# Patient Record
Sex: Male | Born: 1960
Health system: Southern US, Community
[De-identification: ages and names within clinical notes are randomized; demographics above are authoritative.]

## PROBLEM LIST (undated history)

## (undated) DIAGNOSIS — Z21 Asymptomatic human immunodeficiency virus [HIV] infection status: Secondary | ICD-10-CM

## (undated) DIAGNOSIS — I2510 Atherosclerotic heart disease of native coronary artery without angina pectoris: Secondary | ICD-10-CM

## (undated) DIAGNOSIS — N200 Calculus of kidney: Secondary | ICD-10-CM

## (undated) DIAGNOSIS — E785 Hyperlipidemia, unspecified: Secondary | ICD-10-CM

## (undated) DIAGNOSIS — IMO0002 Reserved for concepts with insufficient information to code with codable children: Secondary | ICD-10-CM

## (undated) DIAGNOSIS — G510 Bell's palsy: Secondary | ICD-10-CM

## (undated) DIAGNOSIS — N529 Male erectile dysfunction, unspecified: Secondary | ICD-10-CM

## (undated) DIAGNOSIS — I219 Acute myocardial infarction, unspecified: Secondary | ICD-10-CM

## (undated) DIAGNOSIS — C4491 Basal cell carcinoma of skin, unspecified: Secondary | ICD-10-CM

## (undated) HISTORY — DX: Bell's palsy: G51.0

## (undated) HISTORY — DX: Atherosclerotic heart disease of native coronary artery without angina pectoris: I25.10

## (undated) HISTORY — DX: Reserved for concepts with insufficient information to code with codable children: IMO0002

## (undated) HISTORY — PX: CARDIAC CATHETERIZATION: SHX172

## (undated) HISTORY — PX: OTHER SURGICAL HISTORY: SHX169

## (undated) HISTORY — DX: Basal cell carcinoma of skin, unspecified: C44.91

## (undated) HISTORY — DX: Asymptomatic human immunodeficiency virus (hiv) infection status: Z21

## (undated) HISTORY — DX: Hyperlipidemia, unspecified: E78.5

## (undated) HISTORY — DX: Calculus of kidney: N20.0

## (undated) HISTORY — DX: Male erectile dysfunction, unspecified: N52.9

---

## 1983-04-08 HISTORY — PX: AMPUTATION: SHX166

## 2001-01-05 DIAGNOSIS — IMO0002 Reserved for concepts with insufficient information to code with codable children: Secondary | ICD-10-CM

## 2001-01-05 HISTORY — DX: Reserved for concepts with insufficient information to code with codable children: IMO0002

## 2001-06-05 HISTORY — PX: CHOLECYSTECTOMY: SHX55

## 2002-04-07 DIAGNOSIS — I219 Acute myocardial infarction, unspecified: Secondary | ICD-10-CM

## 2002-04-07 DIAGNOSIS — G510 Bell's palsy: Secondary | ICD-10-CM

## 2002-04-07 HISTORY — DX: Bell's palsy: G51.0

## 2002-04-07 HISTORY — DX: Acute myocardial infarction, unspecified: I21.9

## 2003-04-17 ENCOUNTER — Other Ambulatory Visit: Payer: Self-pay

## 2003-04-18 ENCOUNTER — Other Ambulatory Visit: Payer: Self-pay

## 2006-06-12 ENCOUNTER — Ambulatory Visit: Payer: Self-pay | Admitting: Internal Medicine

## 2007-09-10 ENCOUNTER — Ambulatory Visit: Payer: Self-pay | Admitting: Internal Medicine

## 2007-09-10 ENCOUNTER — Other Ambulatory Visit: Payer: Self-pay

## 2007-12-23 ENCOUNTER — Ambulatory Visit: Payer: Self-pay | Admitting: Specialist

## 2008-04-21 ENCOUNTER — Ambulatory Visit: Payer: Self-pay | Admitting: Internal Medicine

## 2008-04-21 ENCOUNTER — Encounter: Payer: Self-pay | Admitting: Cardiovascular Disease

## 2008-07-17 ENCOUNTER — Ambulatory Visit: Payer: Self-pay | Admitting: Internal Medicine

## 2008-07-18 ENCOUNTER — Ambulatory Visit: Payer: Self-pay | Admitting: Internal Medicine

## 2008-07-18 ENCOUNTER — Encounter: Payer: Self-pay | Admitting: Cardiovascular Disease

## 2008-10-03 ENCOUNTER — Emergency Department: Payer: Self-pay | Admitting: Emergency Medicine

## 2009-05-12 ENCOUNTER — Ambulatory Visit: Payer: Self-pay | Admitting: Internal Medicine

## 2009-05-13 ENCOUNTER — Ambulatory Visit: Payer: Self-pay | Admitting: Internal Medicine

## 2009-09-21 ENCOUNTER — Ambulatory Visit: Payer: Self-pay | Admitting: Internal Medicine

## 2009-11-23 ENCOUNTER — Ambulatory Visit: Payer: Self-pay | Admitting: Cardiovascular Disease

## 2009-11-23 DIAGNOSIS — I2510 Atherosclerotic heart disease of native coronary artery without angina pectoris: Secondary | ICD-10-CM | POA: Insufficient documentation

## 2009-11-23 DIAGNOSIS — R079 Chest pain, unspecified: Secondary | ICD-10-CM | POA: Insufficient documentation

## 2009-11-23 DIAGNOSIS — E785 Hyperlipidemia, unspecified: Secondary | ICD-10-CM | POA: Insufficient documentation

## 2009-11-25 DIAGNOSIS — B20 Human immunodeficiency virus [HIV] disease: Secondary | ICD-10-CM | POA: Insufficient documentation

## 2010-01-20 ENCOUNTER — Ambulatory Visit: Payer: Self-pay | Admitting: Internal Medicine

## 2010-02-25 ENCOUNTER — Encounter: Payer: Self-pay | Admitting: Cardiovascular Disease

## 2010-02-26 ENCOUNTER — Encounter: Payer: Self-pay | Admitting: Cardiovascular Disease

## 2010-03-15 ENCOUNTER — Ambulatory Visit: Payer: Self-pay | Admitting: Cardiovascular Disease

## 2010-03-15 ENCOUNTER — Encounter: Payer: Self-pay | Admitting: Cardiovascular Disease

## 2010-04-25 ENCOUNTER — Encounter: Payer: Self-pay | Admitting: Cardiovascular Disease

## 2010-05-07 NOTE — Letter (Signed)
Summary: PHI  PHI   Imported By: Harlon Flor 11/26/2009 15:18:44  _____________________________________________________________________  External Attachment:    Type:   Image     Comment:   External Document

## 2010-05-07 NOTE — Cardiovascular Report (Signed)
[facility]   Imported By: Harlon Flor 11/26/2009 15:17:11  _____________________________________________________________________  External Attachment:    Type:   Image     Comment:   External Document

## 2010-05-07 NOTE — Miscellaneous (Signed)
Summary: Cardiac enzymes  Clinical Lists Changes  Orders: Added new Test order of T-CK Total [phone]) - Signed Added new Test order of T-CK MB Fract ([id number]) - Signed Added new Test order of T-Troponin I ([id number]) - Signed     Dr. Mariah Milling saw pt today in hospital, cardiac enzymes were normal.  Pt is now pale, diaphoretic, c/o chest pain. CK CK-MB, Troponin ordered and faxed to pt at [facility] and tosend results to Dr. Mariah Milling. Pt is an employee at [employer].  Appended Document: Cardiac enzymes Second set of cardiac enzymes negative. Dr. Mariah Milling spoke to pt. Dr. Mariah Milling instructed pt to increase fluids, rest, take Tylenol as needed for HA, if symptoms worsen to go to ED.

## 2010-05-07 NOTE — Miscellaneous (Signed)
Summary: Orders Update  Clinical Lists Changes  Orders: Added new Test order of T-Testosterone, Free and Total [phone]) - Signed

## 2010-05-07 NOTE — Assessment & Plan Note (Signed)
Summary: NEW PT   Visit Type:  Initial Consult Primary Provider:  Micheal E. Blocker, MD  CC:  c/o while riding his bike had some mid-sternum chest pain.Marland Kitchen  History of Present Illness: Daniel Black is a very pleasant 50 year old gentleman with coronary artery disease, MI in 2004 with PCI to the LAD and mid RCA at that time, PCI in 2010 495% mid RCA lesion, hyperlipidemia, HIV who is on antivirals with a strong family history of coronary artery disease who presents to establish care.  He states that currently he is doing well. He is an avid biker and exercises daily. He did have an episode of chest pain 2 weeks ago going up a hill and had to get off his bike and pushed the bike. He has been biking since then with no significant chest pain. He does not travel with nitroglycerin when he bikes. He episode of chest pain was not associated with diaphoresis or lightheadedness.  EKG shows normal sinus rhythm with rate 69 beats per minute, no significant ST or T wave changes.  Cardiac catheterization report from April 2010 details patent LAD stent, 30% distal LAD, 25% proximal left circumflex, 20% mid circumflex, 95% mid RCA, 40% distal RCA, 30% right PDA. Xience 3.5 x 28 mm DES stent placed to the mid RCA.  Stress test from January 2010 showed no ischemia.  Current Medications (verified): 1)  Crestor 40 Mg Tabs (Rosuvastatin Calcium) .... Take One Tablet By Mouth Daily. 2)  Atripla 600-200-300 Mg Tabs (Efavirenz-Emtricitab-Tenofovir) .Marland Kitchen.. 1 Tablet Daily 3)  Plavix 75 Mg Tabs (Clopidogrel Bisulfate) .... Take One Tablet By Mouth Daily 4)  Aspirin Ec 325 Mg Tbec (Aspirin) .... Take One Tablet By Mouth Daily 5)  Fish Oil 1000 Mg Caps (Omega-3 Fatty Acids) .Marland Kitchen.. 1 Tablet, Three Times A Day 6)  Flax Seed Oil 1000 Mg Caps (Flaxseed (Linseed)) .Marland Kitchen.. 1 Tablet Three Times A Day  Allergies (verified): 1)  ! Lipitor (Atorvastatin Calcium)  Past History:  Past Surgical History: Last updated:  11/19/2009 Amputation; left fingers reattachment 1985  Family History: Last updated: 11/23/2009 Family History of Coronary Artery Disease:  Father CABG x 3  Social History: Last updated: 11/23/2009 Single  Domestic Partner  Tobacco Use - Former.  Regular Exercise - yes--riding, walk & run. Alcohol Use - yes--occas. 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  Regular Exercise - yes--riding, walk & run. Alcohol Use - yes--occas. 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         remote chest pain 2 weeks ago, none since  Vital Signs:  Patient profile:   50 year old male Height:      70 inches Weight:      189 pounds BMI:     27.22 Pulse rate:   69 / minute BP sitting:   136 / 86  (right arm) Cuff size:   regular  Vitals Entered By: Jasmine December  Johnna Acosta, CMA (November 23, 2009 2:58 PM)  Physical Exam  General:  Well developed, well nourished, in no acute distress. Head:  normocephalic and atraumatic Neck:  Neck supple, no JVD. No masses, thyromegaly or abnormal cervical nodes. Lungs:  Clear bilaterally to auscultation and percussion. Heart:  Non-displaced PMI, chest non-tender; regular rate and rhythm, S1, S2 without murmurs,  rubs or gallops. Carotid upstroke normal, no bruit. Normal abdominal aortic size, no bruits. . Pedals normal pulses. No edema, no varicosities. Abdomen:   abdomen soft and non-tender without masses Msk:  Back normal, normal gait. Muscle strength and tone normal. Pulses:  pulses normal in all 4 extremities Extremities:  No clubbing or cyanosis. Neurologic:  Alert and oriented x 3. Skin:  Intact without lesions or rashes. Psych:  Normal affect.   Impression & Recommendations:  Problem # 1:  CAD (ICD-414.00) recent episode of chest pain with exertion 2 weeks ago though none since then. We will give him nitroglycerin to travel with. We have also suggested that if he continues to have chest pain, that he contact us for further evaluation. If he has symptoms concerning for coronary artery disease, we would likely suggest to perform a cardiac catheterization as his stress test did not pick up his severe lesion in his RCA several months prior to intervention.  His updated medication list for this problem includes:    Plavix 75 Mg Tabs (Clopidogrel bisulfate) .Marland Kitchen... Take one tablet by mouth daily    Aspirin Ec 325 Mg Tbec (Aspirin) .Marland Kitchen... Take one tablet by mouth daily  Orders: EKG w/ Interpretation (93000)  Problem # 2:  HYPERLIPIDEMIA-MIXED (ICD-272.4) We'll continue aggressive lipid management. Currently he is at goal.  His updated medication list for this problem includes:    Crestor 40 Mg Tabs (Rosuvastatin calcium) .Marland Kitchen... Take one tablet by mouth daily.  Problem # 3:  HIV INFECTION (ICD-042) HIV management I. Dr. Leavy Cella. Notes indicate CD4 count 950 and viral load 230 in February of this year. No significant opportunistic infections.

## 2010-05-07 NOTE — Letter (Signed)
Summary: Stent Implantation Card  Stent Implantation Card   Imported By: Harlon Flor 11/26/2009 09:47:56  _____________________________________________________________________  External Attachment:    Type:   Image     Comment:   External Document

## 2010-05-09 NOTE — Miscellaneous (Signed)
Summary: Rx Refill  Clinical Lists Changes  Medications: Changed medication from EFFIENT 10 MG TABS (PRASUGREL HCL) one tablet once daily to PLAVIX 75 MG TABS (CLOPIDOGREL BISULFATE) Take one tablet by mouth daily - Signed Rx of CRESTOR 40 MG TABS (ROSUVASTATIN CALCIUM) Take one tablet by mouth daily.;  #90 x 4;  Signed;  Entered by: Lanny Hurst RN;  Authorized by: Dossie Arbour MD;  Method used: Electronically to [facility] [address], [address], [place], [place]  [address], Ph: [phone], Fax: [phone] Rx of PLAVIX 75 MG TABS (CLOPIDOGREL BISULFATE) Take one tablet by mouth daily;  #90 x 3;  Signed;  Entered by: Lanny Hurst RN;  Authorized by: Dossie Arbour MD;  Method used: Electronically to [facility] [address] [place], [place], [place]  [address], Ph: [phone], Fax: [phone]    Prescriptions: PLAVIX 75 MG TABS (CLOPIDOGREL BISULFATE) Take one tablet by mouth daily  #90 x 3   Entered by:   Lanny Hurst RN   Authorized by:   Dossie Arbour MD   Signed by:   Lanny Hurst RN on 04/25/2010   Method used:   Electronically to        [facility]* (retail)       [address]       [place], [place]  [address]       Ph: [phone]       Fax: [phone]   RxID:   [id number] CRESTOR 40 MG TABS (ROSUVASTATIN CALCIUM) Take one tablet by mouth daily.  #90 x 4   Entered by:   Lanny Hurst RN   Authorized by:   Dossie Arbour MD   Signed by:   Lanny Hurst RN on 04/25/2010   Method used:   Electronically to        [facility]* (retail)       [address]       [place], [place]  [address]       Ph: [phone]       Fax: [phone]   RxID:   [id number]

## 2010-10-18 ENCOUNTER — Encounter: Payer: Self-pay | Admitting: Cardiovascular Disease

## 2010-11-05 ENCOUNTER — Encounter: Payer: Self-pay | Admitting: Cardiovascular Disease

## 2010-11-12 ENCOUNTER — Ambulatory Visit (INDEPENDENT_AMBULATORY_CARE_PROVIDER_SITE_OTHER): Payer: PRIVATE HEALTH INSURANCE | Admitting: Cardiovascular Disease

## 2010-11-12 ENCOUNTER — Encounter: Payer: Self-pay | Admitting: Cardiovascular Disease

## 2010-11-12 DIAGNOSIS — R079 Chest pain, unspecified: Secondary | ICD-10-CM

## 2010-11-12 DIAGNOSIS — I2510 Atherosclerotic heart disease of native coronary artery without angina pectoris: Secondary | ICD-10-CM

## 2010-11-12 DIAGNOSIS — E785 Hyperlipidemia, unspecified: Secondary | ICD-10-CM

## 2010-11-12 NOTE — Assessment & Plan Note (Signed)
Currently with no symptoms of angina. No further workup at this time. Continue current medication regimen. 

## 2010-11-12 NOTE — Assessment & Plan Note (Signed)
Cholesterol is at goal on the current lipid regimen. No changes to the medications were made.  

## 2010-11-12 NOTE — Progress Notes (Signed)
Patient ID: Daniel Black, male    DOB: 1960-06-12, 50 y.o.   MRN: [id number]  HPI Comments: Daniel Black is a very pleasant 50 year old gentleman with Remote smoking history, coronary artery disease, MI in 2004 with PCI to the LAD and mid RCA at that time, PCI in 2010 of the mid RCA lesion, hyperlipidemia, HIV who is on antivirals with a strong family history of coronary artery disease who presents for routine follow up.   Daniel Black states that currently Daniel Black is doing well. Daniel Black is an avid biker and exercises daily. Daniel Black does have occasional episodes of dizziness if Daniel Black stands up too quickly. In the hospital today as Daniel Black was working, Daniel Black reported a systolic pressure in the 80s. Blood pressure is also low in the office. Daniel Black denies any significant dizziness. Daniel Black does have some fatigue. Daniel Black is not taking any blood pressure medications that could drop his blood pressure.   EKG shows normal sinus rhythm with rate 72 beats per minute, no significant ST or T wave changes.   Cardiac catheterization report from April 2010 details patent LAD stent, 30% distal LAD, 25% proximal left circumflex, 20% mid circumflex, 95% mid RCA, 40% distal RCA, 30% right PDA. Xience 3.5 x 28 mm DES stent placed to the mid RCA.      Outpatient Encounter Prescriptions as of 11/12/2010  Medication Sig Dispense Refill  . aspirin 325 MG EC tablet Take 325 mg by mouth daily.        . clopidogrel (PLAVIX) 75 MG tablet Take 75 mg by mouth daily.        Marland Kitchen efavirenz-emtrictabine-tenofovir (ATRIPLA) 600-200-300 MG per tablet Take 1 tablet by mouth daily.        . Flaxseed, Linseed, (FLAX SEED OIL) 1000 MG CAPS Take 1 capsule by mouth 3 (three) times daily.        . Omega-3 Fatty Acids (FISH OIL) 1000 MG CAPS Take 1 capsule by mouth 3 (three) times daily.        . rosuvastatin (CRESTOR) 40 MG tablet Take 40 mg by mouth daily.          Review of Systems  Constitutional: Negative.   HENT: Negative.   Eyes: Negative.   Respiratory: Negative.     Cardiovascular: Negative.   Gastrointestinal: Negative.   Musculoskeletal: Negative.   Skin: Negative.   Neurological: Negative.   Hematological: Negative.   Psychiatric/Behavioral: Negative.   All other systems reviewed and are negative.    BP 100/70  Pulse 72  Ht 5\' 11"  (1.803 m)  Wt 183 lb (83.008 kg)  BMI 25.52 kg/m2  Physical Exam  Nursing note and vitals reviewed. Constitutional: Daniel Black is oriented to person, place, and time. Daniel Black appears well-developed and well-nourished.  HENT:  Head: Normocephalic.  Nose: Nose normal.  Mouth/Throat: Oropharynx is clear and moist.  Eyes: Conjunctivae are normal. Pupils are equal, round, and reactive to light.  Neck: Normal range of motion. Neck supple. No JVD present.  Cardiovascular: Normal rate, regular rhythm, S1 normal, S2 normal, normal heart sounds and intact distal pulses.  Exam reveals no gallop and no friction rub.   No murmur heard. Pulmonary/Chest: Effort normal and breath sounds normal. No respiratory distress. Daniel Black has no wheezes. Daniel Black has no rales. Daniel Black exhibits no tenderness.  Abdominal: Soft. Bowel sounds are normal. Daniel Black exhibits no distension. There is no tenderness.  Musculoskeletal: Normal range of motion. Daniel Black exhibits no edema and no tenderness.  Lymphadenopathy:    Daniel Black has no  cervical adenopathy.  Neurological: Daniel Black is alert and oriented to person, place, and time. Coordination normal.  Skin: Skin is warm and dry. No rash noted. No erythema.  Psychiatric: Daniel Black has a normal mood and affect. His behavior is normal. Judgment and thought content normal.           Assessment and Plan

## 2010-11-12 NOTE — Patient Instructions (Signed)
You are doing well. No medication changes were made. Please call us if you have new issues that need to be addressed before your next appt.  We will call you for a follow up Appt. In 12 months  

## 2010-11-12 NOTE — Assessment & Plan Note (Signed)
No further episodes of chest pain. No further workup at this time 

## 2011-06-17 ENCOUNTER — Other Ambulatory Visit: Payer: Self-pay

## 2011-06-17 MED ORDER — ROSUVASTATIN CALCIUM 40 MG PO TABS: 40.0000 mg | ORAL_TABLET | Freq: Every day | ORAL | Status: DC

## 2011-06-17 NOTE — Telephone Encounter (Signed)
Refill sent for crestor.  

## 2011-10-06 HISTORY — PX: WRIST SURGERY: SHX841

## 2011-10-14 ENCOUNTER — Telehealth: Payer: Self-pay

## 2011-10-14 ENCOUNTER — Ambulatory Visit: Payer: Self-pay | Admitting: Specialist

## 2011-10-14 LAB — HEMOGLOBIN: HGB: 14.7 g/dL (ref 13.0–18.0)

## 2011-10-14 LAB — BASIC METABOLIC PANEL
Anion Gap: 6 — ABNORMAL LOW (ref 7–16)
BUN: 19 mg/dL — ABNORMAL HIGH (ref 7–18)
Chloride: 105 mmol/L (ref 98–107)
Co2: 29 mmol/L (ref 21–32)
Creatinine: 1.12 mg/dL (ref 0.60–1.30)
EGFR (African American): >60
Osmolality: 281 (ref 275–301)
Potassium: 3.9 mmol/L (ref 3.5–5.1)
Sodium: 140 mmol/L (ref 136–145)

## 2011-10-14 NOTE — Telephone Encounter (Signed)
Needs a cardiac clearance for surgery tomorrow. Patient has fractured left distal radius. Please advise what to do with medications wtih a letter sent regarding if cleared for surgery. Please fax to [phone].

## 2011-10-15 ENCOUNTER — Ambulatory Visit: Payer: Self-pay | Admitting: Specialist

## 2011-10-15 NOTE — Telephone Encounter (Signed)
Notified patient if having any chest pain, shortness of breath or any other discomforts. The patient states has been doing well going on hikes, riding his bicycle and being very active. He denies any chest pain/discomfort, some shortness of breath only when climbing up a steep hill. Per Dr. Mariah Milling okay to proceed with surgery. A letter sent to the surgeon for clearance.

## 2011-10-27 ENCOUNTER — Other Ambulatory Visit: Payer: Self-pay | Admitting: *Deleted

## 2011-10-27 MED ORDER — CLOPIDOGREL BISULFATE 75 MG PO TABS: 75.0000 mg | ORAL_TABLET | Freq: Every day | ORAL | Status: DC

## 2011-10-27 NOTE — Telephone Encounter (Signed)
Refilled Clopidogrel. 

## 2011-11-14 ENCOUNTER — Ambulatory Visit (INDEPENDENT_AMBULATORY_CARE_PROVIDER_SITE_OTHER): Payer: No Typology Code available for payment source | Admitting: Cardiovascular Disease

## 2011-11-14 ENCOUNTER — Encounter: Payer: Self-pay | Admitting: Cardiovascular Disease

## 2011-11-14 VITALS — BP 110/84 | HR 76 | Ht 70.0 in | Wt 187.8 lb

## 2011-11-14 DIAGNOSIS — E785 Hyperlipidemia, unspecified: Secondary | ICD-10-CM

## 2011-11-14 DIAGNOSIS — I2510 Atherosclerotic heart disease of native coronary artery without angina pectoris: Secondary | ICD-10-CM

## 2011-11-14 MED ORDER — CLOPIDOGREL BISULFATE 75 MG PO TABS: 75.0000 mg | ORAL_TABLET | Freq: Every day | ORAL | Status: DC

## 2011-11-14 MED ORDER — ROSUVASTATIN CALCIUM 40 MG PO TABS: 40.0000 mg | ORAL_TABLET | Freq: Every day | ORAL | Status: DC

## 2011-11-14 NOTE — Assessment & Plan Note (Signed)
Currently with no symptoms of angina. No further workup at this time. Continue current medication regimen. 

## 2011-11-14 NOTE — Assessment & Plan Note (Signed)
Cholesterol is at goal on the current lipid regimen. No changes to the medications were made.  

## 2011-11-14 NOTE — Progress Notes (Signed)
Patient ID: Daniel Black, male    DOB: Jul 08, 1960, 51 y.o.   MRN: [id number]  HPI Comments: Daniel Black is a very pleasant 51 year old gentleman with remote smoking history, coronary artery disease, MI in 2004 with PCI to the LAD and mid RCA at that time, PCI in 2010 of the mid RCA lesion, hyperlipidemia, HIV who is on antivirals with a strong family history of coronary artery disease who presents for routine follow up.   He states that currently he is doing well. He recently broke his left wrist and had surgery last month. He has been out of work. For exercise, he has been walking. No symptoms of chest pain or shortness of breath. He does have occasional episodes of dizziness if he stands up too quickly.   EKG shows normal sinus rhythm with rate 76 beats per minute, no significant ST or T wave changes.   Cardiac catheterization report from April 2010 details patent LAD stent, 30% distal LAD, 25% proximal left circumflex, 20% mid circumflex, 95% mid RCA, 40% distal RCA, 30% right PDA. Xience 3.5 x 28 mm DES stent placed to the mid RCA.      Outpatient Encounter Prescriptions as of 11/14/2011  Medication Sig Dispense Refill  . aspirin 325 MG EC tablet Take 325 mg by mouth daily.        . clopidogrel (PLAVIX) 75 MG tablet Take 1 tablet (75 mg total) by mouth daily.  90 tablet  3  . efavirenz-emtrictabine-tenofovir (ATRIPLA) 600-200-300 MG per tablet Take 1 tablet by mouth daily.        . Flaxseed, Linseed, (FLAX SEED OIL) 1000 MG CAPS Take 1 capsule by mouth 3 (three) times daily.        . rosuvastatin (CRESTOR) 40 MG tablet Take 1 tablet (40 mg total) by mouth daily.  90 tablet  3  . DISCONTD: Omega-3 Fatty Acids (FISH OIL) 1000 MG CAPS Take 1 capsule by mouth 3 (three) times daily.          Review of Systems  Constitutional: Negative.   HENT: Negative.   Eyes: Negative.   Respiratory: Negative.   Cardiovascular: Negative.   Gastrointestinal: Negative.   Musculoskeletal: Positive  for joint swelling and arthralgias.  Skin: Negative.   Neurological: Negative.   Hematological: Negative.   Psychiatric/Behavioral: Negative.   All other systems reviewed and are negative.    BP 110/84  Pulse 76  Ht 5\' 10"  (1.778 m)  Wt 187 lb 12 oz (85.163 kg)  BMI 26.94 kg/m2  Physical Exam  Nursing note and vitals reviewed. Constitutional: He is oriented to person, place, and time. He appears well-developed and well-nourished.  HENT:  Head: Normocephalic.  Nose: Nose normal.  Mouth/Throat: Oropharynx is clear and moist.  Eyes: Conjunctivae are normal. Pupils are equal, round, and reactive to light.  Neck: Normal range of motion. Neck supple. No JVD present.  Cardiovascular: Normal rate, regular rhythm, S1 normal, S2 normal, normal heart sounds and intact distal pulses.  Exam reveals no gallop and no friction rub.   No murmur heard. Pulmonary/Chest: Effort normal and breath sounds normal. No respiratory distress. He has no wheezes. He has no rales. He exhibits no tenderness.  Abdominal: Soft. Bowel sounds are normal. He exhibits no distension. There is no tenderness.  Musculoskeletal: Normal range of motion. He exhibits no edema and no tenderness.       Brace on his left arm  Lymphadenopathy:    He has no cervical adenopathy.  Neurological: He is alert and oriented to person, place, and time. Coordination normal.  Skin: Skin is warm and dry. No rash noted. No erythema.  Psychiatric: He has a normal mood and affect. His behavior is normal. Judgment and thought content normal.           Assessment and Plan

## 2011-11-14 NOTE — Patient Instructions (Addendum)
You are doing well. No medication changes were made.  Please call us if you have new issues that need to be addressed before your next appt.  Your physician wants you to follow-up in: 12 months.  You will receive a reminder letter in the mail two months in advance. If you don't receive a letter, please call our office to schedule the follow-up appointment. 

## 2012-09-27 ENCOUNTER — Ambulatory Visit (INDEPENDENT_AMBULATORY_CARE_PROVIDER_SITE_OTHER): Payer: 59 | Admitting: Cardiovascular Disease

## 2012-09-27 ENCOUNTER — Encounter: Payer: Self-pay | Admitting: Cardiovascular Disease

## 2012-09-27 VITALS — BP 100/68 | HR 74 | Ht 71.0 in | Wt 179.5 lb

## 2012-09-27 DIAGNOSIS — I2510 Atherosclerotic heart disease of native coronary artery without angina pectoris: Secondary | ICD-10-CM

## 2012-09-27 DIAGNOSIS — E785 Hyperlipidemia, unspecified: Secondary | ICD-10-CM

## 2012-09-27 MED ORDER — ATORVASTATIN CALCIUM 40 MG PO TABS: 40.0000 mg | ORAL_TABLET | Freq: Every day | ORAL | Status: DC

## 2012-09-27 NOTE — Progress Notes (Signed)
   Patient ID: Daniel Black, male    DOB: 1960-10-05, 52 y.o.   MRN: [id number]  HPI Comments: Daniel Black is a very pleasant 52 year old gentleman with remote smoking history, coronary artery disease, MI in 2004 with PCI to the LAD and mid RCA at that time, PCI in 2010 of the mid RCA lesion, hyperlipidemia, HIV who is on antivirals with a strong family history of coronary artery disease who presents for routine follow up.   Daniel Black states that currently Daniel Black is doing well. Daniel Black stopped his Crestor when this became unaffordable through the hospital system.   For exercise, Daniel Black has been walking. Daniel Black has lost over 10 pounds with new diet and exercise.   No symptoms of chest pain or shortness of breath. Daniel Black does have occasional episodes of dizziness if Daniel Black stands up too quickly.   EKG shows normal sinus rhythm with rate 76 beats per minute, no significant ST or T wave changes.  Weight has dropped 193 to 172 with working out and new diet Lab work from May 2014 shows total cholesterol 186, LDL 123, HDL 37. This was no cholesterol pill   Cardiac catheterization report from April 2010 details patent LAD stent, 30% distal LAD, 25% proximal left circumflex, 20% mid circumflex, 95% mid RCA, 40% distal RCA, 30% right PDA. Xience 3.5 x 28 mm DES stent placed to the mid RCA.      Outpatient Encounter Prescriptions as of 09/27/2012  Medication Sig Dispense Refill  . aspirin 325 MG EC tablet Take 325 mg by mouth daily.        Marland Kitchen efavirenz-emtrictabine-tenofovir (ATRIPLA) 600-200-300 MG per tablet Take 1 tablet by mouth daily.          Review of Systems  Constitutional: Negative.   HENT: Negative.   Eyes: Negative.   Respiratory: Negative.   Cardiovascular: Negative.   Gastrointestinal: Negative.   Musculoskeletal: Positive for joint swelling and arthralgias.  Skin: Negative.   Neurological: Negative.   Psychiatric/Behavioral: Negative.   All other systems reviewed and are negative.   BP 100/68  Pulse 74   Ht 5\' 11"  (1.803 m)  Wt 179 lb 8 oz (81.421 kg)  BMI 25.05 kg/m2  Physical Exam  Nursing note and vitals reviewed. Constitutional: Daniel Black is oriented to person, place, and time. Daniel Black appears well-developed and well-nourished.  HENT:  Head: Normocephalic.  Nose: Nose normal.  Mouth/Throat: Oropharynx is clear and moist.  Eyes: Conjunctivae are normal. Pupils are equal, round, and reactive to light.  Neck: Normal range of motion. Neck supple. No JVD present.  Cardiovascular: Normal rate, regular rhythm, S1 normal, S2 normal, normal heart sounds and intact distal pulses.  Exam reveals no gallop and no friction rub.   No murmur heard. Pulmonary/Chest: Effort normal and breath sounds normal. No respiratory distress. Daniel Black has no wheezes. Daniel Black has no rales. Daniel Black exhibits no tenderness.  Abdominal: Soft. Bowel sounds are normal. Daniel Black exhibits no distension. There is no tenderness.  Musculoskeletal: Normal range of motion. Daniel Black exhibits no edema and no tenderness.  Brace on his left arm  Lymphadenopathy:    Daniel Black has no cervical adenopathy.  Neurological: Daniel Black is alert and oriented to person, place, and time. Coordination normal.  Skin: Skin is warm and dry. No rash noted. No erythema.  Psychiatric: Daniel Black has a normal mood and affect. His behavior is normal. Judgment and thought content normal.      Assessment and Plan

## 2012-09-27 NOTE — Assessment & Plan Note (Signed)
Currently with no symptoms of angina. No further workup at this time. Continue current medication regimen. 

## 2012-09-27 NOTE — Assessment & Plan Note (Signed)
Cholesterol is high. We will start atorvastatin 20 mg daily for several weeks titrating up to 40 mg daily with cholesterol check in several months time.

## 2012-09-27 NOTE — Patient Instructions (Addendum)
You are doing well. Please start lipitor 20 mg daily for a few weeks  If no muscle aches, increase the dose to 40 mg daily  Please call us if you have new issues that need to be addressed before your next appt.  Your physician wants you to follow-up in: 12 months.  You will receive a reminder letter in the mail two months in advance. If you don't receive a letter, please call our office to schedule the follow-up appointment.

## 2012-10-13 ENCOUNTER — Encounter: Payer: Self-pay | Admitting: *Deleted

## 2013-02-10 ENCOUNTER — Other Ambulatory Visit: Payer: Self-pay

## 2013-03-10 ENCOUNTER — Ambulatory Visit: Payer: Self-pay | Admitting: Family Medicine

## 2013-03-10 LAB — CLOSTRIDIUM DIFFICILE(ARMC)

## 2013-03-12 LAB — STOOL CULTURE

## 2013-04-15 ENCOUNTER — Encounter: Payer: Self-pay | Admitting: Cardiovascular Disease

## 2013-04-15 ENCOUNTER — Ambulatory Visit (INDEPENDENT_AMBULATORY_CARE_PROVIDER_SITE_OTHER): Payer: 59 | Admitting: Cardiovascular Disease

## 2013-04-15 VITALS — BP 90/60 | HR 60 | Ht 71.0 in | Wt 156.0 lb

## 2013-04-15 DIAGNOSIS — E785 Hyperlipidemia, unspecified: Secondary | ICD-10-CM

## 2013-04-15 DIAGNOSIS — I2510 Atherosclerotic heart disease of native coronary artery without angina pectoris: Secondary | ICD-10-CM

## 2013-04-15 DIAGNOSIS — B20 Human immunodeficiency virus [HIV] disease: Secondary | ICD-10-CM

## 2013-04-15 NOTE — Assessment & Plan Note (Signed)
Currently on antivirals   

## 2013-04-15 NOTE — Patient Instructions (Signed)
You are doing well. No medication changes were made.   Please check labs next week, fasting  Please call us if you have new issues that need to be addressed before your next appt.  Your physician wants you to follow-up in: 6 months.  You will receive a reminder letter in the mail two months in advance. If you don't receive a letter, please call our office to schedule the follow-up appointment.

## 2013-04-15 NOTE — Progress Notes (Signed)
Patient ID: Daniel Black, male    DOB: [birth year], 53 y.o.   MRN: [id number]  HPI Comments: Daniel Black is a very pleasant 53 year old gentleman with remote smoking history, coronary artery disease, MI in 2004 with PCI to the LAD and mid RCA at that time, PCI in 2010 of the mid RCA lesion, hyperlipidemia, HIV who is on antivirals with a strong family history of coronary artery disease who presents for routine follow up.   He states that currently he is doing well. We'll he has lost significant weight over the past year. Previous He weight was in the 190 range, now 150.  is exercising at the gym 5 days per week at least with significant amounts of walking on other days. Reports his goal weight is 150 pounds which she has achieved. He is tolerating generic Lipitor 40 mg daily  No symptoms of chest pain or shortness of breath. He does have occasional episodes of dizziness if he stands up too quickly.   EKG shows normal sinus rhythm with rate 60 beats per minute, no significant ST or T wave changes.  Lab work from May 2014 shows total cholesterol 186, LDL 123, HDL 37. This was no cholesterol pill   Cardiac catheterization report from April 2010 details patent LAD stent, 30% distal LAD, 25% proximal left circumflex, 20% mid circumflex, 95% mid RCA, 40% distal RCA, 30% right PDA. Xience 3.5 x 28 mm DES stent placed to the mid RCA.      Outpatient Encounter Prescriptions as of 04/15/2013  Medication Sig  . aspirin 325 MG EC tablet Take 325 mg by mouth daily.    Marland Kitchen atorvastatin (LIPITOR) 40 MG tablet Take 1 tablet (40 mg total) by mouth daily.  Marland Kitchen efavirenz-emtrictabine-tenofovir (ATRIPLA) 600-200-300 MG per tablet Take 1 tablet by mouth daily.      Review of Systems  Constitutional: Negative.   HENT: Negative.   Eyes: Negative.   Respiratory: Negative.   Cardiovascular: Negative.   Gastrointestinal: Negative.   Endocrine: Negative.   Musculoskeletal: Positive for arthralgias and joint  swelling.  Skin: Negative.   Allergic/Immunologic: Negative.   Neurological: Negative.   Hematological: Negative.   Psychiatric/Behavioral: Negative.   All other systems reviewed and are negative.   BP 90/60  Pulse 60  Ht 5\' 11"  (1.803 m)  Wt 156 lb (70.761 kg)  BMI 21.77 kg/m2  Physical Exam  Nursing note and vitals reviewed. Constitutional: He is oriented to person, place, and time. He appears well-developed and well-nourished.  HENT:  Head: Normocephalic.  Nose: Nose normal.  Mouth/Throat: Oropharynx is clear and moist.  Eyes: Conjunctivae are normal. Pupils are equal, round, and reactive to light.  Neck: Normal range of motion. Neck supple. No JVD present.  Cardiovascular: Normal rate, regular rhythm, S1 normal, S2 normal, normal heart sounds and intact distal pulses.  Exam reveals no gallop and no friction rub.   No murmur heard. Pulmonary/Chest: Effort normal and breath sounds normal. No respiratory distress. He has no wheezes. He has no rales. He exhibits no tenderness.  Abdominal: Soft. Bowel sounds are normal. He exhibits no distension. There is no tenderness.  Musculoskeletal: Normal range of motion. He exhibits no edema and no tenderness.  Brace on his left arm  Lymphadenopathy:    He has no cervical adenopathy.  Neurological: He is alert and oriented to person, place, and time. Coordination normal.  Skin: Skin is warm and dry. No rash noted. No erythema.  Psychiatric: He has a normal  mood and affect. His behavior is normal. Judgment and thought content normal.      Assessment and Plan

## 2013-04-15 NOTE — Assessment & Plan Note (Signed)
Order placed for lipid panel and LFTs. Goal LDL less than 70

## 2013-04-15 NOTE — Assessment & Plan Note (Signed)
Currently with no symptoms of angina. No further workup at this time. Continue current medication regimen. 

## 2013-04-18 ENCOUNTER — Other Ambulatory Visit: Payer: Self-pay | Admitting: Cardiovascular Disease

## 2013-04-18 LAB — LIPID PANEL
Cholesterol: 144 mg/dL (ref 0–200)
HDL: 62 mg/dL — AB (ref 40–60)
Ldl Cholesterol, Calc: 70 mg/dL (ref 0–100)
Triglycerides: 61 mg/dL (ref 0–200)
VLDL Cholesterol, Calc: 12 mg/dL (ref 5–40)

## 2013-04-18 LAB — HEPATIC FUNCTION PANEL A (ARMC)
ALBUMIN: 3.9 g/dL (ref 3.4–5.0)
ALK PHOS: 87 U/L
BILIRUBIN TOTAL: 0.3 mg/dL (ref 0.2–1.0)
Bilirubin, Direct: 0.1 mg/dL (ref 0.00–0.20)
SGOT(AST): 36 U/L (ref 15–37)
SGPT (ALT): 35 U/L (ref 12–78)
TOTAL PROTEIN: 6.9 g/dL (ref 6.4–8.2)

## 2013-04-19 ENCOUNTER — Telehealth: Payer: Self-pay

## 2013-04-19 NOTE — Telephone Encounter (Signed)
Spoke w/ pt.  Reviewed preliminary lab results w/ him. Advised pt that I will call if Dr. Rockey Situ has further instructions for him.

## 2013-04-19 NOTE — Telephone Encounter (Signed)
Pt would like lab results.  

## 2014-04-24 ENCOUNTER — Encounter: Payer: Self-pay | Admitting: Cardiovascular Disease

## 2014-04-24 ENCOUNTER — Ambulatory Visit (INDEPENDENT_AMBULATORY_CARE_PROVIDER_SITE_OTHER): Payer: 59 | Admitting: Cardiovascular Disease

## 2014-04-24 VITALS — BP 80/60 | HR 54 | Ht 71.0 in | Wt 157.0 lb

## 2014-04-24 DIAGNOSIS — R0789 Other chest pain: Secondary | ICD-10-CM

## 2014-04-24 DIAGNOSIS — E785 Hyperlipidemia, unspecified: Secondary | ICD-10-CM

## 2014-04-24 DIAGNOSIS — I2510 Atherosclerotic heart disease of native coronary artery without angina pectoris: Secondary | ICD-10-CM

## 2014-04-24 DIAGNOSIS — B20 Human immunodeficiency virus [HIV] disease: Secondary | ICD-10-CM

## 2014-04-24 NOTE — Patient Instructions (Signed)
You are doing well. No medication changes were made.  We will check your labs, fasting, in the hospital  Please call us if you have new issues that need to be addressed before your next appt.  Your physician wants you to follow-up in: 12 months.  You will receive a reminder letter in the mail two months in advance. If you don't receive a letter, please call our office to schedule the follow-up appointment.

## 2014-04-24 NOTE — Assessment & Plan Note (Signed)
No symptoms concerning for angina

## 2014-04-24 NOTE — Assessment & Plan Note (Signed)
Currently with no symptoms of angina. No further workup at this time. Continue current medication regimen. 

## 2014-04-24 NOTE — Assessment & Plan Note (Signed)
We have ordered repeat lipids. He has stopped the Lipitor on his own. Goal LDL less than 70

## 2014-04-24 NOTE — Progress Notes (Signed)
Patient ID: Daniel Black, male    DOB: [birth year], 54 y.o.   MRN: [id number]  HPI Comments: Daniel Black is a very pleasant 54 year old gentleman with remote smoking history, coronary artery disease, MI in 2004 with PCI to the LAD and mid RCA at that time, PCI in 2010 of the mid RCA lesion, hyperlipidemia, HIV who is on antivirals with a strong family history of coronary artery disease who presents for routine follow up of his coronary artery disease.   He states that currently he is doing well.   Weight is stable at 157 pounds. He continues to work out 5 days at the gym  He stopped the Lipitor on his own  He denies any significant chest pain or shortness of breath.  Blood pressure running low, denies having any symptoms   EKG and today's visit shows normal sinus rhythm with no significant ST or T-wave changes    Cardiac catheterization report from April 2010 details patent LAD stent, 30% distal LAD, 25% proximal left circumflex, 20% mid circumflex, 95% mid RCA, 40% distal RCA, 30% right PDA. Xience 3.5 x 28 mm DES stent placed to the mid RCA.     No Known Allergies  Outpatient Encounter Prescriptions as of 04/24/2014  Medication Sig  . aspirin 325 MG EC tablet Take 325 mg by mouth daily.    Marland Kitchen efavirenz-emtrictabine-tenofovir (ATRIPLA) 600-200-300 MG per tablet Take 1 tablet by mouth daily.    . [DISCONTINUED] atorvastatin (LIPITOR) 40 MG tablet Take 1 tablet (40 mg total) by mouth daily. (Patient not taking: Reported on 04/24/2014)    Past Medical History  Diagnosis Date  . HLD (hyperlipidemia)     mixed  . Asymptomatic HIV infection   . Bell's palsy 2004  . DDD (degenerative disc disease) 10/02    has lumbar fusion  . Nephrolithiasis   . CAD (coronary artery disease)     MI - stent palcement; stress myoview 4/10, repeat stent 4/10   . Skin cancer, basal cell     Past Surgical History  Procedure Laterality Date  . Cholecystectomy  3/03  . Amputation  1985    L  fingers reattachement   . Wrist surgery  10/2011    Social History  reports that he has quit smoking. He does not have any smokeless tobacco history on file. He reports that he drinks alcohol. He reports that he does not use illicit drugs.  Family History family history includes Coronary artery disease in an other family member; Heart disease in his father.   Review of Systems  Constitutional: Negative.   Respiratory: Negative.   Cardiovascular: Negative.   Gastrointestinal: Negative.   Musculoskeletal: Positive for joint swelling and arthralgias.  Skin: Negative.   Neurological: Negative.   Hematological: Negative.   Psychiatric/Behavioral: Negative.   All other systems reviewed and are negative.  BP 80/60 mmHg  Pulse 54  Ht 5\' 11"  (1.803 m)  Wt 157 lb (71.215 kg)  BMI 21.91 kg/m2  Physical Exam  Constitutional: He is oriented to person, place, and time. He appears well-developed and well-nourished.  HENT:  Head: Normocephalic.  Nose: Nose normal.  Mouth/Throat: Oropharynx is clear and moist.  Eyes: Conjunctivae are normal. Pupils are equal, round, and reactive to light.  Neck: Normal range of motion. Neck supple. No JVD present.  Cardiovascular: Normal rate, regular rhythm, S1 normal, S2 normal, normal heart sounds and intact distal pulses.  Exam reveals no gallop and no friction rub.   No murmur  heard. Pulmonary/Chest: Effort normal and breath sounds normal. No respiratory distress. He has no wheezes. He has no rales. He exhibits no tenderness.  Abdominal: Soft. Bowel sounds are normal. He exhibits no distension. There is no tenderness.  Musculoskeletal: Normal range of motion. He exhibits no edema or tenderness.  Brace on his left arm  Lymphadenopathy:    He has no cervical adenopathy.  Neurological: He is alert and oriented to person, place, and time. Coordination normal.  Skin: Skin is warm and dry. No rash noted. No erythema.  Psychiatric: He has a normal mood and  affect. His behavior is normal. Judgment and thought content normal.      Assessment and Plan   Nursing note and vitals reviewed.

## 2014-04-24 NOTE — Assessment & Plan Note (Signed)
Currently on antivirals

## 2014-04-25 ENCOUNTER — Ambulatory Visit: Payer: Self-pay | Admitting: Cardiovascular Disease

## 2014-04-25 ENCOUNTER — Encounter: Payer: Self-pay | Admitting: Cardiovascular Disease

## 2014-04-25 LAB — LIPID PANEL
CHOLESTEROL: 167 mg/dL (ref 0–200)
HDL: 53 mg/dL (ref 40–60)
Ldl Cholesterol, Calc: 93 mg/dL (ref 0–100)
TRIGLYCERIDES: 106 mg/dL (ref 0–200)
VLDL Cholesterol, Calc: 21 mg/dL (ref 5–40)

## 2014-04-25 LAB — HEPATIC FUNCTION PANEL A (ARMC)
Albumin: 3.8 g/dL (ref 3.4–5.0)
Alkaline Phosphatase: 96 U/L
Bilirubin, Direct: <0.1 mg/dL (ref 0.0–0.2)
Bilirubin,Total: 0.2 mg/dL (ref 0.2–1.0)
SGOT(AST): 34 U/L (ref 15–37)
SGPT (ALT): 28 U/L
Total Protein: 6.9 g/dL (ref 6.4–8.2)

## 2014-05-02 ENCOUNTER — Other Ambulatory Visit: Payer: Self-pay

## 2014-05-02 MED ORDER — ATORVASTATIN CALCIUM 20 MG PO TABS: 20.0000 mg | ORAL_TABLET | Freq: Every day | ORAL | Status: DC

## 2014-07-21 ENCOUNTER — Other Ambulatory Visit
Admit: 2014-07-21 | Disposition: A | Discharge status: Home / Self Care | Payer: Self-pay | Attending: Infectious Diseases | Admitting: Infectious Diseases

## 2014-07-21 LAB — COMPREHENSIVE METABOLIC PANEL
ALBUMIN: 4.1 g/dL
ALK PHOS: 90 U/L
ALT: 19 U/L
AST: 24 U/L
Anion Gap: 6 — ABNORMAL LOW (ref 7–16)
BUN: 29 mg/dL — ABNORMAL HIGH
Bilirubin,Total: 0.2 mg/dL — ABNORMAL LOW
CHLORIDE: 105 mmol/L
Calcium, Total: 8.8 mg/dL — ABNORMAL LOW
Co2: 28 mmol/L
Creatinine: 1.02 mg/dL
EGFR (African American): >60
EGFR (Non-African Amer.): >60
Glucose: 134 mg/dL — ABNORMAL HIGH
Potassium: 3.9 mmol/L
SODIUM: 139 mmol/L
Total Protein: 6.8 g/dL

## 2014-07-21 LAB — CBC WITH DIFFERENTIAL/PLATELET
Basophil #: 0 10*3/uL (ref 0.0–0.1)
Basophil %: 0.6 %
EOS ABS: 0.1 10*3/uL (ref 0.0–0.7)
EOS PCT: 2 %
HCT: 40.8 % (ref 40.0–52.0)
HGB: 13.5 g/dL (ref 13.0–18.0)
Lymphocyte #: 1.4 10*3/uL (ref 1.0–3.6)
Lymphocyte %: 28.7 %
MCH: 32.7 pg (ref 26.0–34.0)
MCHC: 33.1 g/dL (ref 32.0–36.0)
MCV: 99 fL (ref 80–100)
Monocyte #: 0.3 x10 3/mm (ref 0.2–1.0)
Monocyte %: 5.8 %
NEUTROS PCT: 62.9 %
Neutrophil #: 3 10*3/uL (ref 1.4–6.5)
PLATELETS: 194 10*3/uL (ref 150–440)
RBC: 4.14 10*6/uL — ABNORMAL LOW (ref 4.40–5.90)
RDW: 12.8 % (ref 11.5–14.5)
WBC: 4.8 10*3/uL (ref 3.8–10.6)

## 2014-07-30 NOTE — Op Note (Signed)
PATIENT NAME:  Daniel Black, Daniel Black MR#:  [id number] DATE OF BIRTH:  [birth year]  DATE OF PROCEDURE:  10/15/2011  DATE OF PROCEDURE: 10/15/2011.   PREOPERATIVE DIAGNOSES:  1. Comminuted intra-articular distal left radius fracture.  2. Carpal tunnel syndrome.   POSTOPERATIVE DIAGNOSES:  1. Comminuted intra-articular distal left radius fracture.  2. Carpal tunnel syndrome.   PROCEDURES:  1. Open reduction internal fixation comminuted intra-articular distal left radius fracture.  2. Left carpal tunnel release.   SURGEON: Christophe Louis, MD   ANESTHESIA: General.   COMPLICATIONS: None.   TOURNIQUET TIME: 78 minutes.   DESCRIPTION OF PROCEDURE: One gram of Ancef was given intravenously prior to the procedure. General anesthesia is induced. The left upper extremity is thoroughly prepped with alcohol and ChloraPrep and draped in standard sterile fashion. The extremity is wrapped out with the Esmarch bandage and the pneumatic tourniquet elevated to 250 mmHg. Under loupe magnification, standard volar carpal tunnel incision is made and the dissection carried down to the transverse retinacular ligament. This is incised in the midportion using the knife. The distal release is performed with the small scissors. The proximal release is performed with the small scissors and the carpal tunnel scissors. The wound is thoroughly irrigated multiple times. The skin edges are infiltrated with 0.5% plain Marcaine. The skin is closed with 4-0 nylon. A longitudinal incision is made over the distal volar forearm along the line of the flexor carpi radialis tendon. The dissection is carried down to the tendon and the sheath incised. The flexor carpi radialis tendon is then reflected ulnarly along with the median nerve, and the median nerve was protected throughout the case, and the ulnar vessels and the radial vessels are reflected radially. Dissection is carried down to the pronator muscle which is incised under direct  vision, and a periosteal elevator is used to reveal the distal radius and the fracture. The fracture is  evacuated of all clot. The fingers were then placed in the finger traps with 10 pounds longitudinal traction. The fracture is manually reduced and checked in the AP and lateral views using the FluoroScan and seen to be essentially anatomic. [employer] plate is then applied and fixed first with the cortical screws proximally and the distal cortical screws distally. Final position of the plate in the internal fixation and fracture reduction is checked in the AP and lateral views and is seen to be essentially anatomic. The wound is thoroughly irrigated multiple times. Subcutaneous tissue is closed with 3-0 Vicryl. The skin is closed with the skin stapler. A soft bulky dressing is applied with a volar splint. The tourniquet is released with the arm elevated, and the patient is returned to the recovery room in satisfactory condition having tolerated the procedure quite well.  ____________________________ Lucas Mallow, MD ces:cbb D: 10/15/2011 15:02:26 ET T: 10/15/2011 15:59:31 ET JOB#: [id number]  cc: Lucas Mallow, MD, <Dictator> Lucas Mallow MD ELECTRONICALLY SIGNED 10/16/2011 7:31

## 2015-04-20 ENCOUNTER — Other Ambulatory Visit
Admission: RE | Admit: 2015-04-20 | Discharge: 2015-04-20 | Disposition: A | Discharge status: Home / Self Care | Payer: 59 | Source: Ambulatory Visit | Attending: Infectious Diseases | Admitting: Infectious Diseases

## 2015-04-20 DIAGNOSIS — Z21 Asymptomatic human immunodeficiency virus [HIV] infection status: Secondary | ICD-10-CM | POA: Diagnosis not present

## 2015-04-20 DIAGNOSIS — I2510 Atherosclerotic heart disease of native coronary artery without angina pectoris: Secondary | ICD-10-CM | POA: Diagnosis not present

## 2015-04-20 DIAGNOSIS — E782 Mixed hyperlipidemia: Secondary | ICD-10-CM | POA: Diagnosis not present

## 2015-04-20 LAB — LIPID PANEL
CHOL/HDL RATIO: 2.5 ratio
CHOLESTEROL: 160 mg/dL (ref 0–200)
HDL: 63 mg/dL (ref >40)
LDL CALC: 82 mg/dL (ref 0–99)
TRIGLYCERIDES: 73 mg/dL (ref <150)
VLDL: 15 mg/dL (ref 0–40)

## 2015-04-20 LAB — COMPREHENSIVE METABOLIC PANEL
ALT: 21 U/L (ref 17–63)
AST: 25 U/L (ref 15–41)
Albumin: 4.3 g/dL (ref 3.5–5.0)
Alkaline Phosphatase: 96 U/L (ref 38–126)
Anion gap: 2 — ABNORMAL LOW (ref 5–15)
BUN: 26 mg/dL — AB (ref 6–20)
CHLORIDE: 106 mmol/L (ref 101–111)
CO2: 30 mmol/L (ref 22–32)
CREATININE: 1.16 mg/dL (ref 0.61–1.24)
Calcium: 9.1 mg/dL (ref 8.9–10.3)
GFR calc Af Amer: >60 mL/min (ref >60)
Glucose, Bld: 96 mg/dL (ref 65–99)
POTASSIUM: 4.6 mmol/L (ref 3.5–5.1)
Sodium: 138 mmol/L (ref 135–145)
Total Bilirubin: 0.6 mg/dL (ref 0.3–1.2)
Total Protein: 7.2 g/dL (ref 6.5–8.1)

## 2015-04-21 LAB — RPR: RPR: NONREACTIVE

## 2015-04-23 LAB — T-HELPER CELLS CD4/CD8 %
% CD 4 Pos. Lymph.: 46 % (ref 30.8–58.5)
Absolute CD 4 Helper: 552 /uL (ref 359–1519)
BASOS ABS: 0 10*3/uL (ref 0.0–0.2)
Basos: 0 %
CD3+CD4+ Cells/CD3+CD8+ Cells Bld: 1.46 (ref 0.92–3.72)
CD3+CD8+ CELLS # BLD: 379 /uL (ref 109–897)
CD3+CD8+ CELLS NFR BLD: 31.6 % (ref 12.0–35.5)
EOS (ABSOLUTE): 0.1 10*3/uL (ref 0.0–0.4)
EOS: 2 %
HEMATOCRIT: 43.8 % (ref 37.5–51.0)
Hemoglobin: 15.1 g/dL (ref 12.6–17.7)
IMMATURE GRANULOCYTES: 0 %
Immature Grans (Abs): 0 10*3/uL (ref 0.0–0.1)
LYMPHS: 28 %
Lymphocytes Absolute: 1.2 10*3/uL (ref 0.7–3.1)
MCH: 32.6 pg (ref 26.6–33.0)
MCHC: 34.5 g/dL (ref 31.5–35.7)
MCV: 95 fL (ref 79–97)
MONOCYTES: 6 %
Monocytes Absolute: 0.3 10*3/uL (ref 0.1–0.9)
NEUTROS PCT: 64 %
Neutrophils Absolute: 2.8 10*3/uL (ref 1.4–7.0)
PLATELETS: 247 10*3/uL (ref 150–379)
RBC: 4.63 x10E6/uL (ref 4.14–5.80)
RDW: 13.5 % (ref 12.3–15.4)
WBC: 4.4 10*3/uL (ref 3.4–10.8)

## 2015-04-23 LAB — MISC LABCORP TEST (SEND OUT): LABCORP TEST CODE: 550420

## 2015-04-26 ENCOUNTER — Ambulatory Visit (INDEPENDENT_AMBULATORY_CARE_PROVIDER_SITE_OTHER): Payer: 59 | Admitting: Cardiovascular Disease

## 2015-04-26 ENCOUNTER — Encounter: Payer: Self-pay | Admitting: Cardiovascular Disease

## 2015-04-26 VITALS — BP 108/78 | HR 69 | Ht 71.0 in | Wt 168.5 lb

## 2015-04-26 DIAGNOSIS — B20 Human immunodeficiency virus [HIV] disease: Secondary | ICD-10-CM | POA: Diagnosis not present

## 2015-04-26 DIAGNOSIS — I2510 Atherosclerotic heart disease of native coronary artery without angina pectoris: Secondary | ICD-10-CM | POA: Diagnosis not present

## 2015-04-26 DIAGNOSIS — E785 Hyperlipidemia, unspecified: Secondary | ICD-10-CM | POA: Diagnosis not present

## 2015-04-26 MED ORDER — EZETIMIBE 10 MG PO TABS: 10.0000 mg | ORAL_TABLET | Freq: Every day | ORAL | Status: DC

## 2015-04-26 NOTE — Assessment & Plan Note (Signed)
Currently with no symptoms of angina. No further workup at this time. Continue current medication regimen. 

## 2015-04-26 NOTE — Progress Notes (Signed)
Patient ID: Daniel Black, male    DOB: 1961/01/08, 55 y.o.   MRN: [id number]  HPI Comments: Daniel Black is a very pleasant 55 year old gentleman with remote smoking history, coronary artery disease, MI in 2004 with PCI to the LAD and mid RCA at that time, PCI in 2010 of the mid RCA lesion, hyperlipidemia, HIV who is on antivirals with a strong family history of coronary artery disease who presents for routine follow up of his coronary artery disease.  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  In follow-up today, weight has trended higher, blood pressure improved Reports that he is having difficulty losing weight, perhaps eating more  review of lab work with him shows total cholesterol 160, LDL 80. He is tolerating Lipitor 20 mg daily  EKG on today's visit shows normal sinus rhythm with rate 69 bpm, no significant ST or T-wave changes  Other past medical history  Cardiac catheterization report from April 2010 details patent LAD stent, 30% distal LAD, 25% proximal left circumflex, 20% mid circumflex, 95% mid RCA, 40% distal RCA, 30% right PDA. Xience 3.5 x 28 mm DES stent placed to the mid RCA.     No Known Allergies  Outpatient Encounter Prescriptions as of 55/19/2017  Medication Sig  . aspirin 325 MG EC tablet Take 325 mg by mouth daily.    Marland Kitchen atorvastatin (LIPITOR) 20 MG tablet Take 1 tablet (20 mg total) by mouth daily.  Marland Kitchen efavirenz-emtrictabine-tenofovir (ATRIPLA) 600-200-300 MG per tablet Take 1 tablet by mouth daily.    Marland Kitchen ezetimibe (ZETIA) 10 MG tablet Take 1 tablet (10 mg total) by mouth daily.   No facility-administered encounter medications on file as of 04/26/2015.    Past Medical History  Diagnosis Date  . HLD (hyperlipidemia)     mixed  . Asymptomatic HIV infection  ([place])   . Bell's palsy 2004  . DDD (degenerative disc disease) 10/02    has lumbar fusion  . Nephrolithiasis   . CAD (coronary artery disease)     MI - stent palcement; stress myoview 4/10, repeat stent 4/10   . Skin cancer, basal cell     Past Surgical History  Procedure Laterality Date  . Cholecystectomy  3/03  . Amputation  1985    L fingers reattachement   . Wrist surgery  10/2011    Social History  reports that he has quit smoking. He does not have any smokeless tobacco history on file. He reports that he does not drink alcohol or use illicit drugs.  Family History family history includes Heart disease in his father.   Review of Systems  Constitutional: Negative.   Respiratory: Negative.   Cardiovascular: Negative.   Gastrointestinal: Negative.   Musculoskeletal: Negative.   Skin: Negative.   Neurological: Negative.   Hematological: Negative.   Psychiatric/Behavioral: Negative.   All other systems reviewed and are negative.  BP 108/78 mmHg  Pulse 69  Ht 5\' 11"  (1.803 m)  Wt 168 lb 8 oz (76.431 kg)  BMI 23.51 kg/m2  Physical Exam  Constitutional: He is oriented to person, place, and time. He appears well-developed and well-nourished.  HENT:  Head: Normocephalic.  Nose: Nose normal.  Mouth/Throat: Oropharynx is clear and moist.  Eyes: Conjunctivae are normal. Pupils are equal, round, and  reactive to light.  Neck: Normal range of motion. Neck supple. No JVD present.  Cardiovascular: Normal rate, regular rhythm, S1 normal, S2 normal, normal heart sounds and intact distal pulses.  Exam reveals no gallop and no friction rub.   No murmur heard. Pulmonary/Chest: Effort normal and breath sounds normal. No respiratory distress. He has no wheezes. He has no rales. He exhibits no tenderness.  Abdominal: Soft. Bowel sounds are normal. He exhibits no distension. There is no tenderness.  Musculoskeletal: Normal range of motion. He exhibits no edema or tenderness.  Brace  on his left arm  Lymphadenopathy:    He has no cervical adenopathy.  Neurological: He is alert and oriented to person, place, and time. Coordination normal.  Skin: Skin is warm and dry. No rash noted. No erythema.  Psychiatric: He has a normal mood and affect. His behavior is normal. Judgment and thought content normal.      Assessment and Plan   Nursing note and vitals reviewed.

## 2015-04-26 NOTE — Assessment & Plan Note (Signed)
Recommended that he stay on his Lipitor, in addition would add Zetia 10 mg daily for goal LDL less than 70

## 2015-04-26 NOTE — Assessment & Plan Note (Signed)
Stable, managed by Dr. Ola Spurr

## 2015-04-26 NOTE — Patient Instructions (Addendum)
You are doing well.  Please start zetia one a day with lipitor 20 mg daily for cholesterol  Please call us if you have new issues that need to be addressed before your next appt.  Your physician wants you to follow-up in: 12 months.  You will receive a reminder letter in the mail two months in advance. If you don't receive a letter, please call our office to schedule the follow-up appointment.

## 2015-04-27 ENCOUNTER — Other Ambulatory Visit: Payer: Self-pay | Admitting: Cardiovascular Disease

## 2015-06-04 ENCOUNTER — Encounter: Payer: Self-pay | Admitting: Dietician

## 2015-06-04 ENCOUNTER — Encounter: Payer: 59 | Attending: Infectious Diseases | Admitting: Dietician

## 2015-06-04 VITALS — Ht 71.0 in | Wt 163.8 lb

## 2015-06-04 DIAGNOSIS — E441 Mild protein-calorie malnutrition: Secondary | ICD-10-CM

## 2015-06-04 DIAGNOSIS — Z713 Dietary counseling and surveillance: Secondary | ICD-10-CM | POA: Insufficient documentation

## 2015-06-04 NOTE — Progress Notes (Signed)
Notes from [employer] [facility] [employer] employee "self referral" nutrition session: Start time: 1410   End time: 52  Met with employee to discuss his/her nutritional concerns and diet history.  Diet history: 6-8am eggs or protein shake, [place] sausage, heart bar (plant sterols) 9:30 protein snack yogurt or bar 11am lunch sandwich, soup, variety 2/27 chow mein 3pm fruit Supper: frozen meal michelina with fruit, veg, or salad, or out 9pm protein bar  Exercise: 60-90 minutes on treadmill daily, with backpack, sometimes additional hiking at home.   The employee's questions/concerns were also addressed.  We discussed the following topics:  Healthy Eating  Exercise   I also provided the following handouts as reinforcement of the educational session:  Planning a Balanced Meal with 2300kcal meal plan for weight maintenance  DASH diet and ways to implement  Additional Comments:  Patient is tracking food intake using app from [employer]. He reports taking in an average of 190g protein daily.   Advised 145g protein daily based on 25% of calories.   Discussed healthy carb choices to boost carbohydrate intake.   Goals Agreed Upon: 1. Patient will include average of 60g carbohydrate with meals.  2. Patient will control protein intake.   3. Encouraged patient to call as needed with any questions or concerns.

## 2015-07-27 ENCOUNTER — Encounter: Payer: Self-pay | Admitting: Emergency Medicine

## 2015-07-27 ENCOUNTER — Emergency Department
Admission: EM | Admit: 2015-07-27 | Discharge: 2015-07-27 | Disposition: A | Discharge status: Home / Self Care | Payer: PRIVATE HEALTH INSURANCE | Attending: Emergency Medicine | Admitting: Emergency Medicine

## 2015-07-27 DIAGNOSIS — Z79899 Other long term (current) drug therapy: Secondary | ICD-10-CM | POA: Diagnosis not present

## 2015-07-27 DIAGNOSIS — Y9389 Activity, other specified: Secondary | ICD-10-CM | POA: Insufficient documentation

## 2015-07-27 DIAGNOSIS — I2510 Atherosclerotic heart disease of native coronary artery without angina pectoris: Secondary | ICD-10-CM | POA: Diagnosis not present

## 2015-07-27 DIAGNOSIS — E785 Hyperlipidemia, unspecified: Secondary | ICD-10-CM | POA: Insufficient documentation

## 2015-07-27 DIAGNOSIS — X500XXA Overexertion from strenuous movement or load, initial encounter: Secondary | ICD-10-CM | POA: Insufficient documentation

## 2015-07-27 DIAGNOSIS — Z87891 Personal history of nicotine dependence: Secondary | ICD-10-CM | POA: Insufficient documentation

## 2015-07-27 DIAGNOSIS — Z85828 Personal history of other malignant neoplasm of skin: Secondary | ICD-10-CM | POA: Insufficient documentation

## 2015-07-27 DIAGNOSIS — M79661 Pain in right lower leg: Secondary | ICD-10-CM | POA: Diagnosis present

## 2015-07-27 DIAGNOSIS — Y990 Civilian activity done for income or pay: Secondary | ICD-10-CM | POA: Diagnosis not present

## 2015-07-27 DIAGNOSIS — S86811A Strain of other muscle(s) and tendon(s) at lower leg level, right leg, initial encounter: Secondary | ICD-10-CM | POA: Diagnosis not present

## 2015-07-27 DIAGNOSIS — Z7982 Long term (current) use of aspirin: Secondary | ICD-10-CM | POA: Insufficient documentation

## 2015-07-27 DIAGNOSIS — S86911A Strain of unspecified muscle(s) and tendon(s) at lower leg level, right leg, initial encounter: Secondary | ICD-10-CM | POA: Diagnosis not present

## 2015-07-27 DIAGNOSIS — Y929 Unspecified place or not applicable: Secondary | ICD-10-CM | POA: Diagnosis not present

## 2015-07-27 NOTE — ED Provider Notes (Signed)
[facility] Emergency Department Provider Note  ____________________________________________  Time seen: On arrival  I have reviewed the triage vital signs and the nursing notes.   HISTORY  Chief Complaint Leg Pain    HPI KELEN RYALS is a 55 y.o. male who presents with complaints of posterior right calf pain. 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 This occurred at approximately 10 PM on April 20. He has been able to ambulate since then but was told he needed to come get checked out. He reports soreness in his calf and worse with flexion at the ankle    Past Medical History  Diagnosis Date  . HLD (hyperlipidemia)     mixed  . Asymptomatic HIV infection ([place])   . Bell's palsy 2004  . DDD (degenerative disc disease) 10/02    has lumbar fusion  . Nephrolithiasis   . CAD (coronary artery disease)     MI - stent palcement; stress myoview 4/10, repeat stent 4/10   . Skin cancer, basal cell     Patient Active Problem List   Diagnosis Date Noted  . Human immunodeficiency virus (HIV) disease ([place]) 11/25/2009  . Hyperlipidemia 11/23/2009  . Coronary atherosclerosis 11/23/2009  . Chest pain 11/23/2009    Past Surgical History  Procedure Laterality Date  . Cholecystectomy  3/03  . Amputation  1985    L fingers reattachement   . Wrist surgery  10/2011    Current Outpatient Rx  Name  Route  Sig  Dispense  Refill  . aspirin 325 MG EC tablet   Oral   Take 325 mg by mouth daily.           Marland Kitchen atorvastatin (LIPITOR) 20 MG tablet      TAKE 1 TABLET (20 MG TOTAL) BY MOUTH DAILY.   90 tablet   3   . efavirenz-emtrictabine-tenofovir (ATRIPLA) 600-200-300 MG per tablet   Oral   Take 1 tablet by mouth daily.           Marland Kitchen ezetimibe (ZETIA) 10 MG tablet   Oral   Take 1 tablet (10 mg total) by mouth daily.   90 tablet   4     Allergies Review of  patient's allergies indicates no known allergies.  Family History  Problem Relation Age of Onset  . Coronary artery disease      family hx  . Heart disease Father     Social History Social History  Substance Use Topics  . Smoking status: Former Research scientist (life sciences)  . Smokeless tobacco: None  . Alcohol Use: No     Comment: occasional     Review of Systems       Musculoskeletal: Negative for back pain. Right calf pain as above Skin: Negative for pallor Neurological: Negative for focal weakness   ____________________________________________   PHYSICAL EXAM:  VITAL SIGNS: ED Triage Vitals  Enc Vitals Group     BP 07/27/15 0349 101/74 mmHg     Pulse Rate 07/27/15 0349 63     Resp 07/27/15 0349 18     Temp 07/27/15 0349 97.7 F (36.5 C)     Temp Source 07/27/15 0349 Oral     SpO2 07/27/15 0349 98 %     Weight 07/27/15 0349 162 lb (73.483 kg)     Height 07/27/15 0349 5\' 11"  (1.803 m)     Head Cir --  Peak Flow --      Pain Score 07/27/15 0350 7     Pain Loc --      Pain Edu? --      Excl. in [place]? --    Constitutional: Alert and oriented. Well appearing and in no distress. Eyes: Conjunctivae are normal.  ENT   Head: Normocephalic and atraumatic.   Mouth/Throat: Mucous membranes are moist. Cardiovascular: Normal rate, regular rhythm. 2+ distal pulses in the lower extremities, feet are warm and well perfused. Respiratory: Normal respiratory effort without tachypnea nor retractions.   Musculoskeletal: Right lower extremity: No significant calf swelling, mild tenderness to palpation along the lateral posterior calf centrally ,2+ DP pulse. No pallor. No evidence of compartment syndrome. Neurologic:  Normal speech and language. No gross focal neurologic deficits are appreciated. Skin:  Skin is warm, dry and intact. No rash noted. Psychiatric: Mood and affect are normal. Patient exhibits appropriate insight and judgment.  ____________________________________________     LABS (pertinent positives/negatives)  Labs Reviewed - No data to display  ____________________________________________     ____________________________________________    RADIOLOGY I have personally reviewed any xrays that were ordered on this patient: None  ____________________________________________   PROCEDURES  Procedure(s) performed: none   ____________________________________________   INITIAL IMPRESSION / ASSESSMENT AND PLAN / ED COURSE  Pertinent labs & imaging results that were available during my care of the patient were reviewed by me and considered in my medical decision making (see chart for details).  History of present illness an exam most consistent with calf strain, recommend supportive care including rice. Return to work on April 24 with restrictions as listed  ____________________________________________   FINAL CLINICAL IMPRESSION(S) / ED DIAGNOSES  Final diagnoses:  Strain of calf muscle, right, initial encounter     Lavonia Drafts, MD 07/27/15 [phone]

## 2015-07-27 NOTE — ED Notes (Signed)
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 Small bruise noted lateral posterior calf. No other obvious injuries noted.

## 2015-07-27 NOTE — Discharge Instructions (Signed)

## 2015-07-27 NOTE — ED Notes (Signed)
Pt states as he was working he felt "pop" to right calf states now has pain on ambulation.

## 2015-10-11 ENCOUNTER — Telehealth: Payer: Self-pay | Admitting: Pharmacist

## 2015-10-11 NOTE — Telephone Encounter (Signed)
Called patient to schedule an appointment for the [facility]. I was unable to reach him but I left a HIPAA-compliant message requesting that he return my call.

## 2015-10-16 ENCOUNTER — Ambulatory Visit (HOSPITAL_BASED_OUTPATIENT_CLINIC_OR_DEPARTMENT_OTHER): Payer: 59 | Admitting: Pharmacist

## 2015-10-16 DIAGNOSIS — B20 Human immunodeficiency virus [HIV] disease: Secondary | ICD-10-CM

## 2015-10-16 MED ORDER — EFAVIRENZ-EMTRICITAB-TENOFOVIR 600-200-300 MG PO TABS: 1.0000 | ORAL_TABLET | Freq: Every day | ORAL | Status: DC

## 2015-10-16 NOTE — Progress Notes (Signed)
   S: Patient presents today to the [facility].  Patient is currently taking Atripla for HIV. Patient is managed by Dr. Ola Spurr at [facility] for this. He is currently well controlled with an undetectable viral load and great CD4 count.   Adherence: denies any missed doses  Dosing:  HIV-1 infection: Oral: One tablet once daily.  Drug-drug interactions: none  Monitoring: HIV RNA: undetectable CD4 count: 552 (04/2015) S/sx of opportunistic infections: denies Neuropsychiatric s/sx:denies Skin reactions: denies Hepatotoxicity: denies Nephrotoxicity: denies S/sx of lactic acidosis: denies S/sx of fractures: denies Fat redistribution: denies  O:     Lab Results  Component Value Date   WBC 4.4 04/20/2015   HGB 13.5 07/21/2014   HCT 43.8 04/20/2015   MCV 95 04/20/2015   PLT 247 04/20/2015      Chemistry      Component Value Date/Time   NA 138 04/20/2015 1058   NA 139 07/21/2014 1521   K 4.6 04/20/2015 1058   K 3.9 07/21/2014 1521   CL 106 04/20/2015 1058   CL 105 07/21/2014 1521   CO2 30 04/20/2015 1058   CO2 28 07/21/2014 1521   BUN 26* 04/20/2015 1058   BUN 29* 07/21/2014 1521   CREATININE 1.16 04/20/2015 1058   CREATININE 1.02 07/21/2014 1521      Component Value Date/Time   CALCIUM 9.1 04/20/2015 1058   CALCIUM 8.8* 07/21/2014 1521   ALKPHOS 96 04/20/2015 1058   ALKPHOS 90 07/21/2014 1521   AST 25 04/20/2015 1058   AST 24 07/21/2014 1521   ALT 21 04/20/2015 1058   ALT 19 07/21/2014 1521   BILITOT 0.6 04/20/2015 1058   BILITOT 0.2* 07/21/2014 1521       A/P: 1. Medication review: patient on Atripla for HIV and tolerating well with no adverse effects and an undetectable HIV viral load and good CD4 count. Pertinent labs all WNL, including SCr, LFTs, CBC. Reviewed the medication with him, including the need for regular lab monitoring and adherence to his medication. No recommendations for changes.    Nicoletta Ba, PharmD, BCPS, [place] [facility] [phone]  Evaluation and management procedures were performed by the Clinical Pharmacy Practitioner under my supervision and collaboration. I have reviewed the Practitioner's note and chart, and I agree with the management and plan as documented above.   Angelica Chessman, MD, [place], [place], [place], [place] and [place], [place]   10/18/2015, 1:28 PM

## 2015-10-31 MED FILL — ATRIPLA 600-200-300 MG TABS: 600-200-300 | 30 days supply | Qty: 30 | Fill #0

## 2015-12-07 MED FILL — ATRIPLA 600-200-300 MG TABS: 600-200-300 | 30 days supply | Qty: 30 | Fill #1

## 2016-01-04 MED FILL — ATRIPLA 600-200-300 MG TABS: 600-200-300 | 30 days supply | Qty: 30 | Fill #2

## 2016-01-28 ENCOUNTER — Other Ambulatory Visit: Payer: Self-pay | Admitting: Pharmacist

## 2016-01-28 MED ORDER — EFAVIRENZ-EMTRICITAB-TENOFOVIR 600-200-300 MG PO TABS: 1.0000 | ORAL_TABLET | Freq: Every day | ORAL | 11 refills | Status: DC

## 2016-01-28 MED FILL — ATRIPLA 600-200-300 MG TABS: 600-200-300 | 30 days supply | Qty: 30 | Fill #0

## 2016-02-20 MED FILL — ATRIPLA 600-200-300 MG TABS: 600-200-300 | 30 days supply | Qty: 30 | Fill #1

## 2016-03-18 MED FILL — ATRIPLA 600-200-300 MG TABS: 600-200-300 | 30 days supply | Qty: 30 | Fill #2

## 2016-04-18 ENCOUNTER — Ambulatory Visit (INDEPENDENT_AMBULATORY_CARE_PROVIDER_SITE_OTHER): Payer: 59 | Admitting: Cardiovascular Disease

## 2016-04-18 ENCOUNTER — Encounter: Payer: Self-pay | Admitting: Cardiovascular Disease

## 2016-04-18 VITALS — BP 108/62 | HR 56 | Ht 71.0 in | Wt 173.0 lb

## 2016-04-18 DIAGNOSIS — I2510 Atherosclerotic heart disease of native coronary artery without angina pectoris: Secondary | ICD-10-CM

## 2016-04-18 DIAGNOSIS — E782 Mixed hyperlipidemia: Secondary | ICD-10-CM

## 2016-04-18 DIAGNOSIS — R0789 Other chest pain: Secondary | ICD-10-CM

## 2016-04-18 DIAGNOSIS — B20 Human immunodeficiency virus [HIV] disease: Secondary | ICD-10-CM

## 2016-04-18 NOTE — Patient Instructions (Addendum)
Medication Instructions:   If cholesterol is elevated, We will change to crestor  Labwork:  We will check liver and lipids today  Testing/Procedures:  No further testing at this time   I recommend watching educational videos on topics of interest to you at:       [email or web address]  Enter code: HEARTCARE    Follow-Up: It was a pleasure seeing you in the office today. Please call us if you have new issues that need to be addressed before your next appt.  [phone]  Your physician wants you to follow-up in: 12 months.  You will receive a reminder letter in the mail two months in advance. If you don't receive a letter, please call our office to schedule the follow-up appointment.  If you need a refill on your cardiac medications before your next appointment, please call your pharmacy.

## 2016-04-18 NOTE — Progress Notes (Signed)
Cardiology Office Note  Date:  04/18/2016   ID:  Daniel Black, DOB 01/13/1961, MRN [id number]  PCP:  Daniel Ramsay, MD   Chief Complaint  Patient presents with  . other    12 month follow up. "doing well." Meds reviewed by the pt. verbally.     HPI:  Daniel Black is a very pleasant 56 year old gentleman with remote smoking history, coronary artery disease, MI in 2004 with PCI to the LAD and mid RCA at that time, PCI in 2010 of the mid RCA lesion, hyperlipidemia, HIV who is on antivirals with a strong family history of coronary artery disease who presents for routine follow up of his coronary artery disease.  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 He is tolerating Lipitor 20 mg daily Previously on Crestor, like the medication better  EKG on today's visit shows normal sinus rhythm with rate 56 bpm, no significant ST or T-wave changes  Other past medical history  Cardiac catheterization report from April 2010 details patent LAD stent, 30% distal LAD, 25% proximal left circumflex, 20% mid circumflex, 95% mid RCA, 40% distal RCA, 30% right PDA. Xience 3.5 x 28 mm DES stent placed to the mid RCA.     PMH:   has a past medical history of Asymptomatic HIV infection ([place]); Bell's palsy (2004); CAD (coronary artery disease); DDD (degenerative disc disease) (10/02); HLD (hyperlipidemia); Nephrolithiasis; and Skin cancer, basal cell.  PSH:    Past Surgical History:  Procedure Laterality Date  . AMPUTATION  1985   L fingers reattachement   . CHOLECYSTECTOMY  3/03  . WRIST SURGERY  10/2011    Current Outpatient Prescriptions  Medication Sig Dispense Refill  . aspirin 325 MG EC tablet Take 325 mg by mouth daily.      Marland Kitchen atorvastatin (LIPITOR) 20 MG tablet TAKE 1 TABLET (20 MG TOTAL) BY MOUTH DAILY. 90 tablet 3  . efavirenz-emtricitabine-tenofovir (ATRIPLA) 600-200-300 MG tablet Take 1 tablet by mouth at bedtime. 30 tablet 11  . ezetimibe (ZETIA) 10 MG tablet Take 1 tablet (10 mg total) by mouth daily. 90 tablet 4   No current facility-administered medications for this visit.      Allergies:   Patient has no known allergies.   Social History:  The patient  reports that he has quit smoking. He has never used smokeless tobacco. He reports that he does not drink alcohol or use drugs.   Family History:   family history includes Heart disease in his father.    Review of Systems: Review of Systems  Constitutional: Negative.        Weight gain  Respiratory: Negative.   Cardiovascular: Negative.   Gastrointestinal: Negative.   Musculoskeletal: Negative.   Neurological: Negative.   Psychiatric/Behavioral: Negative.   All other systems reviewed and are negative.    PHYSICAL EXAM: VS:  BP 108/62 (BP Location: Left Arm, Patient Position: Sitting, Cuff Size: Normal)   Pulse (!) 56   Ht 5\' 11"  (1.803 m)  Wt 173 lb (78.5 kg)   BMI 24.13 kg/m  , BMI Body mass index is 24.13 kg/m. GEN: Well nourished, well developed, in no acute distress  HEENT: normal  Neck: no JVD, carotid bruits, or masses Cardiac: RRR; no murmurs, rubs, or gallops,no edema  Respiratory:  clear to auscultation bilaterally, normal work of breathing GI: soft, nontender, nondistended, + BS MS: no deformity or atrophy  Skin: warm and dry, no rash Neuro:  Strength and sensation are intact Psych: euthymic mood, full affect    Recent Labs: 04/20/2015: ALT 21; BUN 26; Creatinine, Ser 1.16; Platelets 247; Potassium 4.6; Sodium  138    Lipid Panel Lab Results  Component Value Date   CHOL 160 04/20/2015   HDL 63 04/20/2015   LDLCALC 82 04/20/2015   TRIG 73 04/20/2015      Wt Readings from Last 3 Encounters:  04/18/16 173 lb (78.5 kg)  07/27/15 162 lb (73.5 kg)  06/04/15 163 lb 12.8 oz (74.3 kg)       ASSESSMENT AND PLAN:  Atherosclerosis of native coronary artery without angina pectoris, unspecified whether native or transplanted heart - Plan: EKG 12-Lead, Hepatic function panel, Lipid Profile No symptoms, active  Mixed hyperlipidemia Lipids drawn today, will likely change to Crestor Has not been compliant with zetia  Human immunodeficiency virus (HIV) disease (HCC) Stable, on antivirals  Other chest pain No recent chest pain   Total encounter time more than 15 minutes  Greater than 50% was spent in counseling and coordination of care with the patient   Disposition:   F/U  6 months   Orders Placed This Encounter  Procedures  . Hepatic function panel  . Lipid Profile  . EKG 12-Lead     Signed, Esmond Plants, M.D., Ph.D. 04/18/2016  [place], [place]

## 2016-04-19 LAB — LIPID PANEL
CHOLESTEROL TOTAL: 148 mg/dL (ref 100–199)
Chol/HDL Ratio: 3 ratio units (ref 0.0–5.0)
HDL: 49 mg/dL (ref >39)
LDL CALC: 82 mg/dL (ref 0–99)
TRIGLYCERIDES: 85 mg/dL (ref 0–149)
VLDL Cholesterol Cal: 17 mg/dL (ref 5–40)

## 2016-04-19 LAB — HEPATIC FUNCTION PANEL
ALBUMIN: 4.7 g/dL (ref 3.5–5.5)
ALT: 14 IU/L (ref 0–44)
AST: 20 IU/L (ref 0–40)
Alkaline Phosphatase: 96 IU/L (ref 39–117)
BILIRUBIN, DIRECT: 0.08 mg/dL (ref 0.00–0.40)
Bilirubin Total: 0.3 mg/dL (ref 0.0–1.2)
Total Protein: 6.9 g/dL (ref 6.0–8.5)

## 2016-04-21 ENCOUNTER — Other Ambulatory Visit: Payer: Self-pay

## 2016-04-21 MED ORDER — ROSUVASTATIN CALCIUM 40 MG PO TABS: 40.0000 mg | ORAL_TABLET | Freq: Every day | ORAL | 3 refills | Status: DC

## 2016-04-22 ENCOUNTER — Telehealth: Payer: Self-pay | Admitting: Gastroenterology

## 2016-04-22 DIAGNOSIS — Z0000 Encounter for general adult medical examination without abnormal findings: Secondary | ICD-10-CM | POA: Diagnosis not present

## 2016-04-22 DIAGNOSIS — I2510 Atherosclerotic heart disease of native coronary artery without angina pectoris: Secondary | ICD-10-CM | POA: Diagnosis not present

## 2016-04-22 DIAGNOSIS — B20 Human immunodeficiency virus [HIV] disease: Secondary | ICD-10-CM | POA: Diagnosis not present

## 2016-04-22 DIAGNOSIS — E782 Mixed hyperlipidemia: Secondary | ICD-10-CM | POA: Diagnosis not present

## 2016-04-22 NOTE — Telephone Encounter (Signed)
Colonoscopy - please get insurance info and I will put it in.

## 2016-04-28 MED FILL — ATRIPLA 600-200-300 MG TABS: 600-200-300 | 30 days supply | Qty: 30 | Fill #3

## 2016-05-05 ENCOUNTER — Telehealth: Payer: Self-pay | Admitting: Gastroenterology

## 2016-05-05 NOTE — Telephone Encounter (Signed)
colonoscopy

## 2016-05-09 ENCOUNTER — Other Ambulatory Visit: Payer: Self-pay

## 2016-05-09 NOTE — Telephone Encounter (Signed)
Screening colonoscopy at [facility] on 06/13/16 with Wohl.

## 2016-05-09 NOTE — Telephone Encounter (Signed)
Gastroenterology Pre-Procedure Review  Request Date:  Requesting Physician: Dr.   PATIENT REVIEW QUESTIONS: The patient responded to the following health history questions as indicated:    1. Are you having any GI issues? no 2. Do you have a personal history of Polyps? no 3. Do you have a family history of Colon Cancer or Polyps? no 4. Diabetes Mellitus? no 5. Joint replacements in the past 12 months?no 6. Major health problems in the past 3 months?no 7. Any artificial heart valves, MVP, or defibrillator?yes (Stents placed over 10 years)    MEDICATIONS & ALLERGIES:    Patient reports the following regarding taking any anticoagulation/antiplatelet therapy:   Plavix, Coumadin, Eliquis, Xarelto, Lovenox, Pradaxa, Brilinta, or Effient? no Aspirin? yes (ASA 325mg )  Patient confirms/reports the following medications:  Current Outpatient Prescriptions  Medication Sig Dispense Refill  . aspirin 325 MG EC tablet Take 325 mg by mouth daily.      Marland Kitchen efavirenz-emtricitabine-tenofovir (ATRIPLA) 600-200-300 MG tablet Take 1 tablet by mouth at bedtime. 30 tablet 11  . ezetimibe (ZETIA) 10 MG tablet Take 1 tablet (10 mg total) by mouth daily. 90 tablet 4  . rosuvastatin (CRESTOR) 40 MG tablet Take 1 tablet (40 mg total) by mouth daily. 90 tablet 3   No current facility-administered medications for this visit.     Patient confirms/reports the following allergies:  No Known Allergies  No orders of the defined types were placed in this encounter.   AUTHORIZATION INFORMATION Primary Insurance: 1D#: Group #:  Secondary Insurance: 1D#: Group #:  SCHEDULE INFORMATION: Date: 06/13/16 Time: Location: [place]

## 2016-05-09 NOTE — Telephone Encounter (Signed)
LVM for pt to return my call.

## 2016-05-26 MED FILL — ATRIPLA 600-200-300 MG TABS: 600-200-300 | 30 days supply | Qty: 30 | Fill #4

## 2016-06-04 DIAGNOSIS — H524 Presbyopia: Secondary | ICD-10-CM | POA: Diagnosis not present

## 2016-06-10 ENCOUNTER — Encounter: Payer: Self-pay | Admitting: *Deleted

## 2016-06-12 NOTE — Discharge Instructions (Signed)

## 2016-06-13 ENCOUNTER — Ambulatory Visit: Payer: 59 | Admitting: Anesthesiology

## 2016-06-13 ENCOUNTER — Encounter
Admission: RE | Disposition: A | Discharge status: Home / Self Care | Payer: Self-pay | Source: Ambulatory Visit | Attending: Gastroenterology

## 2016-06-13 ENCOUNTER — Ambulatory Visit
Admission: RE | Admit: 2016-06-13 | Discharge: 2016-06-13 | Disposition: A | Discharge status: Home / Self Care | Payer: 59 | Source: Ambulatory Visit | Attending: Gastroenterology | Admitting: Gastroenterology

## 2016-06-13 DIAGNOSIS — Z7982 Long term (current) use of aspirin: Secondary | ICD-10-CM | POA: Insufficient documentation

## 2016-06-13 DIAGNOSIS — K5730 Diverticulosis of large intestine without perforation or abscess without bleeding: Secondary | ICD-10-CM | POA: Insufficient documentation

## 2016-06-13 DIAGNOSIS — Z955 Presence of coronary angioplasty implant and graft: Secondary | ICD-10-CM | POA: Insufficient documentation

## 2016-06-13 DIAGNOSIS — K635 Polyp of colon: Secondary | ICD-10-CM | POA: Diagnosis not present

## 2016-06-13 DIAGNOSIS — Z87891 Personal history of nicotine dependence: Secondary | ICD-10-CM | POA: Insufficient documentation

## 2016-06-13 DIAGNOSIS — I2510 Atherosclerotic heart disease of native coronary artery without angina pectoris: Secondary | ICD-10-CM | POA: Insufficient documentation

## 2016-06-13 DIAGNOSIS — K640 First degree hemorrhoids: Secondary | ICD-10-CM | POA: Diagnosis not present

## 2016-06-13 DIAGNOSIS — Z1211 Encounter for screening for malignant neoplasm of colon: Secondary | ICD-10-CM | POA: Diagnosis not present

## 2016-06-13 DIAGNOSIS — D122 Benign neoplasm of ascending colon: Secondary | ICD-10-CM | POA: Diagnosis not present

## 2016-06-13 DIAGNOSIS — B20 Human immunodeficiency virus [HIV] disease: Secondary | ICD-10-CM | POA: Insufficient documentation

## 2016-06-13 DIAGNOSIS — E782 Mixed hyperlipidemia: Secondary | ICD-10-CM | POA: Diagnosis not present

## 2016-06-13 DIAGNOSIS — Z79899 Other long term (current) drug therapy: Secondary | ICD-10-CM | POA: Diagnosis not present

## 2016-06-13 DIAGNOSIS — I252 Old myocardial infarction: Secondary | ICD-10-CM | POA: Diagnosis not present

## 2016-06-13 HISTORY — PX: COLONOSCOPY WITH PROPOFOL: SHX5780

## 2016-06-13 HISTORY — PX: POLYPECTOMY: SHX5525

## 2016-06-13 HISTORY — DX: Acute myocardial infarction, unspecified: I21.9

## 2016-06-13 SURGERY — COLONOSCOPY WITH PROPOFOL
Anesthesia: Monitor Anesthesia Care | Wound class: Contaminated

## 2016-06-13 MED ORDER — LACTATED RINGERS IV SOLN
INTRAVENOUS | Status: DC
  Administered 2016-06-13: 08:00:00 via INTRAVENOUS

## 2016-06-13 MED ORDER — HYDROCORTISONE ACETATE 25 MG RE SUPP: 25.0000 mg | Freq: Two times a day (BID) | RECTAL | 0 refills | Status: DC

## 2016-06-13 MED ORDER — LIDOCAINE HCL (CARDIAC) 20 MG/ML IV SOLN
INTRAVENOUS | Status: DC | PRN
  Administered 2016-06-13: 50 mg via INTRAVENOUS

## 2016-06-13 MED ORDER — PROPOFOL 10 MG/ML IV BOLUS
INTRAVENOUS | Status: DC | PRN
  Administered 2016-06-13 (×2): 50 mg via INTRAVENOUS
  Administered 2016-06-13: 100 mg via INTRAVENOUS
  Administered 2016-06-13: 50 mg via INTRAVENOUS

## 2016-06-13 MED ORDER — SIMETHICONE 40 MG/0.6ML PO SUSP
ORAL | Status: DC | PRN
  Administered 2016-06-13: 08:00:00

## 2016-06-13 SURGICAL SUPPLY — 23 items

## 2016-06-13 NOTE — Anesthesia Preprocedure Evaluation (Addendum)
Anesthesia Evaluation  Patient identified by MRN, date of birth, ID band  Reviewed: NPO status   History of Anesthesia Complications Negative for: history of anesthetic complications  Airway Mallampati: II  TM Distance: >3 FB Neck ROM: full    Dental no notable dental hx.    Pulmonary neg pulmonary ROS, former smoker,    Pulmonary exam normal        Cardiovascular + CAD, + Past MI (2004) and + Cardiac Stents (x3 (2004-2005); on aspirin)  Normal cardiovascular exam  April 2010 Cath: patent LAD stent, 30% distal LAD, 25% proximal left circumflex, 20% mid circumflex, 95% mid RCA, 40% distal RCA, 30% right PDA. Xience 3.5 x 28 mm DES stent placed to the mid RCA.;  Cards stable: 04/2016: dr. Penni Homans;   Neuro/Psych Bell's palsy L  : 1990's negative neurological ROS  negative psych ROS   GI/Hepatic negative GI ROS, Neg liver ROS,   Endo/Other  negative endocrine ROS  Renal/GU negative Renal ROS  negative genitourinary   Musculoskeletal  (+) Arthritis , ddd   Abdominal   Peds  Hematology  (+) HIV,   Anesthesia Other Findings   Reproductive/Obstetrics                            Anesthesia Physical Anesthesia Plan  ASA: II  Anesthesia Plan: MAC   Post-op Pain Management:    Induction:   Airway Management Planned:   Additional Equipment:   Intra-op Plan:   Post-operative Plan:   Informed Consent: I have reviewed the patients History and Physical, chart, labs and discussed the procedure including the risks, benefits and alternatives for the proposed anesthesia with the patient or authorized representative who has indicated his/her understanding and acceptance.     Plan Discussed with: CRNA  Anesthesia Plan Comments:         Anesthesia Quick Evaluation

## 2016-06-13 NOTE — Anesthesia Procedure Notes (Signed)
Procedure Name: MAC Performed by: Sariya Trickey Pre-anesthesia Checklist: Patient identified, Emergency Drugs available, Suction available, Timeout performed and Patient being monitored Patient Re-evaluated:Patient Re-evaluated prior to inductionOxygen Delivery Method: Nasal cannula Placement Confirmation: positive ETCO2     

## 2016-06-13 NOTE — Transfer of Care (Signed)
Immediate Anesthesia Transfer of Care Note  Patient: Daniel Black  Procedure(s) Performed: Procedure(s): COLONOSCOPY WITH PROPOFOL (N/A) POLYPECTOMY  Patient Location: PACU  Anesthesia Type: MAC  Level of Consciousness: awake, alert  and patient cooperative  Airway and Oxygen Therapy: Patient Spontanous Breathing and Patient connected to supplemental oxygen  Post-op Assessment: Post-op Vital signs reviewed, Patient's Cardiovascular Status Stable, Respiratory Function Stable, Patent Airway and No signs of Nausea or vomiting  Post-op Vital Signs: Reviewed and stable  Complications: No apparent anesthesia complications

## 2016-06-13 NOTE — H&P (Signed)
Lucilla Lame, MD [facility] [address], [place], [place] [address] Phone: [phone] Fax : [phone]  Primary Care Physician:  Leonel Ramsay, MD Primary Gastroenterologist:  Dr. Allen Norris  Pre-Procedure History & Physical: HPI:  Daniel Black is a 56 y.o. male is here for a screening colonoscopy.   Past Medical History:  Diagnosis Date  . Asymptomatic HIV infection ([place])   . Bell's palsy 2004  . CAD (coronary artery disease)    MI - stent palcement; stress myoview 4/10, repeat stent 4/10   . DDD (degenerative disc disease) 10/02   has lumbar fusion  . HLD (hyperlipidemia)    mixed  . Myocardial infarction    x2, approx 2004  . Nephrolithiasis   . Skin cancer, basal cell     Past Surgical History:  Procedure Laterality Date  . AMPUTATION  1985   L fingers reattachement   . CARDIAC CATHETERIZATION     x2. 2004, 2010. 3 stents(total)  . CHOLECYSTECTOMY  3/03  . WRIST SURGERY  10/2011    Prior to Admission medications   Medication Sig Start Date End Date Taking? Authorizing Provider  aspirin 325 MG EC tablet Take 325 mg by mouth daily.     Yes Historical Provider, MD  efavirenz-emtricitabine-tenofovir (ATRIPLA) 600-200-300 MG tablet Take 1 tablet by mouth at bedtime. 01/28/16  Yes Tresa Garter, MD  Multiple Vitamin (MULTIVITAMIN) tablet Take 1 tablet by mouth daily.   Yes Historical Provider, MD  rosuvastatin (CRESTOR) 40 MG tablet Take 1 tablet (40 mg total) by mouth daily. 04/21/16 07/20/16 Yes [name], MD    Allergies as of 05/09/2016  . (No Known Allergies)    Family History  Problem Relation Age of Onset  . Heart disease Father   . Coronary artery disease      family hx    Social History   Social History  . Marital status: Single    Spouse name: N/A  . Number of children: N/A  . Years of education: N/A   Occupational History  . 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 Smoking status: Former Smoker      Quit date: 04/07/2010  . Smokeless tobacco: Never Used  . Alcohol use No     Comment: occasional (1x/mo)  . Drug use: No  . Sexual activity: Not on file   Other Topics Concern  . Not on file   Social History Narrative   Single - domestic partner.    Full time - orderly/ CNA   Regular exercise - ride, walk, run              Review of Systems: See HPI, otherwise negative ROS  Physical Exam: BP 112/71   Pulse (!) 56   Temp 97.7 F (36.5 C) (Temporal)   Ht 5\' 11"  (1.803 m)   Wt 170 lb (77.1 kg)   SpO2 100%   BMI 23.71 kg/m  General:   Alert,  pleasant and cooperative in NAD Head:  Normocephalic and atraumatic. Neck:  Supple; no masses or thyromegaly. Lungs:  Clear throughout to auscultation.    Heart:  Regular rate and rhythm. Abdomen:  Soft, nontender and nondistended. Normal bowel sounds, without guarding, and without rebound.   Neurologic:  Alert and  oriented x4;  grossly normal neurologically.  Impression/Plan: Daniel Black is now here to undergo a screening colonoscopy.  Risks, benefits, and alternatives regarding colonoscopy have been reviewed with the  patient.  Questions have been answered.  All parties agreeable.

## 2016-06-13 NOTE — Anesthesia Postprocedure Evaluation (Signed)
Anesthesia Post Note  Patient: Daniel Black  Procedure(s) Performed: Procedure(s) (LRB): COLONOSCOPY WITH PROPOFOL (N/A) POLYPECTOMY  Patient location during evaluation: PACU Anesthesia Type: MAC Level of consciousness: awake and alert Pain management: pain level controlled Vital Signs Assessment: post-procedure vital signs reviewed and stable Respiratory status: spontaneous breathing, nonlabored ventilation, respiratory function stable and patient connected to nasal cannula oxygen Cardiovascular status: stable and blood pressure returned to baseline Anesthetic complications: no    Jabori Henegar

## 2016-06-13 NOTE — Op Note (Signed)
[facility] Gastroenterology Patient Name: Daniel Black Procedure Date: 06/13/2016 7:54 AM MRN: [id number] Account #: [id number] Date of Birth: Jan 04, 1961 Admit Type: Outpatient Age: 56 Room: [facility] OR ROOM 01 Gender: Male Note Status: Finalized Procedure:            Colonoscopy Indications:          Screening for colorectal malignant neoplasm Providers:            Lucilla Lame MD, MD Referring MD:         Adrian Prows (Referring MD) Medicines:            Propofol per Anesthesia Complications:        No immediate complications. Procedure:            Pre-Anesthesia Assessment:                       - Prior to the procedure, a History and Physical was                        performed, and patient medications and allergies were                        reviewed. The patient's tolerance of previous                        anesthesia was also reviewed. The risks and benefits of                        the procedure and the sedation options and risks were                        discussed with the patient. All questions were                        answered, and informed consent was obtained. Prior                        Anticoagulants: The patient has taken no previous                        anticoagulant or antiplatelet agents. ASA Grade                        Assessment: II - A patient with mild systemic disease.                        After reviewing the risks and benefits, the patient was                        deemed in satisfactory condition to undergo the                        procedure.                       After obtaining informed consent, the colonoscope was                        passed under direct vision. Throughout the procedure,  the patient's blood pressure, pulse, and oxygen                        saturations were monitored continuously. The Olympus CF                        H180AL Colonoscope (S#: [id number]) was introduced through                         the anus and advanced to the the cecum, identified by                        appendiceal orifice and ileocecal valve. The                        colonoscopy was performed without difficulty. The                        patient tolerated the procedure well. The quality of                        the bowel preparation was excellent. Findings:      The perianal and digital rectal examinations were normal.      A 2 mm polyp was found in the ascending colon. The polyp was sessile.       The polyp was removed with a cold biopsy forceps. Resection and       retrieval were complete.      A single small-mouthed diverticulum was found in the sigmoid colon.      Non-bleeding internal hemorrhoids were found during retroflexion. The       hemorrhoids were Grade I (internal hemorrhoids that do not prolapse). Impression:           - One 2 mm polyp in the ascending colon, removed with a                        cold biopsy forceps. Resected and retrieved.                       - Diverticulosis in the sigmoid colon.                       - Non-bleeding internal hemorrhoids. Recommendation:       - Discharge patient to home.                       - Resume previous diet.                       - Continue present medications.                       - Await pathology results.                       - Repeat colonoscopy in 5 years if polyp adenoma and 10                        years if hyperplastic Procedure Code(s):    --- Professional ---  45380, Colonoscopy, flexible; with biopsy, single or                        multiple Diagnosis Code(s):    --- Professional ---                       Z12.11, Encounter for screening for malignant neoplasm                        of colon                       D12.2, Benign neoplasm of ascending colon CPT copyright 2016 American Medical Association. All rights reserved. The codes documented in this report are preliminary and upon  coder review may  be revised to meet current compliance requirements. Lucilla Lame MD, MD 06/13/2016 8:19:39 AM This report has been signed electronically. Number of Addenda: 0 Note Initiated On: 06/13/2016 7:54 AM Scope Withdrawal Time: 0 hours 9 minutes 58 seconds  Total Procedure Duration: 0 hours 15 minutes 57 seconds       [facility]

## 2016-06-16 ENCOUNTER — Encounter: Payer: Self-pay | Admitting: Gastroenterology

## 2016-06-25 MED FILL — ATRIPLA 600-200-300 MG TABS: 600-200-300 | 30 days supply | Qty: 30 | Fill #5

## 2016-07-29 MED FILL — ATRIPLA 600-200-300 MG TABS: 600-200-300 | 30 days supply | Qty: 30 | Fill #6

## 2016-09-02 MED FILL — ATRIPLA 600-200-300 MG TABS: 600-200-300 | 30 days supply | Qty: 30 | Fill #7

## 2016-09-29 MED FILL — ATRIPLA 600-200-300 MG TABS: 600-200-300 | 30 days supply | Qty: 30 | Fill #8

## 2016-10-20 DIAGNOSIS — B20 Human immunodeficiency virus [HIV] disease: Secondary | ICD-10-CM | POA: Diagnosis not present

## 2016-10-27 MED FILL — ATRIPLA 600-200-300 MG TABS: 600-200-300 | 30 days supply | Qty: 30 | Fill #9

## 2016-12-01 MED FILL — ATRIPLA 600-200-300 MG TABS: 600-200-300 | 30 days supply | Qty: 30 | Fill #10

## 2017-01-05 MED FILL — ATRIPLA 600-200-300 MG TABS: 600-200-300 | 30 days supply | Qty: 30 | Fill #11

## 2017-02-09 ENCOUNTER — Other Ambulatory Visit: Payer: Self-pay | Admitting: Internal Medicine

## 2017-02-10 ENCOUNTER — Encounter: Payer: Self-pay | Admitting: Pharmacist

## 2017-02-10 ENCOUNTER — Other Ambulatory Visit: Payer: Self-pay | Admitting: Pharmacist

## 2017-02-10 ENCOUNTER — Other Ambulatory Visit: Payer: Self-pay | Admitting: Internal Medicine

## 2017-02-10 ENCOUNTER — Ambulatory Visit: Payer: 59 | Attending: Internal Medicine | Admitting: Pharmacist

## 2017-02-10 DIAGNOSIS — B20 Human immunodeficiency virus [HIV] disease: Secondary | ICD-10-CM

## 2017-02-10 MED ORDER — EFAVIRENZ-EMTRICITAB-TENOFOVIR 600-200-300 MG PO TABS: 1.0000 | ORAL_TABLET | Freq: Every day | ORAL | 11 refills | Status: DC

## 2017-02-10 MED FILL — ATRIPLA 600-200-300 MG TABS: 600-200-300 | 30 days supply | Qty: 30 | Fill #0

## 2017-02-10 NOTE — Progress Notes (Signed)
   S: Patient presents today to the [facility] for review of his specialty medication..  Patient is currently taking Atripla for HIV. Patient is managed by Dr. Ola Spurr at [facility] for this. He is currently well controlled with an undetectable viral load and great CD4 count.   Adherence: denies any missed doses  Efficacy: HIV well controlled. His doctor did discuss maybe changing the Atripla to something else at the last appt but has stayed on Atripla for now.   Dosing:  HIV-1 infection: Oral: One tablet once daily.  Drug-drug interactions: none  Monitoring: HIV RNA: undetectable (10/31/2016) CD4 count: 552 (04/2015) S/sx of opportunistic infections: denies Neuropsychiatric s/sx:denies Skin reactions: denies Hepatotoxicity: denies Nephrotoxicity: denies S/sx of lactic acidosis: denies S/sx of fractures: denies Fat redistribution: denies  O:     Lab Results  Component Value Date   WBC 4.4 04/20/2015   HGB 15.1 04/20/2015   HCT 43.8 04/20/2015   MCV 95 04/20/2015   PLT 247 04/20/2015      Chemistry      Component Value Date/Time   NA 138 04/20/2015 1058   NA 139 07/21/2014 1521   K 4.6 04/20/2015 1058   K 3.9 07/21/2014 1521   CL 106 04/20/2015 1058   CL 105 07/21/2014 1521   CO2 30 04/20/2015 1058   CO2 28 07/21/2014 1521   BUN 26 (H) 04/20/2015 1058   BUN 29 (H) 07/21/2014 1521   CREATININE 1.16 04/20/2015 1058   CREATININE 1.02 07/21/2014 1521      Component Value Date/Time   CALCIUM 9.1 04/20/2015 1058   CALCIUM 8.8 (L) 07/21/2014 1521   ALKPHOS 96 04/18/2016 1024   ALKPHOS 90 07/21/2014 1521   AST 20 04/18/2016 1024   AST 24 07/21/2014 1521   ALT 14 04/18/2016 1024   ALT 19 07/21/2014 1521   BILITOT 0.3 04/18/2016 1024   BILITOT 0.2 (L) 07/21/2014 1521       A/P: 1. Medication review: patient on Atripla for HIV and tolerating well with no adverse effects and an undetectable HIV viral load and good  CD4 count. Pertinent labs all WNL, including SCr, LFTs, CBC. Reviewed the medication with him, including the need for regular lab monitoring and adherence to his medication. No recommendations for changes.   Christella Hartigan, PharmD, BCPS, BCACP, [place] [facility] [phone]

## 2017-02-19 ENCOUNTER — Encounter: Payer: Self-pay | Admitting: Cardiovascular Disease

## 2017-03-10 MED FILL — ATRIPLA 600-200-300 MG TABS: 600-200-300 | 30 days supply | Qty: 30 | Fill #1

## 2017-04-08 MED FILL — ATRIPLA 600-200-300 MG TABS: 600-200-300 | 30 days supply | Qty: 30 | Fill #2

## 2017-04-18 NOTE — Progress Notes (Signed)
Cardiology Office Note  Date:  04/20/2017   ID:  Daniel Black, DOB October 28, 1960, MRN [id number]  PCP:  Daniel Ramsay, MD   Chief Complaint  Patient presents with  . other    12 month follow up. "doing well." Meds reviewed by the pt. verbally.     HPI:  Daniel Black is a very pleasant 57 year old gentleman with  remote smoking history,  coronary artery disease,  MI in 2004 with PCI to the LAD and mid RCA at that time,  PCI in 2010 of the mid RCA lesion,  hyperlipidemia, HIV who is on antivirals   who presents for routine follow up of his coronary artery disease.  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, [place] to exercise but not as much as before Weight has been climbing over the past year Less exercise, eating more  Rare episodes of chest tightness on the treadmill echo way with deep inspiration  Does not take his Crestor consistently Has follow-up scheduled with primary care later this week for lab work  denies any significant symptoms of chest pain concerning for angina On previous office visit blood pressure was low from weight loss Ow again today, no orthostasis symptoms  LDL 80, above goal  EKG on today's visit shows normal sinus rhythm with rate 60 bpm, no significant ST or T-wave changes  Other past medical history  Cardiac catheterization report from April 2010 details patent LAD stent, 30% distal LAD, 25% proximal left circumflex, 20% mid circumflex, 95% mid RCA, 40% distal RCA, 30% right PDA. Xience 3.5 x 28 mm DES stent placed to the mid RCA.     PMH:   has a past medical history of Asymptomatic HIV infection ([place]), Bell's palsy (2004), CAD (coronary artery disease), DDD (degenerative disc disease) (10/02), HLD (hyperlipidemia), Myocardial infarction ([place]), Nephrolithiasis, and Skin cancer, basal cell.  PSH:    Past Surgical History:  Procedure Laterality Date  . AMPUTATION  1985   L fingers reattachement   . CARDIAC CATHETERIZATION     x2. 2004, 2010. 3 stents(total)  . CHOLECYSTECTOMY  3/03  . COLONOSCOPY WITH PROPOFOL N/A 06/13/2016   Procedure: COLONOSCOPY WITH PROPOFOL;  Surgeon: Lucilla Lame, MD;  Location: [facility];  Service: Endoscopy;  Laterality: N/A;  . POLYPECTOMY  06/13/2016   Procedure: POLYPECTOMY;  Surgeon: Lucilla Lame, MD;  Location: [facility];  Service: Endoscopy;;  . WRIST SURGERY  10/2011    Current Outpatient Medications  Medication Sig Dispense Refill  . aspirin 325 MG EC tablet Take 325 mg by mouth daily.      Marland Kitchen efavirenz-emtricitabine-tenofovir (ATRIPLA) 600-200-300 MG tablet Take 1 tablet at bedtime by mouth. 30 tablet 11  . Multiple Vitamin (MULTIVITAMIN) tablet Take 1 tablet by mouth daily.    . rosuvastatin (CRESTOR) 40 MG tablet Take 1 tablet (40 mg total) by mouth daily. 90 tablet 3   No current facility-administered medications for this visit.      Allergies:   Patient has no known allergies.   Social History:  The patient  reports that he quit smoking about 7 years ago. he has never used smokeless tobacco. He reports that he does not drink alcohol or use drugs.   Family History:   family history includes Coronary artery disease in his unknown relative; Heart disease in his father.    Review of Systems: Review of Systems  Constitutional: Negative.   Respiratory: Negative.   Cardiovascular: Negative.   Gastrointestinal: Negative.   Musculoskeletal: Negative.  Neurological: Negative.   Psychiatric/Behavioral: Negative.   All other systems reviewed and are negative.    PHYSICAL EXAM: VS:  BP 100/80 (BP Location: Left Arm, Patient Position: Sitting, Cuff Size: Normal)   Pulse 60   Ht 5\' 11"  (1.803 m)   Wt 186 lb (84.4 kg)   BMI 25.94 kg/m  , BMI Body mass index is 25.94 kg/m.  No changes to physical exam GEN: Well nourished, well developed, in no acute distress  HEENT: normal  Neck: no JVD, carotid bruits, or masses Cardiac: RRR; no murmurs, rubs, or  gallops,no edema  Respiratory:  clear to auscultation bilaterally, normal work of breathing GI: soft, nontender, nondistended, + BS MS: no deformity or atrophy  Skin: warm and dry, no rash Neuro:  Strength and sensation are intact Psych: euthymic mood, full affect    Recent Labs: No results found for requested labs within last 8760 hours.    Lipid Panel Lab Results  Component Value Date   CHOL 148 04/18/2016   HDL 49 04/18/2016   LDLCALC 82 04/18/2016   TRIG 85 04/18/2016      Wt Readings from Last 3 Encounters:  04/20/17 186 lb (84.4 kg)  06/13/16 170 lb (77.1 kg)  04/18/16 173 lb (78.5 kg)       ASSESSMENT AND PLAN:  Atherosclerosis of native coronary artery without angina pectoris, unspecified whether native or transplanted heart -  No symptoms, active Stressed importance of taking his cholesterol medication  Mixed hyperlipidemia Prefers to have labs done through primary care on annual visit date her this week Goal LDL less than 70  Human immunodeficiency virus (HIV) disease ([place]) Stable, on antivirals Discussed labs with him  Other chest pain No recent chest pain Occasional shortness of breath on treadmill resolving with deep inspiration, atypical in nature No further testing   Total encounter time more than 15 minutes  Greater than 50% was spent in counseling and coordination of care with the patient   Disposition:   F/U  12 months   Orders Placed This Encounter  Procedures  . EKG 12-Lead     Signed, Esmond Plants, M.D., Ph.D. 04/20/2017  [place], [place]

## 2017-04-20 ENCOUNTER — Encounter: Payer: Self-pay | Admitting: Cardiovascular Disease

## 2017-04-20 ENCOUNTER — Ambulatory Visit: Payer: 59 | Admitting: Cardiovascular Disease

## 2017-04-20 VITALS — BP 100/80 | HR 60 | Ht 71.0 in | Wt 186.0 lb

## 2017-04-20 DIAGNOSIS — B20 Human immunodeficiency virus [HIV] disease: Secondary | ICD-10-CM

## 2017-04-20 DIAGNOSIS — I25118 Atherosclerotic heart disease of native coronary artery with other forms of angina pectoris: Secondary | ICD-10-CM

## 2017-04-20 DIAGNOSIS — E782 Mixed hyperlipidemia: Secondary | ICD-10-CM | POA: Diagnosis not present

## 2017-04-20 NOTE — Patient Instructions (Signed)
Medication Instructions:   No medication changes made  Stay on the crestor DAILY!  Labwork:  No new labs needed  Testing/Procedures:  No further testing at this time   Follow-Up: It was a pleasure seeing you in the office today. Please call us if you have new issues that need to be addressed before your next appt.  [phone]  Your physician wants you to follow-up in: 6 months.  You will receive a reminder letter in the mail two months in advance. If you don't receive a letter, please call our office to schedule the follow-up appointment.  If you need a refill on your cardiac medications before your next appointment, please call your pharmacy.

## 2017-04-24 ENCOUNTER — Other Ambulatory Visit: Payer: Self-pay | Admitting: Pharmacist

## 2017-04-24 ENCOUNTER — Ambulatory Visit (HOSPITAL_BASED_OUTPATIENT_CLINIC_OR_DEPARTMENT_OTHER): Payer: 59 | Admitting: Pharmacist

## 2017-04-24 DIAGNOSIS — B20 Human immunodeficiency virus [HIV] disease: Secondary | ICD-10-CM | POA: Diagnosis not present

## 2017-04-24 DIAGNOSIS — Z0000 Encounter for general adult medical examination without abnormal findings: Secondary | ICD-10-CM | POA: Diagnosis not present

## 2017-04-24 DIAGNOSIS — I2510 Atherosclerotic heart disease of native coronary artery without angina pectoris: Secondary | ICD-10-CM | POA: Diagnosis not present

## 2017-04-24 DIAGNOSIS — Z79899 Other long term (current) drug therapy: Secondary | ICD-10-CM

## 2017-04-24 DIAGNOSIS — E782 Mixed hyperlipidemia: Secondary | ICD-10-CM | POA: Diagnosis not present

## 2017-04-24 MED ORDER — BICTEGRAVIR-EMTRICITAB-TENOFOV 50-200-25 MG PO TABS: 1.0000 | ORAL_TABLET | Freq: Every day | ORAL | 11 refills | Status: DC

## 2017-04-24 MED FILL — BIKTARVY 50-200-25 MG TABS: 50-200-25 | 30 days supply | Qty: 30 | Fill #0

## 2017-04-24 NOTE — Progress Notes (Signed)
   S: Patient presents today to the [facility] for review of his specialty medication..  The patient is being switched from [place] to [place]. Patient is managed by Dr. Ola Spurr at [facility] for this. He is currently well controlled with an undetectable viral load and great CD4 count.   Adherence: denies any missed doses of Atripla - hasn't started the [place] yet.   Efficacy: HIV well controlled on Atripla but doctor felt that Phillips Odor was a better option (in terms of side effects) so that is why he is swithching  Dosing:  HIV-1 infection: Oral: One tablet once daily.  Drug-drug interactions: none  Monitoring: HIV RNA: undetectable (10/31/2016) - got new labs today. 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 Currently working day shift. Skin reactions: denies Hepatotoxicity: denies Nephrotoxicity: denies S/sx of lactic acidosis: denies S/sx of fractures: denies Fat redistribution: denies  O:     Lab Results  Component Value Date   WBC 4.4 04/20/2015   HGB 15.1 04/20/2015   HCT 43.8 04/20/2015   MCV 95 04/20/2015   PLT 247 04/20/2015      Chemistry      Component Value Date/Time   NA 138 04/20/2015 1058   NA 139 07/21/2014 1521   K 4.6 04/20/2015 1058   K 3.9 07/21/2014 1521   CL 106 04/20/2015 1058   CL 105 07/21/2014 1521   CO2 30 04/20/2015 1058   CO2 28 07/21/2014 1521   BUN 26 (H) 04/20/2015 1058   BUN 29 (H) 07/21/2014 1521   CREATININE 1.16 04/20/2015 1058   CREATININE 1.02 07/21/2014 1521      Component Value Date/Time   CALCIUM 9.1 04/20/2015 1058   CALCIUM 8.8 (L) 07/21/2014 1521   ALKPHOS 96 04/18/2016 1024   ALKPHOS 90 07/21/2014 1521   AST 20 04/18/2016 1024   AST 24 07/21/2014 1521   ALT 14 04/18/2016 1024   ALT 19 07/21/2014 1521   BILITOT 0.3  04/18/2016 1024   BILITOT 0.2 (L) 07/21/2014 1521       A/P: 1. Medication review: patient starting [place] for HIV.  Pertinent labs all WNL, including SCr, LFTs, CBC. Reviewed the medication with him, including the need for regular lab monitoring and adherence to his medication. Discussed  How Phillips Odor will hopefully have less side effects in the long run than Atripla. No recommendations for changes.   Christella Hartigan, PharmD, BCPS, BCACP, [facility] [phone]

## 2017-05-19 MED FILL — BIKTARVY 50-200-25 MG TABS: 50-200-25 | 30 days supply | Qty: 30 | Fill #1

## 2017-06-21 MED FILL — BIKTARVY 50-200-25 MG TABS: 50-200-25 | 30 days supply | Qty: 30 | Fill #2

## 2017-07-07 DIAGNOSIS — L0109 Other impetigo: Secondary | ICD-10-CM | POA: Diagnosis not present

## 2017-07-07 DIAGNOSIS — D485 Neoplasm of uncertain behavior of skin: Secondary | ICD-10-CM | POA: Diagnosis not present

## 2017-07-07 DIAGNOSIS — C44519 Basal cell carcinoma of skin of other part of trunk: Secondary | ICD-10-CM | POA: Diagnosis not present

## 2017-07-18 MED FILL — BIKTARVY 50-200-25 MG TABS: 50-200-25 | 30 days supply | Qty: 30 | Fill #3

## 2017-07-22 DIAGNOSIS — L0109 Other impetigo: Secondary | ICD-10-CM | POA: Diagnosis not present

## 2017-07-29 DIAGNOSIS — B20 Human immunodeficiency virus [HIV] disease: Secondary | ICD-10-CM | POA: Diagnosis not present

## 2017-07-29 DIAGNOSIS — I2510 Atherosclerotic heart disease of native coronary artery without angina pectoris: Secondary | ICD-10-CM | POA: Diagnosis not present

## 2017-07-29 DIAGNOSIS — E782 Mixed hyperlipidemia: Secondary | ICD-10-CM | POA: Diagnosis not present

## 2017-08-05 DIAGNOSIS — B20 Human immunodeficiency virus [HIV] disease: Secondary | ICD-10-CM | POA: Diagnosis not present

## 2017-08-11 MED FILL — BIKTARVY 50-200-25 MG TABS: 50-200-25 | 30 days supply | Qty: 30 | Fill #4

## 2017-09-07 MED FILL — BIKTARVY 50-200-25 MG TABS: 50-200-25 | 30 days supply | Qty: 30 | Fill #5

## 2017-09-21 DIAGNOSIS — C4491 Basal cell carcinoma of skin, unspecified: Secondary | ICD-10-CM | POA: Diagnosis not present

## 2017-09-21 DIAGNOSIS — C44519 Basal cell carcinoma of skin of other part of trunk: Secondary | ICD-10-CM | POA: Diagnosis not present

## 2017-09-24 DIAGNOSIS — Z1159 Encounter for screening for other viral diseases: Secondary | ICD-10-CM | POA: Diagnosis not present

## 2017-09-24 DIAGNOSIS — Z8249 Family history of ischemic heart disease and other diseases of the circulatory system: Secondary | ICD-10-CM | POA: Diagnosis not present

## 2017-09-24 DIAGNOSIS — I2510 Atherosclerotic heart disease of native coronary artery without angina pectoris: Secondary | ICD-10-CM | POA: Diagnosis not present

## 2017-09-24 DIAGNOSIS — Z23 Encounter for immunization: Secondary | ICD-10-CM | POA: Diagnosis not present

## 2017-09-24 DIAGNOSIS — E785 Hyperlipidemia, unspecified: Secondary | ICD-10-CM | POA: Diagnosis not present

## 2017-09-24 DIAGNOSIS — B20 Human immunodeficiency virus [HIV] disease: Secondary | ICD-10-CM | POA: Diagnosis not present

## 2017-09-24 DIAGNOSIS — Z1389 Encounter for screening for other disorder: Secondary | ICD-10-CM | POA: Diagnosis not present

## 2017-10-13 MED FILL — BIKTARVY 50-200-25 MG TABS: 50-200-25 | 30 days supply | Qty: 30 | Fill #6

## 2017-11-09 MED FILL — BIKTARVY 50-200-25 MG TABS: 50-200-25 | 30 days supply | Qty: 30 | Fill #7

## 2017-11-16 ENCOUNTER — Inpatient Hospital Stay
Admission: EM | Admit: 2017-11-16 | Discharge: 2017-11-20 | DRG: 246 | Disposition: A | Discharge status: Home / Self Care | Payer: 59 | Attending: Internal Medicine | Admitting: Internal Medicine

## 2017-11-16 ENCOUNTER — Encounter: Payer: Self-pay | Admitting: Physician Assistant

## 2017-11-16 ENCOUNTER — Encounter
Admission: EM | Disposition: A | Discharge status: Home / Self Care | Payer: Self-pay | Source: Home / Self Care | Attending: Internal Medicine

## 2017-11-16 ENCOUNTER — Inpatient Hospital Stay (HOSPITAL_COMMUNITY)
Admit: 2017-11-16 | Discharge: 2017-11-16 | Disposition: A | Discharge status: Home / Self Care | Payer: 59 | Attending: Cardiovascular Disease | Admitting: Cardiovascular Disease

## 2017-11-16 ENCOUNTER — Emergency Department: Admit: 2017-11-16 | Payer: 59 | Admitting: Cardiovascular Disease

## 2017-11-16 ENCOUNTER — Ambulatory Visit (INDEPENDENT_AMBULATORY_CARE_PROVIDER_SITE_OTHER)
Admission: EM | Admit: 2017-11-16 | Discharge: 2017-11-16 | Disposition: A | Discharge status: Other Acute Inpatient Hospital | Payer: 59 | Source: Home / Self Care | Attending: Family Medicine | Admitting: Family Medicine

## 2017-11-16 ENCOUNTER — Other Ambulatory Visit: Payer: Self-pay

## 2017-11-16 ENCOUNTER — Encounter: Payer: Self-pay | Admitting: Emergency Medicine

## 2017-11-16 DIAGNOSIS — E785 Hyperlipidemia, unspecified: Secondary | ICD-10-CM | POA: Diagnosis present

## 2017-11-16 DIAGNOSIS — Z7982 Long term (current) use of aspirin: Secondary | ICD-10-CM | POA: Diagnosis not present

## 2017-11-16 DIAGNOSIS — I472 Ventricular tachycardia: Secondary | ICD-10-CM | POA: Diagnosis not present

## 2017-11-16 DIAGNOSIS — I4901 Ventricular fibrillation: Secondary | ICD-10-CM | POA: Diagnosis not present

## 2017-11-16 DIAGNOSIS — B20 Human immunodeficiency virus [HIV] disease: Secondary | ICD-10-CM | POA: Diagnosis present

## 2017-11-16 DIAGNOSIS — I2119 ST elevation (STEMI) myocardial infarction involving other coronary artery of inferior wall: Secondary | ICD-10-CM | POA: Diagnosis present

## 2017-11-16 DIAGNOSIS — I213 ST elevation (STEMI) myocardial infarction of unspecified site: Secondary | ICD-10-CM

## 2017-11-16 DIAGNOSIS — I252 Old myocardial infarction: Secondary | ICD-10-CM | POA: Diagnosis not present

## 2017-11-16 DIAGNOSIS — Z79899 Other long term (current) drug therapy: Secondary | ICD-10-CM | POA: Diagnosis not present

## 2017-11-16 DIAGNOSIS — R570 Cardiogenic shock: Secondary | ICD-10-CM | POA: Diagnosis not present

## 2017-11-16 DIAGNOSIS — N179 Acute kidney failure, unspecified: Secondary | ICD-10-CM | POA: Diagnosis not present

## 2017-11-16 DIAGNOSIS — R079 Chest pain, unspecified: Secondary | ICD-10-CM | POA: Diagnosis not present

## 2017-11-16 DIAGNOSIS — Z87891 Personal history of nicotine dependence: Secondary | ICD-10-CM

## 2017-11-16 DIAGNOSIS — Z955 Presence of coronary angioplasty implant and graft: Secondary | ICD-10-CM | POA: Diagnosis not present

## 2017-11-16 DIAGNOSIS — Z21 Asymptomatic human immunodeficiency virus [HIV] infection status: Secondary | ICD-10-CM | POA: Diagnosis not present

## 2017-11-16 DIAGNOSIS — I2510 Atherosclerotic heart disease of native coronary artery without angina pectoris: Secondary | ICD-10-CM | POA: Diagnosis not present

## 2017-11-16 DIAGNOSIS — E782 Mixed hyperlipidemia: Secondary | ICD-10-CM | POA: Diagnosis not present

## 2017-11-16 DIAGNOSIS — I2111 ST elevation (STEMI) myocardial infarction involving right coronary artery: Secondary | ICD-10-CM

## 2017-11-16 DIAGNOSIS — Z85828 Personal history of other malignant neoplasm of skin: Secondary | ICD-10-CM | POA: Diagnosis not present

## 2017-11-16 DIAGNOSIS — Z981 Arthrodesis status: Secondary | ICD-10-CM | POA: Diagnosis not present

## 2017-11-16 HISTORY — PX: LEFT HEART CATH AND CORONARY ANGIOGRAPHY: CATH118249

## 2017-11-16 HISTORY — PX: CORONARY/GRAFT ACUTE MI REVASCULARIZATION: CATH118305

## 2017-11-16 LAB — CBC WITH DIFFERENTIAL/PLATELET
BASOS ABS: 0.1 10*3/uL (ref 0–0.1)
BASOS PCT: 1 %
Eosinophils Absolute: 0.2 10*3/uL (ref 0–0.7)
Eosinophils Relative: 2 %
HEMATOCRIT: 47.8 % (ref 40.0–52.0)
HEMOGLOBIN: 16.3 g/dL (ref 13.0–18.0)
LYMPHS PCT: 35 %
Lymphs Abs: 2.5 10*3/uL (ref 1.0–3.6)
MCH: 33 pg (ref 26.0–34.0)
MCHC: 34.1 g/dL (ref 32.0–36.0)
MCV: 96.7 fL (ref 80.0–100.0)
MONO ABS: 0.5 10*3/uL (ref 0.2–1.0)
MONOS PCT: 8 %
NEUTROS ABS: 3.9 10*3/uL (ref 1.4–6.5)
NEUTROS PCT: 54 %
Platelets: 268 10*3/uL (ref 150–440)
RBC: 4.95 MIL/uL (ref 4.40–5.90)
RDW: 13.5 % (ref 11.5–14.5)
WBC: 7.2 10*3/uL (ref 3.8–10.6)

## 2017-11-16 LAB — BASIC METABOLIC PANEL
Anion gap: 9 (ref 5–15)
BUN: 21 mg/dL — ABNORMAL HIGH (ref 6–20)
CO2: 25 mmol/L (ref 22–32)
Calcium: 9.4 mg/dL (ref 8.9–10.3)
Chloride: 106 mmol/L (ref 98–111)
Creatinine, Ser: 1.57 mg/dL — ABNORMAL HIGH (ref 0.61–1.24)
GFR calc Af Amer: 55 mL/min — ABNORMAL LOW (ref >60)
GFR calc non Af Amer: 48 mL/min — ABNORMAL LOW (ref >60)
Glucose, Bld: 111 mg/dL — ABNORMAL HIGH (ref 70–99)
Potassium: 3.7 mmol/L (ref 3.5–5.1)
Sodium: 140 mmol/L (ref 135–145)

## 2017-11-16 LAB — COMPREHENSIVE METABOLIC PANEL
ALT: 22 U/L (ref 0–44)
AST: 53 U/L — ABNORMAL HIGH (ref 15–41)
Albumin: 4.6 g/dL (ref 3.5–5.0)
Alkaline Phosphatase: 72 U/L (ref 38–126)
Anion gap: 15 (ref 5–15)
BUN: 20 mg/dL (ref 6–20)
CO2: 22 mmol/L (ref 22–32)
Calcium: 9.4 mg/dL (ref 8.9–10.3)
Chloride: 104 mmol/L (ref 98–111)
Creatinine, Ser: 1.88 mg/dL — ABNORMAL HIGH (ref 0.61–1.24)
GFR calc Af Amer: 44 mL/min — ABNORMAL LOW (ref >60)
GFR calc non Af Amer: 38 mL/min — ABNORMAL LOW (ref >60)
Glucose, Bld: 109 mg/dL — ABNORMAL HIGH (ref 70–99)
Potassium: 4.8 mmol/L (ref 3.5–5.1)
Sodium: 141 mmol/L (ref 135–145)
Total Bilirubin: 1 mg/dL (ref 0.3–1.2)
Total Protein: 7.6 g/dL (ref 6.5–8.1)

## 2017-11-16 LAB — CBC
HCT: 43.7 % (ref 40.0–52.0)
Hemoglobin: 15.3 g/dL (ref 13.0–18.0)
MCH: 33.8 pg (ref 26.0–34.0)
MCHC: 35.1 g/dL (ref 32.0–36.0)
MCV: 96.4 fL (ref 80.0–100.0)
Platelets: 260 10*3/uL (ref 150–440)
RBC: 4.53 MIL/uL (ref 4.40–5.90)
RDW: 13.7 % (ref 11.5–14.5)
WBC: 6.4 10*3/uL (ref 3.8–10.6)

## 2017-11-16 LAB — MAGNESIUM: MAGNESIUM: 2.2 mg/dL (ref 1.7–2.4)

## 2017-11-16 LAB — ECHOCARDIOGRAM COMPLETE
Height: 70 in
WEIGHTICAEL: 2913.6 [oz_av]

## 2017-11-16 LAB — HEMOGLOBIN A1C
HEMOGLOBIN A1C: 5.3 % (ref 4.8–5.6)
MEAN PLASMA GLUCOSE: 105.41 mg/dL

## 2017-11-16 LAB — TROPONIN I
TROPONIN I: 0.31 ng/mL — AB (ref <0.03)
TROPONIN I: 0.56 ng/mL — AB (ref <0.03)
Troponin I: 5.67 ng/mL (ref <0.03)

## 2017-11-16 LAB — PROTIME-INR
INR: 0.9
Prothrombin Time: 12.1 seconds (ref 11.4–15.2)

## 2017-11-16 LAB — POCT ACTIVATED CLOTTING TIME
Activated Clotting Time: 224 seconds
Activated Clotting Time: 252 seconds

## 2017-11-16 LAB — GLUCOSE, CAPILLARY: Glucose-Capillary: 99 mg/dL (ref 70–99)

## 2017-11-16 LAB — MRSA PCR SCREENING: MRSA BY PCR: NEGATIVE

## 2017-11-16 LAB — APTT: aPTT: 25 seconds (ref 24–36)

## 2017-11-16 SURGERY — CORONARY/GRAFT ACUTE MI REVASCULARIZATION
Anesthesia: Moderate Sedation

## 2017-11-16 MED ORDER — DOPAMINE-DEXTROSE 3.2-5 MG/ML-% IV SOLN
INTRAVENOUS | Status: DC | PRN
  Administered 2017-11-16: 10 ug/kg/min via INTRAVENOUS

## 2017-11-16 MED ORDER — HEPARIN (PORCINE) IN NACL 1000-0.9 UT/500ML-% IV SOLN
INTRAVENOUS | Status: AC
  Filled 2017-11-16: qty 1000

## 2017-11-16 MED ORDER — TICAGRELOR 90 MG PO TABS
ORAL_TABLET | ORAL | Status: DC | PRN
  Administered 2017-11-16: 180 mg via ORAL

## 2017-11-16 MED ORDER — VERAPAMIL HCL 2.5 MG/ML IV SOLN
INTRAVENOUS | Status: AC
  Filled 2017-11-16: qty 2

## 2017-11-16 MED ORDER — ONE-DAILY MULTI VITAMINS PO TABS: 1.0000 | ORAL_TABLET | Freq: Every day | ORAL | Status: DC

## 2017-11-16 MED ORDER — HEPARIN SODIUM (PORCINE) 1000 UNIT/ML IJ SOLN
INTRAMUSCULAR | Status: AC
  Filled 2017-11-16: qty 1

## 2017-11-16 MED ORDER — ACETAMINOPHEN 325 MG PO TABS
650.0000 mg | ORAL_TABLET | ORAL | Status: DC | PRN
  Administered 2017-11-19: 650 mg via ORAL

## 2017-11-16 MED ORDER — ASPIRIN 81 MG PO CHEW
81.0000 mg | CHEWABLE_TABLET | Freq: Every day | ORAL | Status: DC
  Administered 2017-11-17 – 2017-11-19 (×3): 81 mg via ORAL
  Filled 2017-11-16 (×3): qty 1

## 2017-11-16 MED ORDER — DOPAMINE-DEXTROSE 3.2-5 MG/ML-% IV SOLN
INTRAVENOUS | Status: AC
Start: 2017-11-16 — End: ?
  Filled 2017-11-16: qty 250

## 2017-11-16 MED ORDER — SODIUM CHLORIDE 0.9 % IV SOLN: 250.0000 mL | INTRAVENOUS | Status: DC | PRN

## 2017-11-16 MED ORDER — TICAGRELOR 90 MG PO TABS
90.0000 mg | ORAL_TABLET | Freq: Two times a day (BID) | ORAL | Status: DC
  Administered 2017-11-16 – 2017-11-19 (×7): 90 mg via ORAL
  Filled 2017-11-16 (×9): qty 1

## 2017-11-16 MED ORDER — HEPARIN SODIUM (PORCINE) 1000 UNIT/ML IJ SOLN
INTRAMUSCULAR | Status: DC | PRN
  Administered 2017-11-16: 2000 [IU] via INTRAVENOUS
  Administered 2017-11-16: 5000 [IU] via INTRAVENOUS

## 2017-11-16 MED ORDER — BICTEGRAVIR-EMTRICITAB-TENOFOV 50-200-25 MG PO TABS
1.0000 | ORAL_TABLET | Freq: Every day | ORAL | Status: DC
  Administered 2017-11-17: 1 via ORAL
  Filled 2017-11-16 (×4): qty 1

## 2017-11-16 MED ORDER — SODIUM CHLORIDE 0.9% FLUSH
3.0000 mL | Freq: Two times a day (BID) | INTRAVENOUS | Status: DC
  Administered 2017-11-16 – 2017-11-19 (×3): 3 mL via INTRAVENOUS

## 2017-11-16 MED ORDER — SODIUM CHLORIDE 0.9% FLUSH
3.0000 mL | INTRAVENOUS | Status: DC | PRN
  Administered 2017-11-19: 3 mL via INTRAVENOUS
  Filled 2017-11-16: qty 3

## 2017-11-16 MED ORDER — SODIUM CHLORIDE 0.9 % IV SOLN
INTRAVENOUS | Status: AC
  Administered 2017-11-16: 13:00:00 via INTRAVENOUS

## 2017-11-16 MED ORDER — ADULT MULTIVITAMIN W/MINERALS CH
1.0000 | ORAL_TABLET | Freq: Every day | ORAL | Status: DC
  Administered 2017-11-16 – 2017-11-18 (×3): 1 via ORAL
  Filled 2017-11-16 (×3): qty 1

## 2017-11-16 MED ORDER — HEPARIN (PORCINE) IN NACL 100-0.45 UNIT/ML-% IJ SOLN: 950.0000 [IU]/h | INTRAMUSCULAR | Status: DC

## 2017-11-16 MED ORDER — ROSUVASTATIN CALCIUM 10 MG PO TABS
40.0000 mg | ORAL_TABLET | Freq: Every day | ORAL | Status: DC
  Administered 2017-11-17 – 2017-11-19 (×3): 40 mg via ORAL
  Filled 2017-11-16 (×3): qty 4

## 2017-11-16 MED ORDER — LIDOCAINE HCL (PF) 1 % IJ SOLN
INTRAMUSCULAR | Status: AC
  Filled 2017-11-16: qty 30

## 2017-11-16 MED ORDER — ONDANSETRON HCL 4 MG/2ML IJ SOLN: 4.0000 mg | Freq: Four times a day (QID) | INTRAMUSCULAR | Status: DC | PRN

## 2017-11-16 MED ORDER — HEPARIN SODIUM (PORCINE) 5000 UNIT/ML IJ SOLN
4000.0000 [IU] | Freq: Once | INTRAMUSCULAR | Status: AC
  Administered 2017-11-16: 4000 [IU] via INTRAVENOUS

## 2017-11-16 MED ORDER — ATROPINE SULFATE 1 MG/10ML IJ SOSY
PREFILLED_SYRINGE | INTRAMUSCULAR | Status: AC
  Filled 2017-11-16: qty 10

## 2017-11-16 MED ORDER — TICAGRELOR 90 MG PO TABS
ORAL_TABLET | ORAL | Status: AC
  Filled 2017-11-16: qty 2

## 2017-11-16 SURGICAL SUPPLY — 15 items
BALLN MINITREK RX 2.0X12 (BALLOONS) ×2
BALLOON MINITREK RX 2.0X12 (BALLOONS) ×1 IMPLANT
CATH INFINITI 5FR ANG PIGTAIL (CATHETERS) ×2 IMPLANT
CATH INFINITI 5FR JK (CATHETERS) ×2 IMPLANT
CATH LAUNCHER 6FR JR4 (CATHETERS) ×2 IMPLANT
DEVICE INFLAT 30 PLUS (MISCELLANEOUS) ×2 IMPLANT
DEVICE RAD TR BAND REGULAR (VASCULAR PRODUCTS) ×2 IMPLANT
KIT MANI 3VAL PERCEP (MISCELLANEOUS) ×2 IMPLANT
NEEDLE PERC 21GX4CM (NEEDLE) ×2 IMPLANT
PACK CARDIAC CATH (CUSTOM PROCEDURE TRAY) ×2 IMPLANT
SHEATH RAIN RADIAL 21G 6FR (SHEATH) ×2 IMPLANT
STENT RESOLUTE ONYX 2.5X15 (Permanent Stent) ×2 IMPLANT
STENT RESOLUTE ONYX 3.5X12 (Permanent Stent) ×2 IMPLANT
WIRE INTUITION PROPEL ST 180CM (WIRE) ×2 IMPLANT
WIRE ROSEN-J .035X260CM (WIRE) ×2 IMPLANT

## 2017-11-16 NOTE — ED Notes (Signed)
Dr. Fletcher Anon at bedside in ed with pt. Pt with pads placed per RN.

## 2017-11-16 NOTE — H&P (Signed)
[facility] [place] NAME: Winston Misner    MR#:  [id number]  DATE OF BIRTH:  19-Oct-1960  DATE OF ADMISSION:  11/16/2017  PRIMARY CARE PHYSICIAN: [facility]  REQUESTING/REFERRING PHYSICIAN: Dr. Kathlyn [place]  CHIEF COMPLAINT:   Chief Complaint  Patient presents with  . Code STEMI    HISTORY OF PRESENT ILLNESS:  Dhyan Noah  is a 57 y.o. male with a known history of HIV, nephrolithiasis, hyperlipidemia, degenerative disc disease status post lumbar fusion, history of coronary artery disease with previous stent placement who presents to the hospital due to chest pain/tightness and noted to have an ST elevation MI.  Patient was at his usual state of health when working out at the gym today on the treadmill he started to develop chest pain/tightness.  Patient's chest pain/tightness was associated with some nausea but no vomiting some diaphoresis no palpitations or syncope.  Patient said he had similar symptoms like this about a week or so ago but resolved after he took some aspirin.  His symptoms were progressively getting worse today at the gym so he drove himself from the gym to the urgent care in [facility], he had a EKG done there which showed ST elevations in the inferior leads and he was therefore rushed to the emergency room at [facility] and taken to the cardiac catheterization lab.  Patient underwent 2 stents to his RCA and post cardiac catheterization and stent placement he is now chest pain-free and stable.  Patient also had V. fib arrest shortly before obtaining arterial access in the cardiac catheterization lab but quickly solved with 1 defibrillation.  Patient was noted to have a mid circumflex lesion which also needs intervention but this is likely to be done in the next 1 to 2 days.  Patient presently denies any chest pain, shortness of breath nausea vomiting fever chills or any other associated symptoms.  PAST  MEDICAL HISTORY:   Past Medical History:  Diagnosis Date  . Asymptomatic HIV infection ([place])   . Bell's palsy 2004  . CAD (coronary artery disease)    a. MI in 2004 w/ PCI to LAD and mRCA; b. LHC 07/2008: patent LAD stent, 30% distal LAD stenosis, 25% proximal LCx stenosis, 20% mid LCx stenosis, 95% mid RCA stenosis s/p PCI/DES (Xience 3.5 x 28 mm), and 40% distal RCA stenosis  . DDD (degenerative disc disease) 10/02   has lumbar fusion  . HLD (hyperlipidemia)    mixed  . Myocardial infarction ([place]) 2004  . Nephrolithiasis   . Skin cancer, basal cell     PAST SURGICAL HISTORY:   Past Surgical History:  Procedure Laterality Date  . AMPUTATION  1985   L fingers reattachement   . CARDIAC CATHETERIZATION     x2. 2004, 2010. 3 stents(total)  . CHOLECYSTECTOMY  3/03  . COLONOSCOPY WITH PROPOFOL N/A 06/13/2016   Procedure: COLONOSCOPY WITH PROPOFOL;  Surgeon: Lucilla Lame, MD;  Location: [facility];  Service: Endoscopy;  Laterality: N/A;  . CORONARY/GRAFT ACUTE MI REVASCULARIZATION N/A 11/16/2017   Procedure: Coronary/Graft Acute MI Revascularization;  Surgeon: [name], MD;  Location: [facility];  Service: Cardiovascular;  Laterality: N/A;  . LEFT HEART CATH AND CORONARY ANGIOGRAPHY N/A 11/16/2017   Procedure: LEFT HEART CATH AND CORONARY ANGIOGRAPHY;  Surgeon: [name], MD;  Location: [facility];  Service: Cardiovascular;  Laterality: N/A;  . POLYPECTOMY  06/13/2016   Procedure: POLYPECTOMY;  Surgeon:  Lucilla Lame, MD;  Location: [facility];  Service: Endoscopy;;  . WRIST SURGERY  10/2011    SOCIAL HISTORY:   Social History   Tobacco Use  . Smoking status: Former Smoker    Last attempt to quit: 04/07/2010    Years since quitting: 7.6  . Smokeless tobacco: Never Used  Substance Use Topics  . Alcohol use: No    Comment: occasional (1x/mo)    FAMILY HISTORY:   Family History  Problem Relation Age of Onset  . Heart disease  Father   . Coronary artery disease Unknown        family hx    DRUG ALLERGIES:  No Known Allergies  REVIEW OF SYSTEMS:   Review of Systems  Constitutional: Negative for chills, fever and weight loss.  HENT: Negative for congestion, nosebleeds and tinnitus.   Eyes: Negative for blurred vision, double vision and redness.  Respiratory: Negative for cough, hemoptysis, shortness of breath and wheezing.   Cardiovascular: Negative for chest pain, orthopnea, leg swelling and PND.  Gastrointestinal: Negative for abdominal pain, diarrhea, melena, nausea and vomiting.  Genitourinary: Negative for dysuria, hematuria and urgency.  Musculoskeletal: Negative for falls and joint pain.  Neurological: Negative for dizziness, tingling, sensory change, focal weakness, seizures, weakness and headaches.  Endo/Heme/Allergies: Negative for polydipsia. Does not bruise/bleed easily.  Psychiatric/Behavioral: Negative for depression and memory loss. The patient is not nervous/anxious.   All other systems reviewed and are negative.   MEDICATIONS AT HOME:   Prior to Admission medications   Medication Sig Start Date End Date Taking? Authorizing Provider  aspirin 325 MG EC tablet Take 325 mg by mouth daily.     Yes [provider]  bictegravir-emtricitabine-tenofovir AF (BIKTARVY) 50-200-25 MG TABS tablet Take 1 tablet by mouth daily. 04/24/17  Yes Tresa Garter, MD  Multiple Vitamin (MULTIVITAMIN) tablet Take 1 tablet by mouth daily.   Yes [provider]  rosuvastatin (CRESTOR) 40 MG tablet Take 1 tablet (40 mg total) by mouth daily. Patient not taking: Reported on 11/16/2017 04/21/16 11/16/17  [name], MD      VITAL SIGNS:  Blood pressure 100/69, pulse (!) 55, temperature (!) 97.5 F (36.4 C), temperature source Oral, resp. rate 11, height 5\' 10"  (1.778 m), weight 82.6 kg, SpO2 99 %.  PHYSICAL EXAMINATION:  Physical Exam  GENERAL:  57 y.o.-year-old patient lying in the  bed in no acute distress.  EYES: Pupils equal, round, reactive to light and accommodation. No scleral icterus. Extraocular muscles intact.  HEENT: Head atraumatic, normocephalic. Oropharynx and nasopharynx clear. No oropharyngeal erythema, moist oral mucosa  NECK:  Supple, no jugular venous distention. No thyroid enlargement, no tenderness.  LUNGS: Normal breath sounds bilaterally, no wheezing, rales, rhonchi. No use of accessory muscles of respiration.  CARDIOVASCULAR: S1, S2 RRR. No murmurs, rubs, gallops, clicks.  ABDOMEN: Soft, nontender, nondistended. Bowel sounds present. No organomegaly or mass.  EXTREMITIES: No pedal edema, cyanosis, or clubbing. + 2 pedal & radial pulses b/l.   NEUROLOGIC: Cranial nerves II through XII are intact. No focal Motor or sensory deficits appreciated b/l PSYCHIATRIC: The patient is alert and oriented x 3. Good affect.  SKIN: No obvious rash, lesion, or ulcer.   LABORATORY PANEL:   CBC Recent Labs  Lab 11/16/17 1144  WBC 6.4  HGB 15.3  HCT 43.7  PLT 260   ------------------------------------------------------------------------------------------------------------------  Chemistries  Recent Labs  Lab 11/16/17 1106 11/16/17 1144  NA 141 140  K 4.8 3.7  CL  104 106  CO2 22 25  GLUCOSE 109* 111*  BUN 20 21*  CREATININE 1.88* 1.57*  CALCIUM 9.4 9.4  MG  --  2.2  AST 53*  --   ALT 22  --   ALKPHOS 72  --   BILITOT 1.0  --    ------------------------------------------------------------------------------------------------------------------  Cardiac Enzymes Recent Labs  Lab 11/16/17 1329  TROPONINI 0.56*   ------------------------------------------------------------------------------------------------------------------  RADIOLOGY:  No results found.   IMPRESSION AND PLAN:   57 year old male with past medical history of HIV, hyperlipidemia, coronary disease status post previous stent, who presents to the hospital due to chest pain  noted to have an ST elevation MI.  1.  ST elevation MI-this was the cause of patient's chest pain.  Patient was urgently taken to cardiac catheterization lab and underwent 2 drug-eluting stents to his RCA.  Patient was noted to have an inferior MI based on his EKG. -Post intervention patient is now chest pain-free and hemodynamically stable.  Patient was noted to have 95% stenosis to his left circumflex.  He is going to have a staged PCI with intervention in the next 2 days. -Continue aspirin, Brilinta, Crestor.  Cannot tolerate beta-blocker/ACE inhibitor given his relative hypotension presently. -Appreciate cardiology consult, await echocardiogram results and continue further care as per them.  2.  History of HIV-continue HAART.  3.  Hyperlipidemia-continue Crestor.   All the records are reviewed and case discussed with ED provider. Management plans discussed with the patient, family and they are in agreement.  CODE STATUS: Full Code  TOTAL TIME TAKING CARE OF THIS PATIENT: 40 minutes.    Henreitta Leber M.D on 11/16/2017 at 2:20 PM  Between 7am to 6pm - Pager - [phone]  After 6pm go to [email or web address] - password [email or web address] [facility]  Office  [phone]  CC: Primary care physician; Patient, No Pcp Per

## 2017-11-16 NOTE — ED Provider Notes (Addendum)
[facility]    CSN: [id number] Arrival date & time: 11/16/17  1051  History   Chief Complaint Chief Complaint  Patient presents with  . Chest Pain   HPI  57 year old male with a history of coronary artery disease status post MI x2 and PCI presents with chest pain.  Patient states that he had a bout of chest pain a week ago that was similar.  This morning he was at the gym exercising.  Started approximately 5 mins prior to arrival. While he was on the treadmill he developed chest pain.  Substernal, 7/10 in severity, and radiating to the left shoulder blade.  No reports of diaphoresis although he was exercising at the time.  Not currently short of breath.  He took aspirin this morning.  He has not been particularly compliant with his Crestor.  Patient long-standing history of coronary artery disease.  No other associated symptoms.  No other complaints.  Past Medical History:  Diagnosis Date  . Asymptomatic HIV infection ([place])   . Bell's palsy 2004  . CAD (coronary artery disease)    MI - stent palcement; stress myoview 4/10, repeat stent 4/10   . DDD (degenerative disc disease) 10/02   has lumbar fusion  . HLD (hyperlipidemia)    mixed  . Myocardial infarction ([place])    x2, approx 2004  . Nephrolithiasis   . Skin cancer, basal cell     Patient Active Problem List   Diagnosis Date Noted  . Special screening for malignant neoplasms, colon   . Benign neoplasm of ascending colon   . Human immunodeficiency virus (HIV) disease ([place]) 11/25/2009  . Hyperlipidemia 11/23/2009  . Coronary atherosclerosis 11/23/2009  . Chest pain 11/23/2009    Past Surgical History:  Procedure Laterality Date  . AMPUTATION  1985   L fingers reattachement   . CARDIAC CATHETERIZATION     x2. 2004, 2010. 3 stents(total)  . CHOLECYSTECTOMY  3/03  . COLONOSCOPY WITH PROPOFOL N/A 06/13/2016   Procedure: COLONOSCOPY WITH PROPOFOL;  Surgeon: Lucilla Lame, MD;  Location: [facility];   Service: Endoscopy;  Laterality: N/A;  . POLYPECTOMY  06/13/2016   Procedure: POLYPECTOMY;  Surgeon: Lucilla Lame, MD;  Location: [facility];  Service: Endoscopy;;  . WRIST SURGERY  10/2011   Home Medications    Prior to Admission medications   Medication Sig Start Date End Date Taking? Authorizing Provider  aspirin 325 MG EC tablet Take 325 mg by mouth daily.     Yes [provider]  bictegravir-emtricitabine-tenofovir AF (BIKTARVY) 50-200-25 MG TABS tablet Take 1 tablet by mouth daily. 04/24/17  Yes Tresa Garter, MD  Multiple Vitamin (MULTIVITAMIN) tablet Take 1 tablet by mouth daily.   Yes [provider]  rosuvastatin (CRESTOR) 40 MG tablet Take 1 tablet (40 mg total) by mouth daily. 04/21/16 11/16/17 Yes [name], MD    Family History Family History  Problem Relation Age of Onset  . Heart disease Father   . Coronary artery disease Unknown        family hx    Social History Social History   Tobacco Use  . Smoking status: Former Smoker    Last attempt to quit: 04/07/2010    Years since quitting: 7.6  . Smokeless tobacco: Never Used  Substance Use Topics  . Alcohol use: No    Comment: occasional (1x/mo)  . Drug use: No     Allergies   Patient has no known allergies.   Review  of Systems Review of Systems  Constitutional: Negative.   Respiratory: Negative for shortness of breath.   Cardiovascular: Positive for chest pain.   Physical Exam Triage Vital Signs ED Triage Vitals  Enc Vitals Group     BP 11/16/17 1057 (!) 122/92     Pulse Rate 11/16/17 1057 61     Resp 11/16/17 1057 18     Temp 11/16/17 1057 (!) 97.5 F (36.4 C)     Temp Source 11/16/17 1057 Oral     SpO2 --      Weight 11/16/17 1059 175 lb (79.4 kg)     Height 11/16/17 1059 5\' 10"  (1.778 m)     Head Circumference --      Peak Flow --      Pain Score 11/16/17 1059 7     Pain Loc --      Pain Edu? --      Excl. in [place]? --    Updated Vital Signs BP (!)  122/92 (BP Location: Left Arm)   Pulse 61   Temp (!) 97.5 F (36.4 C) (Oral)   Resp 18   Ht 5\' 10"  (1.778 m)   Wt 79.4 kg   BMI 25.11 kg/m   Visual Acuity Right Eye Distance:   Left Eye Distance:   Bilateral Distance:    Right Eye Near:   Left Eye Near:    Bilateral Near:     Physical Exam  Constitutional: He is oriented to person, place, and time. He appears well-developed.  No acute distress.  HENT:  Head: Normocephalic and atraumatic.  Cardiovascular: Normal rate and regular rhythm.  Pulmonary/Chest: Effort normal. No respiratory distress. He has no wheezes. He has no rales.  Neurological: He is alert and oriented to person, place, and time.  Psychiatric: He has a normal mood and affect. His behavior is normal.  Nursing note and vitals reviewed.  UC Treatments / Results  Labs (all labs ordered are listed, but only abnormal results are displayed) Labs Reviewed  COMPREHENSIVE METABOLIC PANEL  TROPONIN I  CBC WITH DIFFERENTIAL/PLATELET   EKG Interpretation: Normal sinus rhythm at the rate of 67.  Normal axis.  Acute ST elevations in lead II, III and aVF.    Radiology No results found.  Procedures Procedures (including critical care time)  Medications Ordered in UC Medications - No data to display  Initial Impression / Assessment and Plan / UC Course  I have reviewed the triage vital signs and the nursing notes.  Pertinent labs & imaging results that were available during my care of the patient were reviewed by me and considered in my medical decision making (see chart for details).    57 year old male presents with STEMI.  Patient had already taken aspirin this morning.  Labs obtained, IV placed.  911 call.  Patient being transported via EMS to hospital.  Needs to go straight to the Cath Lab.  Code STEMI.  Final Clinical Impressions(s) / UC Diagnoses   Final diagnoses:  Acute ST elevation myocardial infarction (STEMI), unspecified artery [facility])    Discharge Instructions   None    ED Prescriptions    None     Controlled Substance Prescriptions [place] Controlled Substance Registry consulted? Not Applicable   Coral Spikes, DO 11/16/17 1115    Thersa Salt G, DO 11/16/17 1118

## 2017-11-16 NOTE — Consult Note (Signed)
Patient arrived to ICU after uncomplicated LHC for inferior wall STEMI.  PCCM service is available as needed.  Merton Border, MD PCCM service Mobile [phone] Pager [phone] 11/16/2017 4:25 PM

## 2017-11-16 NOTE — Consult Note (Signed)
Cardiology Consultation:   Patient ID: Daniel Black; [id number]; 1961/01/02   Admit date: 11/16/2017 Date of Consult: 11/16/2017  Primary Care Provider: Patient, No Pcp Per Primary Cardiologist: Rockey Situ   Patient Profile:   Daniel Black is a 57 y.o. male with a hx of CAD with an MI in 2004 s/p PCI to the LAD and RCA at that time s/p repeat PCI to the RCA in 2010, HIV, HLD with noncompliance in statin therapy, Bell's palsy, DDD, kidney stones, and remote tobacco abuse who is being seen today for the evaluation of inferior STEMI at the request of Dr. Jacqualine Code.  History of Present Illness:   Daniel Black most recently underwent LHC in 07/2008 that showed patent LAD stent, 30% distal LAD stenosis, 25% proximal LCx stenosis, 20% mid LCx stenosis, 95% mid RCA stenosis s/p PCI/DES (Xience 3.5 x 28 mm), and 40% distal RCA stenosis. Prior EF normal by Myoview in 2010. He was most recently seen in the office in 04/2017 for routine 61-monthfollow up. At that time he noted some weight gain with rare episodes of chest pain. He was continued on ASA 325 mg daily as well as Crestor 40 mg daily. Most recent LDL of 89 from 04/2017. Liver function normal as of 07/2017. Viral load < 20 in 07/2017 with a CD4 > 600 as of 04/2017.   Patient was seen at an outside urgent care this morning with chest pain. Patient reported chest pain while exercising at the gym this morning. He apprently had simlar pain ~ 1 week prior as well. Pain was substernal and rated 7/10 that radiated to the left shoulder. No associated diahoresis (though was exercising at the time). EKG checked at that office showed an acute inferior STEMI with reciprocal changes. He had taken his ASA 325 mg already prior to going to the gym. BP stable. Labs checked at urgent care showed a SCR of 1.88, glucose 109, K+ 4.8 with hemolysis, AST 53, cbc unremarkable. Troponin 0.31. He was transferred emergently via EMS. He received fentanyl and Zofran en route.  [facility] by Dr. AFletcher Anonin the ED and taken emergently to the cath lab with results pending at this time.    Past Medical History:  Diagnosis Date  . Asymptomatic HIV infection ([place]   . Bell's palsy 2004  . CAD (coronary artery disease)    a. MI in 2004 w/ PCI to LAD and mRCA; b. LHC 07/2008: patent LAD stent, 30% distal LAD stenosis, 25% proximal LCx stenosis, 20% mid LCx stenosis, 95% mid RCA stenosis s/p PCI/DES (Xience 3.5 x 28 mm), and 40% distal RCA stenosis  . DDD (degenerative disc disease) 10/02   has lumbar fusion  . HLD (hyperlipidemia)    mixed  . [place]  . Nephrolithiasis   . Skin cancer, basal cell     Past Surgical History:  Procedure Laterality Date  . AMPUTATION  1985   L fingers reattachement   . CARDIAC CATHETERIZATION     x2. 2004, 2010. 3 stents(total)  . CHOLECYSTECTOMY  3/03  . COLONOSCOPY WITH PROPOFOL N/A 06/13/2016   Procedure: COLONOSCOPY WITH PROPOFOL;  Surgeon: DLucilla Lame MD;  [place]  Service: Endoscopy;  Laterality: N/A;  . POLYPECTOMY  06/13/2016   Procedure: POLYPECTOMY;  Surgeon: DLucilla Lame MD;  [place]  Service: Endoscopy;;  . WRIST SURGERY  10/2011     Home Meds: Prior to  Admission medications   Medication Sig Start Date End Date Taking? Authorizing Provider  aspirin 325 MG EC tablet Take 325 mg by mouth daily.     Yes [provider]  bictegravir-emtricitabine-tenofovir AF (BIKTARVY) 50-200-25 MG TABS tablet Take 1 tablet by mouth daily. 04/24/17  Yes Tresa Garter, MD  Multiple Vitamin (MULTIVITAMIN) tablet Take 1 tablet by mouth daily.    [provider]  rosuvastatin (CRESTOR) 40 MG tablet Take 1 tablet (40 mg total) by mouth daily. 04/21/16 11/16/17  [name], MD    Inpatient Medications: Scheduled Meds:  Continuous Infusions:  PRN Meds:   Allergies:  No Known Allergies  Social History:   Social  History   Socioeconomic History  . Marital status: Single    Spouse name: Not on file  . Number of children: Not on file  . Years of education: Not on file  . Highest education level: Not on file  Occupational History  . Occupation: Orderly/CNA    Employer: [employer]: Full time  Social Needs  . Financial resource strain: Not on file  . Food insecurity:    Worry: Not on file    Inability: Not on file  . Transportation needs:    Medical: Not on file    Non-medical: Not on file  Tobacco Use  . Smoking status: Former Smoker    Last attempt to quit: 04/07/2010    Years since quitting: 7.6  . Smokeless tobacco: Never Used  Substance and Sexual Activity  . Alcohol use: No    Comment: occasional (1x/mo)  . Drug use: No  . Sexual activity: Not on file  Lifestyle  . Physical activity:    Days per week: Not on file    Minutes per session: Not on file  . Stress: Not on file  Relationships  . Social connections:    Talks on phone: Not on file    Gets together: Not on file    Attends religious service: Not on file    Active member of club or organization: Not on file    Attends meetings of clubs or organizations: Not on file    Relationship status: Not on file  . Intimate partner violence:    Fear of current or ex partner: Not on file    Emotionally abused: Not on file    Physically abused: Not on file    Forced sexual activity: Not on file  Other Topics Concern  . Not on file  Social History Narrative   Single - domestic partner.    Full time - orderly/ CNA   Regular exercise - ride, walk, run               Family History:   Family History  Problem Relation Age of Onset  . Heart disease Father   . Coronary artery disease Unknown        family hx    ROS:  Review of Systems  Constitutional: Positive for malaise/fatigue. Negative for chills, diaphoresis, fever and weight loss.  HENT: Negative for congestion.   Eyes: Negative for discharge and redness.    Respiratory: Negative for cough, hemoptysis, sputum production, shortness of breath and wheezing.   Cardiovascular: Positive for chest pain. Negative for palpitations, orthopnea, claudication, leg swelling and PND.  Gastrointestinal: Positive for nausea. Negative for abdominal pain, blood in stool, heartburn, melena and vomiting.  Genitourinary: Negative for hematuria.  Musculoskeletal: Negative for falls and myalgias.  Skin: Negative for  rash.  Neurological: Positive for weakness. Negative for dizziness, tingling, tremors, sensory change, speech change, focal weakness and loss of consciousness.  Endo/Heme/Allergies: Does not bruise/bleed easily.  Psychiatric/Behavioral: Negative for substance abuse. The patient is not nervous/anxious.   All other systems reviewed and are negative.     Physical Exam/Data:   Vitals:   11/16/17 1146  BP: 112/72  Pulse: (!) 56  Resp: 18  SpO2: 100%   No intake or output data in the 24 hours ending 11/16/17 1146 There were no vitals filed for this visit. There is no height or weight on file to calculate BMI.  Taken emergently to the cath lab  Physical Exam: General: Well developed, well nourished, in no acute distress. Head: Normocephalic, atraumatic, sclera non-icteric, no xanthomas, nares without discharge.  Neck: Negative for carotid bruits. JVD not elevated. Lungs: Clear bilaterally to auscultation without wheezes, rales, or rhonchi. Breathing is unlabored. Heart: RRR with S1 S2. No murmurs, rubs, or gallops appreciated. Abdomen: Soft, non-tender, non-distended with normoactive bowel sounds. No hepatomegaly. No rebound/guarding. No obvious abdominal masses. Msk:  Strength and tone appear normal for age. Extremities: No clubbing or cyanosis. No edema. Distal pedal pulses are 2+ and equal bilaterally. Neuro: Alert and oriented X 3. No facial asymmetry. No focal deficit. Moves all extremities spontaneously. Psych:  Responds to questions  appropriately with a normal affect.   EKG:  The EKG was personally reviewed and demonstrates: NSR, 67 bpm, 2-3 mm ST elevation in leads II, III, aVF, TWI aVL.  Telemetry:  Telemetry was personally reviewed and demonstrates: taken emergently to the cath lab  Weights: There were no vitals filed for this visit.  Taken emergently to the cath lab  Relevant CV Studies: LHC 07/2008:  Patent LAD stent, 30% distal LAD stenosis, 25% proximal LCx stenosis, 20% mid LCx stenosis, 95% mid RCA stenosis s/p PCI/DES (Xience 3.5 x 28 mm), and 40% distal RCA stenosis.  Laboratory Data:  Chemistry Recent Labs  Lab 11/16/17 1106  NA 141  K 4.8  CL 104  CO2 22  GLUCOSE 109*  BUN 20  CREATININE 1.88*  CALCIUM 9.4  GFRNONAA 38*  GFRAA 44*  ANIONGAP 15    Recent Labs  Lab 11/16/17 1106  PROT 7.6  ALBUMIN 4.6  AST 53*  ALT 22  ALKPHOS 72  BILITOT 1.0   Hematology Recent Labs  Lab 11/16/17 1109  WBC 7.2  RBC 4.95  HGB 16.3  HCT 47.8  MCV 96.7  MCH 33.0  MCHC 34.1  RDW 13.5  PLT 268   Cardiac EnzymesNo results for input(s): TROPONINI in the last 168 hours. No results for input(s): TROPIPOC in the last 168 hours.  BNPNo results for input(s): BNP, PROBNP in the last 168 hours.  DDimer No results for input(s): DDIMER in the last 168 hours.  Radiology/Studies:  No results found.  Assessment and Plan:   1. Inferior STEMI/CAD: -Taken emergently to the cath lab -Results/recommendations pending at this time -Trend troponin until peak -ASA -Heparin bolus in the ED -Echo -Beta blocker as vitals allow -Crestor -Check lipid and A1c -Cardiac rehab  2. AKI: -Gentle post-cath hydration -Monitor SCr while admitted and close follow up as an outpatient -Avoid ACEi/ARB at this time  3. HLD: -Check lipid panel as above -Crestor -Goal LDL <70  4. HIV: -Outpatient follow up -CD4 and viral load as above -Per IM   For questions or updates, please contact [place] Please  consult [email or web address] for contact info under Cardiology/STEMI.  Signed, Christell Faith, PA-C [facility] Pager: [phone] 11/16/2017, 11:46 AM

## 2017-11-16 NOTE — ED Provider Notes (Signed)
[facility] Emergency Department Provider Note   ____________________________________________   First MD Initiated Contact with Patient 11/16/17 1145     (approximate)  I have reviewed the triage vital signs and the nursing notes.   HISTORY  Chief Complaint I am having another heart attack   HPI Daniel Black is a 57 y.o. male reports that he is here because he is having another heart attack.  Patient reports he had a previous stent.  He is been having off-and-on chest pain for about a week or 2 now.  He began experiencing chest pain again today.  Went to urgent care was diagnosed as possibly having an acute inferior MI with STEMI.  EMS reports that he has had 324 mg of aspirin, 100 mcg of fentanyl, and his mental status and vital signs have been stable with some slight hypotension.  Patient reports moderate ongoing mid chest discomfort.    Past Medical History:  Diagnosis Date  . Asymptomatic HIV infection ([place])   . Bell's palsy 2004  . CAD (coronary artery disease)    a. MI in 2004 w/ PCI to LAD and mRCA; b. LHC 07/2008: patent LAD stent, 30% distal LAD stenosis, 25% proximal LCx stenosis, 20% mid LCx stenosis, 95% mid RCA stenosis s/p PCI/DES (Xience 3.5 x 28 mm), and 40% distal RCA stenosis  . DDD (degenerative disc disease) 10/02   has lumbar fusion  . HLD (hyperlipidemia)    mixed  . Myocardial infarction ([place]) 2004  . Nephrolithiasis   . Skin cancer, basal cell     Patient Active Problem List   Diagnosis Date Noted  . Acute ST elevation myocardial infarction (STEMI) of inferior wall ([place]) 11/16/2017  . CAD in native artery 11/16/2017  . AKI (acute kidney injury) ([place]) 11/16/2017  . Special screening for malignant neoplasms, colon   . Benign neoplasm of ascending colon   . Human immunodeficiency virus (HIV) disease ([place]) 11/25/2009  . Hyperlipidemia 11/23/2009  . Coronary atherosclerosis 11/23/2009  . Chest pain 11/23/2009     Past Surgical History:  Procedure Laterality Date  . AMPUTATION  1985   L fingers reattachement   . CARDIAC CATHETERIZATION     x2. 2004, 2010. 3 stents(total)  . CHOLECYSTECTOMY  3/03  . COLONOSCOPY WITH PROPOFOL N/A 06/13/2016   Procedure: COLONOSCOPY WITH PROPOFOL;  Surgeon: Lucilla Lame, MD;  Location: [place];  Service: Endoscopy;  Laterality: N/A;  . POLYPECTOMY  06/13/2016   Procedure: POLYPECTOMY;  Surgeon: Lucilla Lame, MD;  Location: [place];  Service: Endoscopy;;  . WRIST SURGERY  10/2011    Prior to Admission medications   Medication Sig Start Date End Date Taking? Authorizing Provider  aspirin 325 MG EC tablet Take 325 mg by mouth daily.     Yes [provider]  bictegravir-emtricitabine-tenofovir AF (BIKTARVY) 50-200-25 MG TABS tablet Take 1 tablet by mouth daily. 04/24/17  Yes Tresa Garter, MD  Multiple Vitamin (MULTIVITAMIN) tablet Take 1 tablet by mouth daily.   Yes [provider]  rosuvastatin (CRESTOR) 40 MG tablet Take 1 tablet (40 mg total) by mouth daily. Patient not taking: Reported on 11/16/2017 04/21/16 11/16/17  [name], MD    Allergies Patient has no known allergies.  Family History  Problem Relation Age of Onset  . Heart disease Father   . Coronary artery disease Unknown        family hx    Social History Social History   Tobacco Use  .  Smoking status: Former Smoker    Last attempt to quit: 04/07/2010    Years since quitting: 7.6  . Smokeless tobacco: Never Used  Substance Use Topics  . Alcohol use: No    Comment: occasional (1x/mo)  . Drug use: No    Review of Systems EM caveat: Limited due to high acuity and need for emergent cardiac catheterization   ____________________________________________   PHYSICAL EXAM:  VITAL SIGNS: ED Triage Vitals [11/16/17 1146]  Enc Vitals Group     BP 112/72     Pulse Rate (!) 56     Resp 18     Temp      Temp src      SpO2 100 %      Weight      Height      Head Circumference      Peak Flow      Pain Score      Pain Loc      Pain Edu?      Excl. in [place]?     Constitutional: Alert and oriented. Well appearing and in no acute distress. Eyes: Conjunctivae are normal. Head: Atraumatic. Nose: No congestion/rhinnorhea. Mouth/Throat: Mucous membranes are moist. Neck: No stridor.   Cardiovascular: Normal rate, regular rhythm. Grossly normal heart sounds.  Good peripheral circulation. Respiratory: Normal respiratory effort.  No retractions. Lungs CTAB. Neurologic:  Normal speech and language. No gross focal neurologic deficits are appreciated.  Skin:  Skin is warm, dry and intact. No rash noted. Psychiatric: Mood and affect are calm and pleasant.  ____________________________________________   LABS (all labs ordered are listed, but only abnormal results are displayed)  Labs Reviewed  BASIC METABOLIC PANEL - Abnormal; Notable for the following components:      Result Value   Glucose, Bld 111 (*)    BUN 21 (*)    Creatinine, Ser 1.57 (*)    GFR calc non Af Amer 48 (*)    GFR calc Af Amer 55 (*)    All other components within normal limits  CBC  PROTIME-INR  APTT  HEPARIN LEVEL (UNFRACTIONATED)   ____________________________________________  EKG  No EKG performed in the ED, I did review his EKG from epic urgent care which showed acute ST elevation MI inferiorly ____________________________________________  RADIOLOGY   ____________________________________________   PROCEDURES  Procedure(s) performed: None  Procedures  Critical Care performed: Yes, see critical care note(s)  CRITICAL CARE Performed by: Delman Kitten   Total critical care time: 15 minutes  Critical care time was exclusive of separately billable procedures and treating other patients.  Critical care was necessary to treat or prevent imminent or life-threatening deterioration.  Critical care was time spent personally by me on the  following activities: development of treatment plan with patient and/or surrogate as well as nursing, discussions with consultants, evaluation of patient's response to treatment, examination of patient, obtaining history from patient or surrogate, ordering and performing treatments and interventions, ordering and review of laboratory studies, ordering and review of radiographic studies, pulse oximetry and re-evaluation of patient's condition.  ____________________________________________   INITIAL IMPRESSION / ASSESSMENT AND PLAN / ED COURSE  Pertinent labs & imaging results that were available during my care of the patient were reviewed by me and considered in my medical decision making (see chart for details).  Chest pain.  EKG concerning for acute MI.  Patient arrives with clinical history that suggests acute ST elevation MI and correlates with his symptoms.  Seen by Dr. Fletcher Anon of cardiology  at the bedside simultaneously, patient was briefly assessed and felt stable for proceeding with cardiac catheterization and further work-up with cardiology.  Patient given heparin bolus as advised by cardiology.        ____________________________________________   FINAL CLINICAL IMPRESSION(S) / ED DIAGNOSES  Final diagnoses:  Acute ST elevation myocardial infarction (STEMI), unspecified artery ([place])      NEW MEDICATIONS STARTED DURING THIS VISIT:  Current Discharge Medication List       Note:  This document was prepared using Dragon voice recognition software and may include unintentional dictation errors.     Delman Kitten, MD 11/16/17 1231

## 2017-11-16 NOTE — Progress Notes (Signed)
*  PRELIMINARY RESULTS* Echocardiogram 2D Echocardiogram has been performed.  Sherrie Sport 11/16/2017, 4:44 PM

## 2017-11-16 NOTE — ED Notes (Signed)
Pt comes via [facility] from [facility] with c/io chest pain. Pt states it started about 30 minutes ago. Pt states he took 325 aspirin. EMS gave 100 fent and 8 of zofran. Pt is alert and answering questions at this time.

## 2017-11-16 NOTE — ED Triage Notes (Signed)
Patient states he was working out and developed chest pain, and shoulder pain as well as nausea.

## 2017-11-16 NOTE — Progress Notes (Signed)
ANTICOAGULATION CONSULT NOTE - Initial Consult  Pharmacy Consult for heparin Indication: chest pain/ACS  No Known Allergies  Patient Measurements: Height: 5\' 10"  (177.8 cm) Weight: 175 lb (79.4 kg) IBW/kg (Calculated) : 73 Heparin Dosing Weight: 79.4 kg  Vital Signs: Temp: 97.5 F (36.4 C) (08/12 1057) Temp Source: Oral (08/12 1057) BP: 112/72 (08/12 1146) Pulse Rate: 56 (08/12 1146)  Labs: Recent Labs    11/16/17 1106 11/16/17 1109  HGB  --  16.3  HCT  --  47.8  PLT  --  268  CREATININE 1.88*  --   TROPONINI 0.31*  --     Estimated Creatinine Clearance: 45.3 mL/min (A) (by C-G formula based on SCr of 1.88 mg/dL (H)).   Medical History: Past Medical History:  Diagnosis Date  . Asymptomatic HIV infection ([place])   . Bell's palsy 2004  . CAD (coronary artery disease)    a. MI in 2004 w/ PCI to LAD and mRCA; b. LHC 07/2008: patent LAD stent, 30% distal LAD stenosis, 25% proximal LCx stenosis, 20% mid LCx stenosis, 95% mid RCA stenosis s/p PCI/DES (Xience 3.5 x 28 mm), and 40% distal RCA stenosis  . DDD (degenerative disc disease) 10/02   has lumbar fusion  . HLD (hyperlipidemia)    mixed  . Myocardial infarction ([place]) 2004  . Nephrolithiasis   . Skin cancer, basal cell     Medications:  Infusions:  . heparin      Assessment: 44 yom cc CP with PMH  CAD s/p 2 MI and PCI, as well as HIV, Bell's palsy, DDD, HLD, nephrolithiasis, skin cancer. Initial troponin 0.31 in ED and EKG with NSR, ST elevation considered inferior injury or acute infarct. Pharmacy consulted to dose heparin for ACS. No PTA OAC noted and medication history is complete.    Goal of Therapy:  Heparin level 0.3-0.7 units/ml Monitor platelets by anticoagulation protocol: Yes   Plan:  Give 4000 units bolus x 1 Start heparin infusion at 950 units/hr Check anti-Xa level in 6 hours and daily while on heparin Continue to monitor H&H and platelets  Laural Benes, Pharm.D., BCPS Clinical  Pharmacist 11/16/2017,11:59 AM

## 2017-11-16 NOTE — ED Notes (Signed)
Pt transferred to CATH lab.

## 2017-11-17 ENCOUNTER — Telehealth: Payer: Self-pay | Admitting: Cardiovascular Disease

## 2017-11-17 DIAGNOSIS — N179 Acute kidney failure, unspecified: Secondary | ICD-10-CM

## 2017-11-17 DIAGNOSIS — I2119 ST elevation (STEMI) myocardial infarction involving other coronary artery of inferior wall: Principal | ICD-10-CM

## 2017-11-17 DIAGNOSIS — E785 Hyperlipidemia, unspecified: Secondary | ICD-10-CM

## 2017-11-17 DIAGNOSIS — B20 Human immunodeficiency virus [HIV] disease: Secondary | ICD-10-CM

## 2017-11-17 LAB — LIPID PANEL
CHOLESTEROL: 135 mg/dL (ref 0–200)
HDL: 42 mg/dL (ref >40)
LDL Cholesterol: 82 mg/dL (ref 0–99)
TRIGLYCERIDES: 54 mg/dL (ref <150)
Total CHOL/HDL Ratio: 3.2 RATIO
VLDL: 11 mg/dL (ref 0–40)

## 2017-11-17 LAB — TROPONIN I
TROPONIN I: 8.38 ng/mL — AB (ref <0.03)
Troponin I: 4.26 ng/mL (ref <0.03)

## 2017-11-17 LAB — CBC
HEMATOCRIT: 40.4 % (ref 40.0–52.0)
HEMOGLOBIN: 14 g/dL (ref 13.0–18.0)
MCH: 33.4 pg (ref 26.0–34.0)
MCHC: 34.6 g/dL (ref 32.0–36.0)
MCV: 96.7 fL (ref 80.0–100.0)
Platelets: 199 10*3/uL (ref 150–440)
RBC: 4.17 MIL/uL — AB (ref 4.40–5.90)
RDW: 13.9 % (ref 11.5–14.5)
WBC: 7.4 10*3/uL (ref 3.8–10.6)

## 2017-11-17 LAB — BASIC METABOLIC PANEL
ANION GAP: 3 — AB (ref 5–15)
BUN: 23 mg/dL — ABNORMAL HIGH (ref 6–20)
CALCIUM: 8.8 mg/dL — AB (ref 8.9–10.3)
CO2: 30 mmol/L (ref 22–32)
Chloride: 106 mmol/L (ref 98–111)
Creatinine, Ser: 1.7 mg/dL — ABNORMAL HIGH (ref 0.61–1.24)
GFR calc non Af Amer: 43 mL/min — ABNORMAL LOW (ref >60)
GFR, EST AFRICAN AMERICAN: 50 mL/min — AB (ref >60)
GLUCOSE: 92 mg/dL (ref 70–99)
POTASSIUM: 4 mmol/L (ref 3.5–5.1)
Sodium: 139 mmol/L (ref 135–145)

## 2017-11-17 MED ORDER — SODIUM CHLORIDE 0.9 % IV SOLN
INTRAVENOUS | Status: DC
  Administered 2017-11-17 – 2017-11-19 (×3): via INTRAVENOUS

## 2017-11-17 MED ORDER — EZETIMIBE 10 MG PO TABS
10.0000 mg | ORAL_TABLET | Freq: Every day | ORAL | Status: DC
  Administered 2017-11-17 – 2017-11-19 (×3): 10 mg via ORAL
  Filled 2017-11-17 (×3): qty 1

## 2017-11-17 NOTE — Progress Notes (Signed)
Progress Note  Patient Name: Daniel Black Date of Encounter: 11/17/2017  Primary Cardiologist: Rockey Situ  Subjective   Chest slightly sore from defibrillation just prior to obtaining arterial access (VF). No angina. Troponin has trended to 8.38 currently.  LDL 82. Post-cath HGB stable at 14.0. Scr 1.88-->1.57--1.70, K+ 4.0, Mg 2.2, A1c 5.3. Dopamine has been successfully weaned with BP in the 373S systolic. HR in the upper 50s to low 60s bpm (this is his baseline). Echo is pending.    Inpatient Medications    Scheduled Meds: . aspirin  81 mg Oral Daily  . bictegravir-emtricitabine-tenofovir AF  1 tablet Oral Daily  . multivitamin with minerals  1 tablet Oral q1800  . rosuvastatin  40 mg Oral Daily  . sodium chloride flush  3 mL Intravenous Q12H  . ticagrelor  90 mg Oral BID   Continuous Infusions: . sodium chloride     PRN Meds: sodium chloride, acetaminophen, ondansetron (ZOFRAN) IV, sodium chloride flush   Vital Signs    Vitals:   11/17/17 0400 11/17/17 0500 11/17/17 0600 11/17/17 0700  BP: 108/71  125/87   Pulse: (!) 45 (!) 49 (!) 54 61  Resp: _0 (!) 8  Temp: 97.8 F (36.6 C)     TempSrc: Oral     SpO2: 97% 97% 92% 96%  Weight:      Height:        Intake/Output Summary (Last 24 hours) at 11/17/2017 0813 Last data filed at 11/17/2017 0600 Gross per 24 hour  Intake 121.75 ml  Output 2750 ml  Net -2628.25 ml   Filed Weights   11/16/17 1152 11/16/17 1330  Weight: 79.4 kg 82.6 kg    Telemetry    NSR - Personally Reviewed  ECG    Sinus bradycardia, 52 bpm, improved inferior ST elevation, inferior Q waves - Personally Reviewed  Physical Exam   GEN: No acute distress.   Neck: No JVD. Cardiac: RRR, no murmurs, rubs, or gallops. Right radial cardiac cath site without active bleeding, bruising, swelling, erythema, warmth, or TTP. Radial pulse 2+. Respiratory: Clear to auscultation bilaterally.  GI: Soft, nontender, non-distended.   MS: No edema;  No deformity. Neuro:  Alert and oriented x 3; Nonfocal.  Psych: Normal affect.  Labs    Chemistry Recent Labs  Lab 11/16/17 1106 11/16/17 1144 11/17/17 0132  NA 141 140 139  K 4.8 3.7 4.0  CL 104 106 106  CO2 _1 GLUCOSE 109* 111* 92  BUN 20 21* 23*  CREATININE 1.88* 1.57* 1.70*  CALCIUM 9.4 9.4 8.8*  PROT 7.6  --   --   ALBUMIN 4.6  --   --   AST 53*  --   --   ALT 22  --   --   ALKPHOS 72  --   --   BILITOT 1.0  --   --   GFRNONAA 38* 48* 43*  GFRAA 44* 55* 50*  ANIONGAP 15 9 3*     Hematology Recent Labs  Lab 11/16/17 1109 11/16/17 1144 11/17/17 0132  WBC 7.2 6.4 7.4  RBC 4.95 4.53 4.17*  HGB 16.3 15.3 14.0  HCT 47.8 43.7 40.4  MCV 96.7 96.4 96.7  MCH 33.0 33.8 33.4  MCHC 34.1 35.1 34.6  RDW 13.5 13.7 13.9  PLT 268 260 199    Cardiac Enzymes Recent Labs  Lab 11/16/17 1106 11/16/17 1329 11/16/17 1916 11/17/17 0132  TROPONINI 0.31* 0.56* 5.67* 8.38*   No  results for input(s): TROPIPOC in the last 168 hours.   BNPNo results for input(s): BNP, PROBNP in the last 168 hours.   DDimer No results for input(s): DDIMER in the last 168 hours.   Radiology    No results found.  Cardiac Studies   LHC 11/16/2017: Coronary Findings   Diagnostic  Dominance: Right  Left Main  The vessel exhibits minimal luminal irregularities.  Left Anterior Descending  Prox LAD to Mid LAD lesion 10% stenosed  Prox LAD to Mid LAD lesion is 10% stenosed. The lesion was previously treated.  Mid LAD lesion 30% stenosed  Mid LAD lesion is 30% stenosed.  Left Circumflex  Prox Cx to Mid Cx lesion 95% stenosed  Prox Cx to Mid Cx lesion is 95% stenosed.  Right Coronary Artery  Prox RCA to Mid RCA lesion 20% stenosed  Prox RCA to Mid RCA lesion is 20% stenosed. The lesion was previously treatedover 2 years ago.  Mid RCA lesion 30% stenosed  Mid RCA lesion is 30% stenosed.  Mid RCA to Dist RCA lesion 30% stenosed  Mid RCA to Dist RCA lesion is 30% stenosed. The  lesion was previously treatedover 2 years ago.  Dist RCA lesion 85% stenosed  Dist RCA lesion is 85% stenosed. The lesion was not previously treated.  Right Posterior Descending Artery  Ost RPDA to RPDA lesion 95% stenosed  Ost RPDA to RPDA lesion is 95% stenosed.  Right Posterior Atrioventricular Branch  Post Atrio-1 lesion 95% stenosed  Post Atrio-1 lesion is 95% stenosed. Vessel is the culprit lesion. The lesion is type C.  Post Atrio-2 lesion 100% stenosed  Post Atrio-2 lesion is 100% stenosed.  Intervention   Dist RCA lesion  Stent  Lesion length: 11 mm. CATHETER LAUNCHER 6FR JR4 guide catheter was inserted. Lesion crossed with guidewire using a WIRE INTUITION PROPEL ST 180CM. Pre-stent angioplasty was performed using a BALLOON [place] RX 2.0X12. Maximum pressure: 10 atm. Inflation time: 20 sec. A drug-eluting stent was successfully placed using a STENT RESOLUTE ONYX 3.5X12. Maximum pressure: 14 atm. Inflation time: 20 sec. Stent overlaps previously placed stent. Post-stent angioplasty was not performed.  Post-Intervention Lesion Assessment  The intervention was successful. Pre-interventional TIMI flow is 3. Post-intervention TIMI flow is 3. No complications occurred at this lesion.  There is a 0% residual stenosis post intervention.  Post Atrio-1 lesion  Stent  Lesion length: 13 mm. CATHETER LAUNCHER 6FR JR4 guide catheter was inserted. Lesion crossed with guidewire using a WIRE INTUITION PROPEL ST 180CM. Pre-stent angioplasty was performed using a BALLOON [place] RX 2.0X12. Maximum pressure: 8 atm. Inflation time: 30 sec. A drug-eluting stent was successfully placed using a STENT RESOLUTE ONYX 2.5X15. Maximum pressure: 16 atm. Inflation time: 20 sec. Post-stent angioplasty was not performed.  Post-Intervention Lesion Assessment  The intervention was successful. Pre-interventional TIMI flow is 3. Post-intervention TIMI flow is 3. No complications occurred at this lesion.  There is a  0% residual stenosis post intervention.  Wall Motion   Resting               Left Heart   Left Ventricle The left ventricular size is normal. The left ventricular systolic function is normal. LV end diastolic pressure is moderately elevated. The left ventricular ejection fraction is 55-65% by visual estimate. No regional wall motion abnormalities.  Coronary Diagrams   Diagnostic Diagram       Post-Intervention Diagram        Conclusion     Prox RCA to  Mid RCA lesion is 20% stenosed.  Mid RCA to Dist RCA lesion is 30% stenosed.  Mid RCA lesion is 30% stenosed.  Dist RCA lesion is 85% stenosed.  Post intervention, there is a 0% residual stenosis.  A drug-eluting stent was successfully placed using a STENT RESOLUTE ONYX 3.5X12.  Ost RPDA to RPDA lesion is 95% stenosed.  A drug-eluting stent was successfully placed using a STENT RESOLUTE ONYX 2.5X15.  Post Atrio-1 lesion is 95% stenosed.  Post intervention, there is a 0% residual stenosis.  Post Atrio-2 lesion is 100% stenosed.  Prox Cx to Mid Cx lesion is 95% stenosed.  Prox LAD to Mid LAD lesion is 10% stenosed.  Mid LAD lesion is 30% stenosed.  The left ventricular systolic function is normal.  LV end diastolic pressure is moderately elevated.  The left ventricular ejection fraction is 55-65% by visual estimate.   1.  Severe underlying three-vessel coronary artery disease with patent stents in the LAD and right coronary artery.  Severe stenosis in the distal RCA and the posterior AV groove artery likely the culprit for inferior ST elevation MI.  There is evidence of embolization into distal PL 3.   There is also severe stenosis in the mid left circumflex. 2.  Ventricular fibrillation arrest shortly after obtaining arterial access was treated successfully with defibrillation. 3.  Normal LV systolic function and moderately elevated left ventricular end-diastolic pressure. 4.  Successful angioplasty and  drug-eluting stent placement to the distal RCA as well as posterior AV groove segment.  Recommendations:   Recommend uninterrupted dual antiplatelet therapy with Aspirin 42m daily and Ticagrelor 947mtwice daily for a minimum of 12 months (ACS - Class I recommendation).  Consider long-term dual antiplatelet therapy given multiple stents. Continue aggressive treatment of risk factors.  Recommend staged left circumflex PCI before hospital discharge likely on Wednesday.     Echo pending  Patient Profile     [age].o. male with history of CAD with an MI in 2004 s/p PCI to the LAD and RCA at that time s/p repeat PCI to the RCA in 2010, HIV, HLD with noncompliance in statin therapy, Bell's palsy, DDD, kidney stones, and remote tobacco abuse who is being seen today for the evaluation of inferior STEMI.  Assessment & Plan    1. Inferior STEMI/CAD of the native coronary arteries/cardiogenic shock: -Dopamine has been successfully weaned off -No angina -Continue to cycle troponin until peak and down trending -Continue DAPT with ASA 81 mg daily and Brilinta 90 mg bid without interruption for at leas the next 12 months -Plan for staged PCI of the LCx prior to discharge, possibly on 11/18/2017, if renal function allows -Gentle IV hydration as below -Not on beta blocker given cardiogenic shock as above, start as BP/HR allows -Echo pending -Cardiac rehab  2. AKI: -Gentle hydration with IV fluids this morning -Will need close monitoring -May impact timing of his stage procedure as above  3. HLD: -Goal LDL <70 -LDL this admission of 82 -Continue Crestor 40 mg daily -Add Zetia 10 mg daily -Recheck lipid and liver function as an outpatient in ~ 8 weeks, if LDL remains above goal at that time consider PCSK9 inhibitor   4. HIV: -Outpatient follow up -CD4 > 600, viral load < 20 -Per IM   For questions or updates, please contact [facility] consult [email or web address] for contact info  under Cardiology/STEMI.    Signed, RyChristell FaithPA-C CHLog Cabinager: ([phone]/13/2019, 8:13 AM

## 2017-11-17 NOTE — Progress Notes (Signed)
Handoff report given to Lavella Lemons, RN on Tele 2A.

## 2017-11-17 NOTE — Progress Notes (Signed)
[facility]                                                                                                                                                                                  Patient Demographics   Daniel Black, is a 57 y.o. male, DOB - 03/31/1961, [place] date - 11/16/2017   Admitting Physician Henreitta Leber, MD  Outpatient Primary MD for the patient is Patient, No Pcp Per   LOS - 1  Subjective: Patient admitted with chest pain had 2 stents placed.    Review of Systems:   CONSTITUTIONAL: No documented fever. No fatigue, weakness. No weight gain, no weight loss.  EYES: No blurry or double vision.  ENT: No tinnitus. No postnasal drip. No redness of the oropharynx.  RESPIRATORY: No cough, no wheeze, no hemoptysis. No dyspnea.  CARDIOVASCULAR: No chest pain. No orthopnea. No palpitations. No syncope.  GASTROINTESTINAL: No nausea, no vomiting or diarrhea. No abdominal pain. No melena or hematochezia.  GENITOURINARY: No dysuria or hematuria.  ENDOCRINE: No polyuria or nocturia. No heat or cold intolerance.  HEMATOLOGY: No anemia. No bruising. No bleeding.  INTEGUMENTARY: No rashes. No lesions.  MUSCULOSKELETAL: No arthritis. No swelling. No gout.  NEUROLOGIC: No numbness, tingling, or ataxia. No seizure-type activity.  PSYCHIATRIC: No anxiety. No insomnia. No ADD.    Vitals:   Vitals:   11/17/17 1345 11/17/17 1400 11/17/17 1415 11/17/17 1430  BP:   102/67   Pulse: (!) 57 60 (!) 55 (!) 59  Resp: 16 12  11   Temp:      TempSrc:      SpO2: 95% 96% 96% 97%  Weight:      Height:        Wt Readings from Last 3 Encounters:  11/16/17 82.6 kg  11/16/17 79.4 kg  04/20/17 84.4 kg     Intake/Output Summary (Last 24 hours) at 11/17/2017 1440 Last data filed at 11/17/2017 1359 Gross per 24 hour  Intake 601.75 ml  Output 3350 ml  Net -2748.25 ml    Physical Exam:   GENERAL: Pleasant-appearing in no  apparent distress.  HEAD, EYES, EARS, NOSE AND THROAT: Atraumatic, normocephalic. Extraocular muscles are intact. Pupils equal and reactive to light. Sclerae anicteric. No conjunctival injection. No oro-pharyngeal erythema.  NECK: Supple. There is no jugular venous distention. No bruits, no lymphadenopathy, no thyromegaly.  HEART: Regular rate and rhythm,. No murmurs, no rubs, no clicks.  LUNGS: Clear to auscultation bilaterally. No rales or rhonchi. No wheezes.  ABDOMEN: Soft, flat, nontender, nondistended. Has good bowel sounds. No hepatosplenomegaly appreciated.  EXTREMITIES: No evidence of any cyanosis, clubbing, or peripheral  edema.  +2 pedal and radial pulses bilaterally.  NEUROLOGIC: The patient is alert, awake, and oriented x3 with no focal motor or sensory deficits appreciated bilaterally.  SKIN: Moist and warm with no rashes appreciated.  Psych: Not anxious, depressed LN: No inguinal LN enlargement    Antibiotics   Anti-infectives (From admission, onward)   Start     Dose/Rate Route Frequency Ordered Stop   11/17/17 1000  bictegravir-emtricitabine-tenofovir AF (BIKTARVY) 50-200-25 MG per tablet 1 tablet     1 tablet Oral Daily 11/16/17 1314        Medications   Scheduled Meds: . aspirin  81 mg Oral Daily  . bictegravir-emtricitabine-tenofovir AF  1 tablet Oral Daily  . ezetimibe  10 mg Oral Daily  . multivitamin with minerals  1 tablet Oral q1800  . rosuvastatin  40 mg Oral Daily  . sodium chloride flush  3 mL Intravenous Q12H  . ticagrelor  90 mg Oral BID   Continuous Infusions: . sodium chloride    . sodium chloride 75 mL/hr at 11/17/17 0842   PRN Meds:.sodium chloride, acetaminophen, ondansetron (ZOFRAN) IV, sodium chloride flush   Data Review:   Micro Results Recent Results (from the past 240 hour(s))  MRSA PCR Screening     Status: None   Collection Time: 11/16/17  1:31 PM  Result Value Ref Range Status   MRSA by PCR NEGATIVE NEGATIVE Final    Comment:         The GeneXpert MRSA Assay (FDA approved for NASAL specimens only), is one component of a comprehensive MRSA colonization surveillance program. It is not intended to diagnose MRSA infection nor to guide or monitor treatment for MRSA infections. Performed at [facility], [address] [place], [place] [address]     Radiology Reports No results found.   CBC Recent Labs  Lab 11/16/17 1109 11/16/17 1144 11/17/17 0132  WBC 7.2 6.4 7.4  HGB 16.3 15.3 14.0  HCT 47.8 43.7 40.4  PLT 268 260 199  MCV 96.7 96.4 96.7  MCH 33.0 33.8 33.4  MCHC 34.1 35.1 34.6  RDW 13.5 13.7 13.9  LYMPHSABS 2.5  --   --   MONOABS 0.5  --   --   EOSABS 0.2  --   --   BASOSABS 0.1  --   --     Chemistries  Recent Labs  Lab 11/16/17 1106 11/16/17 1144 11/17/17 0132  NA 141 140 139  K 4.8 3.7 4.0  CL 104 106 106  CO2 22 25 30   GLUCOSE 109* 111* 92  BUN 20 21* 23*  CREATININE 1.88* 1.57* 1.70*  CALCIUM 9.4 9.4 8.8*  MG  --  2.2  --   AST 53*  --   --   ALT 22  --   --   ALKPHOS 72  --   --   BILITOT 1.0  --   --    ------------------------------------------------------------------------------------------------------------------ estimated creatinine clearance is 50.1 mL/min (A) (by C-G formula based on SCr of 1.7 mg/dL (H)). ------------------------------------------------------------------------------------------------------------------ Recent Labs    11/16/17 1144  HGBA1C 5.3   ------------------------------------------------------------------------------------------------------------------ Recent Labs    11/17/17 0132  CHOL 135  HDL 42  LDLCALC 82  TRIG 54  CHOLHDL 3.2   ------------------------------------------------------------------------------------------------------------------ No results for input(s): TSH, T4TOTAL, T3FREE, THYROIDAB in the last 72 hours.  Invalid input(s):  FREET3 ------------------------------------------------------------------------------------------------------------------ No results for input(s): VITAMINB12, FOLATE, FERRITIN, TIBC, IRON, RETICCTPCT in the last 72 hours.  Coagulation profile Recent Labs  Lab  11/16/17 1144  INR 0.90    No results for input(s): DDIMER in the last 72 hours.  Cardiac Enzymes Recent Labs  Lab 11/16/17 1916 11/17/17 0132 11/17/17 0924  TROPONINI 5.67* 8.38* 4.26*   ------------------------------------------------------------------------------------------------------------------ Invalid input(s): POCBNP    Assessment & Plan   57 year old male with past medical history of HIV, hyperlipidemia, coronary disease status post previous stent, who presents to the hospital due to chest pain noted to have an ST elevation MI.  1.  ST elevation MI- status post 2 stents, other stent possible tomorrow based on patient's renal function in the circumflex artery -Continue aspirin, Brilinta, Crestor.  Cannot tolerate beta-blocker/ACE inhibitor given his relative hypotension presently. -Appreciate cardiology consult,   2.  Ventricular tachycardia related to acute MI continue to monitor  3.    Acute renal failure IV fluids to renal function  4. History of HIV-continue HAART.  5.  Hyperlipidemia-continue Crestor.        Code Status Orders  (From admission, onward)         Start     Ordered   11/16/17 1315  Full code  Continuous     11/16/17 1314        Code Status History    This patient has a current code status but no historical code status.           Consults 97min  DVT Prophylaxis heparin  Lab Results  Component Value Date   PLT 199 11/17/2017     Time Spent in minutes  30min  Greater than 50% of time spent in care coordination and counseling patient regarding the condition and plan of care.   Dustin Flock M.D on 11/17/2017 at 2:40 PM  Between 7am to 6pm - Pager -  [phone]  After 6pm go to [email or web address] - Proofreader  [employer]   Office  [phone]

## 2017-11-17 NOTE — Progress Notes (Signed)
Pt transferred from ICU to room 239 via wheelchair.  IVF infusing well rt ac.  No distress on ra.  Cardiac monitor placed on pt and verified, pt denies chest pain at this time.  SL lt ac flushes well.  Lungs clear bil.  Rt radial cath site within normal limits, dressing dry and intact.  Oriented to room and surroundings, CB in reach, SR up x 2.

## 2017-11-17 NOTE — Care Management Note (Signed)
Case Management Note  Patient Details  Name: Daniel Black MRN: [id number] Date of Birth: [birth year]  Subjective/Objective:                  RNCM met with patient and significant other to discuss transition of care. Patient is independent at baseline and still works/drives.  He uses [employer].  He is new to [employer] and voucher delivered to patient. His PCP is with [facility] NP) and follow up appointment has been arranged with her partner Dr. Ladoris Gene for 11/24/17 at 820AM; Dr. Rockey Situ on 8/19 at 11AM.Patient agrees.   Action/Plan:  RNCM to follow.   Expected Discharge Date:  11/20/17               Expected Discharge Plan:     In-House Referral:  PCP / [facility]  Discharge planning Services  CM Consult  Post Acute Care Choice:    Choice offered to:  Patient, Spouse  DME Arranged:    DME Agency:     HH Arranged:    [place] Agency:     Status of Service:     If discussed at [employer] of Stay Meetings, dates discussed:    Additional Comments:  Marshell Garfinkel, RN 11/17/2017, 11:16 AM

## 2017-11-17 NOTE — Telephone Encounter (Signed)
Received records request, [employer] forwarded to [facility] for processing.

## 2017-11-18 ENCOUNTER — Encounter
Admission: EM | Disposition: A | Discharge status: Home / Self Care | Payer: Self-pay | Source: Home / Self Care | Attending: Internal Medicine

## 2017-11-18 DIAGNOSIS — E782 Mixed hyperlipidemia: Secondary | ICD-10-CM

## 2017-11-18 LAB — BASIC METABOLIC PANEL
ANION GAP: 6 (ref 5–15)
BUN: 20 mg/dL (ref 6–20)
CO2: 27 mmol/L (ref 22–32)
Calcium: 8.9 mg/dL (ref 8.9–10.3)
Chloride: 109 mmol/L (ref 98–111)
Creatinine, Ser: 1.39 mg/dL — ABNORMAL HIGH (ref 0.61–1.24)
GFR calc Af Amer: >60 mL/min (ref >60)
GFR, EST NON AFRICAN AMERICAN: 55 mL/min — AB (ref >60)
Glucose, Bld: 98 mg/dL (ref 70–99)
Potassium: 3.7 mmol/L (ref 3.5–5.1)
SODIUM: 142 mmol/L (ref 135–145)

## 2017-11-18 LAB — CBC
HCT: 43.7 % (ref 40.0–52.0)
HEMOGLOBIN: 15.1 g/dL (ref 13.0–18.0)
MCH: 33.4 pg (ref 26.0–34.0)
MCHC: 34.6 g/dL (ref 32.0–36.0)
MCV: 96.6 fL (ref 80.0–100.0)
Platelets: 203 10*3/uL (ref 150–440)
RBC: 4.52 MIL/uL (ref 4.40–5.90)
RDW: 13.5 % (ref 11.5–14.5)
WBC: 5.9 10*3/uL (ref 3.8–10.6)

## 2017-11-18 SURGERY — LEFT HEART CATH AND CORONARY ANGIOGRAPHY
Anesthesia: Moderate Sedation

## 2017-11-18 MED ORDER — SODIUM CHLORIDE 0.9 % IV SOLN: 250.0000 mL | INTRAVENOUS | Status: DC | PRN

## 2017-11-18 MED ORDER — SODIUM CHLORIDE 0.9% FLUSH: 3.0000 mL | Freq: Two times a day (BID) | INTRAVENOUS | Status: DC

## 2017-11-18 MED ORDER — SODIUM CHLORIDE 0.9% FLUSH
3.0000 mL | Freq: Two times a day (BID) | INTRAVENOUS | Status: DC
  Administered 2017-11-18: 3 mL via INTRAVENOUS

## 2017-11-18 MED ORDER — SODIUM CHLORIDE 0.9 % WEIGHT BASED INFUSION: 1.0000 mL/kg/h | INTRAVENOUS | Status: DC

## 2017-11-18 MED ORDER — SODIUM CHLORIDE 0.9 % WEIGHT BASED INFUSION: 3.0000 mL/kg/h | INTRAVENOUS | Status: DC

## 2017-11-18 MED ORDER — SODIUM CHLORIDE 0.9 % WEIGHT BASED INFUSION
1.0000 mL/kg/h | INTRAVENOUS | Status: DC
  Administered 2017-11-19: 1 mL/kg/h via INTRAVENOUS

## 2017-11-18 MED ORDER — ADULT MULTIVITAMIN W/MINERALS CH: 1.0000 | ORAL_TABLET | Freq: Every day | ORAL | Status: DC

## 2017-11-18 MED ORDER — SODIUM CHLORIDE 0.9% FLUSH: 3.0000 mL | INTRAVENOUS | Status: DC | PRN

## 2017-11-18 NOTE — Progress Notes (Signed)
Patient alert and oriented, vss, no complaints of pain.   Signed consent for cath.  Has been NPO since midnight.

## 2017-11-18 NOTE — Plan of Care (Signed)
  Problem: Activity: Goal: Risk for activity intolerance will decrease Outcome: Progressing   Problem: Nutrition: Goal: Adequate nutrition will be maintained Outcome: Progressing   Problem: Coping: Goal: Level of anxiety will decrease Outcome: Progressing   Problem: Elimination: Goal: Will not experience complications related to urinary retention Outcome: Progressing   Problem: Pain Managment: Goal: General experience of comfort will improve Outcome: Progressing   Problem: Safety: Goal: Ability to remain free from injury will improve Outcome: Progressing   Problem: Skin Integrity: Goal: Risk for impaired skin integrity will decrease Outcome: Progressing   Problem: Cardiovascular: Goal: Vascular access site(s) Level 0-1 will be maintained Outcome: Progressing   Problem: Education: Goal: Knowledge of General Education information will improve Description Including pain rating scale, medication(s)/side effects and non-pharmacologic comfort measures Outcome: Completed/Met

## 2017-11-18 NOTE — Progress Notes (Deleted)
Care link here to transport patient to [facility] in [place], Patient being transferred for PCI by Dr. Fletcher Anon. Report called to nurse on 6 central, room 6. Phone number [phone]. Patient in NSR, denies chest pain, on RA (lungs clear). Transferred on cardiac monitor with NS infusing.Wife present.

## 2017-11-18 NOTE — Progress Notes (Signed)
[place] at [facility]                                                                                                                                                                                  Patient Demographics   Daniel Black, is a 57 y.o. male, DOB - April 15, 1960, [place] date - 11/16/2017   Admitting Physician Henreitta Leber, MD  Outpatient Primary MD for the patient is Patient, No Pcp Per   LOS - 2  Subjective: Denies any complaints overnight.  Plan for cath later today    Review of Systems:   CONSTITUTIONAL: No documented fever. No fatigue, weakness. No weight gain, no weight loss.  EYES: No blurry or double vision.  ENT: No tinnitus. No postnasal drip. No redness of the oropharynx.  RESPIRATORY: No cough, no wheeze, no hemoptysis. No dyspnea.  CARDIOVASCULAR: No chest pain. No orthopnea. No palpitations. No syncope.  GASTROINTESTINAL: No nausea, no vomiting or diarrhea. No abdominal pain. No melena or hematochezia.  GENITOURINARY: No dysuria or hematuria.  ENDOCRINE: No polyuria or nocturia. No heat or cold intolerance.  HEMATOLOGY: No anemia. No bruising. No bleeding.  INTEGUMENTARY: No rashes. No lesions.  MUSCULOSKELETAL: No arthritis. No swelling. No gout.  NEUROLOGIC: No numbness, tingling, or ataxia. No seizure-type activity.  PSYCHIATRIC: No anxiety. No insomnia. No ADD.    Vitals:   Vitals:   11/17/17 1921 11/18/17 0343 11/18/17 0733 11/18/17 0758  BP: 106/74 102/69  100/63  Pulse: (!) 54 (!) 53  (!) 50  Resp: 18 18    Temp: 97.8 F (36.6 C) 98.4 F (36.9 C)  97.6 F (36.4 C)  TempSrc: Oral   Oral  SpO2: 100% 96%  100%  Weight:   78.9 kg   Height:        Wt Readings from Last 3 Encounters:  11/18/17 78.9 kg  11/16/17 79.4 kg  04/20/17 84.4 kg     Intake/Output Summary (Last 24 hours) at 11/18/2017 1449 Last data filed at 11/18/2017 0640 Gross per 24 hour  Intake 1890.5 ml  Output 1 ml  Net  1889.5 ml    Physical Exam:   GENERAL: Pleasant-appearing in no apparent distress.  HEAD, EYES, EARS, NOSE AND THROAT: Atraumatic, normocephalic. Extraocular muscles are intact. Pupils equal and reactive to light. Sclerae anicteric. No conjunctival injection. No oro-pharyngeal erythema.  NECK: Supple. There is no jugular venous distention. No bruits, no lymphadenopathy, no thyromegaly.  HEART: Regular rate and rhythm,. No murmurs, no rubs, no clicks.  LUNGS: Clear to auscultation bilaterally. No rales or rhonchi. No wheezes.  ABDOMEN: Soft, flat, nontender, nondistended. Has good bowel sounds. No hepatosplenomegaly  appreciated.  EXTREMITIES: No evidence of any cyanosis, clubbing, or peripheral edema.  +2 pedal and radial pulses bilaterally.  NEUROLOGIC: The patient is alert, awake, and oriented x3 with no focal motor or sensory deficits appreciated bilaterally.  SKIN: Moist and warm with no rashes appreciated.  Psych: Not anxious, depressed LN: No inguinal LN enlargement    Antibiotics   Anti-infectives (From admission, onward)   Start     Dose/Rate Route Frequency Ordered Stop   11/17/17 1000  bictegravir-emtricitabine-tenofovir AF (BIKTARVY) 50-200-25 MG per tablet 1 tablet     1 tablet Oral Daily 11/16/17 1314        Medications   Scheduled Meds: . aspirin  81 mg Oral Daily  . bictegravir-emtricitabine-tenofovir AF  1 tablet Oral Daily  . ezetimibe  10 mg Oral Daily  . [START ON 11/19/2017] multivitamin with minerals  1 tablet Oral Daily  . rosuvastatin  40 mg Oral Daily  . sodium chloride flush  3 mL Intravenous Q12H  . sodium chloride flush  3 mL Intravenous Q12H  . sodium chloride flush  3 mL Intravenous Q12H  . ticagrelor  90 mg Oral BID   Continuous Infusions: . sodium chloride    . sodium chloride 75 mL/hr at 11/17/17 2158  . sodium chloride    . sodium chloride    . [START ON 11/19/2017] sodium chloride Stopped (11/18/17 1108)   Followed by  . [START ON  11/19/2017] sodium chloride Stopped (11/18/17 1117)  . [START ON 11/19/2017] sodium chloride     Followed by  . [START ON 11/19/2017] sodium chloride     PRN Meds:.sodium chloride, sodium chloride, sodium chloride, acetaminophen, ondansetron (ZOFRAN) IV, sodium chloride flush, sodium chloride flush, sodium chloride flush   Data Review:   Micro Results Recent Results (from the past 240 hour(s))  MRSA PCR Screening     Status: None   Collection Time: 11/16/17  1:31 PM  Result Value Ref Range Status   MRSA by PCR NEGATIVE NEGATIVE Final    Comment:        The GeneXpert MRSA Assay (FDA approved for NASAL specimens only), is one component of a comprehensive MRSA colonization surveillance program. It is not intended to diagnose MRSA infection nor to guide or monitor treatment for MRSA infections. Performed at [facility], [address] [place], [place] [address]     Radiology Reports No results found.   CBC Recent Labs  Lab 11/16/17 1109 11/16/17 1144 11/17/17 0132 11/18/17 0520  WBC 7.2 6.4 7.4 5.9  HGB 16.3 15.3 14.0 15.1  HCT 47.8 43.7 40.4 43.7  PLT 268 260 199 203  MCV 96.7 96.4 96.7 96.6  MCH 33.0 33.8 33.4 33.4  MCHC 34.1 35.1 34.6 34.6  RDW 13.5 13.7 13.9 13.5  LYMPHSABS 2.5  --   --   --   MONOABS 0.5  --   --   --   EOSABS 0.2  --   --   --   BASOSABS 0.1  --   --   --     Chemistries  Recent Labs  Lab 11/16/17 1106 11/16/17 1144 11/17/17 0132 11/18/17 0520  NA 141 140 139 142  K 4.8 3.7 4.0 3.7  CL 104 106 106 109  CO2 22 25 30 27   GLUCOSE 109* 111* 92 98  BUN 20 21* 23* 20  CREATININE 1.88* 1.57* 1.70* 1.39*  CALCIUM 9.4 9.4 8.8* 8.9  MG  --  2.2  --   --  AST 53*  --   --   --   ALT 22  --   --   --   ALKPHOS 72  --   --   --   BILITOT 1.0  --   --   --    ------------------------------------------------------------------------------------------------------------------ estimated creatinine clearance is 61.3 mL/min (A) (by  C-G formula based on SCr of 1.39 mg/dL (H)). ------------------------------------------------------------------------------------------------------------------ Recent Labs    11/16/17 1144  HGBA1C 5.3   ------------------------------------------------------------------------------------------------------------------ Recent Labs    11/17/17 0132  CHOL 135  HDL 42  LDLCALC 82  TRIG 54  CHOLHDL 3.2   ------------------------------------------------------------------------------------------------------------------ No results for input(s): TSH, T4TOTAL, T3FREE, THYROIDAB in the last 72 hours.  Invalid input(s): FREET3 ------------------------------------------------------------------------------------------------------------------ No results for input(s): VITAMINB12, FOLATE, FERRITIN, TIBC, IRON, RETICCTPCT in the last 72 hours.  Coagulation profile Recent Labs  Lab 11/16/17 1144  INR 0.90    No results for input(s): DDIMER in the last 72 hours.  Cardiac Enzymes Recent Labs  Lab 11/16/17 1916 11/17/17 0132 11/17/17 0924  TROPONINI 5.67* 8.38* 4.26*   ------------------------------------------------------------------------------------------------------------------ Invalid input(s): POCBNP    Assessment & Plan   57 year old male with past medical history of HIV, hyperlipidemia, coronary disease status post previous stent, who presents to the hospital due to chest pain noted to have an ST elevation MI.  1.  ST elevation MI- status post 2 stents, other stent possible today to the circumflex  -Continue aspirin, Brilinta, Crestor.  Cannot tolerate beta-blocker/ACE inhibitor given his relative hypotension presently. -Appreciate cardiology consult,   2.  Ventricular tachycardia related to acute MI continue to monitor  3.    Acute renal failure improved with IV fluid  4. History of HIV-continue HAART.  5.  Hyperlipidemia-continue Crestor.  Discharge when stable per  cardiology      Code Status Orders  (From admission, onward)         Start     Ordered   11/16/17 1315  Full code  Continuous     11/16/17 1314        Code Status History    This patient has a current code status but no historical code status.           Consults cardiology DVT Prophylaxis heparin  Lab Results  Component Value Date   PLT 203 11/18/2017     Time Spent in minutes  58min  Greater than 50% of time spent in care coordination and counseling patient regarding the condition and plan of care.   Dustin Flock M.D on 11/18/2017 at 2:49 PM  Between 7am to 6pm - Pager - [phone]  After 6pm go to [email or web address] - Proofreader  [employer]   Office  [phone]

## 2017-11-18 NOTE — Progress Notes (Addendum)
Progress Note  Patient Name: Daniel Black Date of Encounter: 11/18/2017  Primary Cardiologist: Rockey Situ  Subjective   Transferred out of the ICU on 11/17/2017. No further chest pain. No SOB. Renal function has improved to 1.39 with IV fluids. He is for staged LHC today. BP well controlled. Heart rate remains in the mid to upper 50s bpm (baseline for him).   Inpatient Medications    Scheduled Meds: . aspirin  81 mg Oral Daily  . bictegravir-emtricitabine-tenofovir AF  1 tablet Oral Daily  . ezetimibe  10 mg Oral Daily  . multivitamin with minerals  1 tablet Oral q1800  . rosuvastatin  40 mg Oral Daily  . sodium chloride flush  3 mL Intravenous Q12H  . ticagrelor  90 mg Oral BID   Continuous Infusions: . sodium chloride    . sodium chloride 75 mL/hr at 11/17/17 2158   PRN Meds: sodium chloride, acetaminophen, ondansetron (ZOFRAN) IV, sodium chloride flush   Vital Signs    Vitals:   11/17/17 1500 11/17/17 1537 11/17/17 1921 11/18/17 0343  BP: 113/78 128/80 106/74 102/69  Pulse: 67 (!) 52 (!) 54 (!) 53  Resp: 15 16 18 18   Temp:  97.9 F (36.6 C) 97.8 F (36.6 C) 98.4 F (36.9 C)  TempSrc:  Oral Oral   SpO2: 97% 100% 100% 96%  Weight:      Height:        Intake/Output Summary (Last 24 hours) at 11/18/2017 0724 Last data filed at 11/18/2017 0640 Gross per 24 hour  Intake 2370.5 ml  Output 601 ml  Net 1769.5 ml   Filed Weights   11/16/17 1152 11/16/17 1330  Weight: 79.4 kg 82.6 kg    Telemetry    Sinus bradycardia, 50s bpm - Personally Reviewed  ECG    n/a - Personally Reviewed  Physical Exam   GEN: No acute distress.   Neck: No JVD. Cardiac: Bradycardic, no murmurs, rubs, or gallops.  Respiratory: Clear to auscultation bilaterally.  GI: Soft, nontender, non-distended.   MS: No edema; No deformity. Neuro:  Alert and oriented x 3; Nonfocal.  Psych: Normal affect.  Labs    Chemistry Recent Labs  Lab 11/16/17 1106 11/16/17 1144  11/17/17 0132 11/18/17 0520  NA 141 140 139 142  K 4.8 3.7 4.0 3.7  CL 104 106 106 109  CO2 22 25 30 27   GLUCOSE 109* 111* 92 98  BUN 20 21* 23* 20  CREATININE 1.88* 1.57* 1.70* 1.39*  CALCIUM 9.4 9.4 8.8* 8.9  PROT 7.6  --   --   --   ALBUMIN 4.6  --   --   --   AST 53*  --   --   --   ALT 22  --   --   --   ALKPHOS 72  --   --   --   BILITOT 1.0  --   --   --   GFRNONAA 38* 48* 43* 55*  GFRAA 44* 55* 50* >60  ANIONGAP 15 9 3* 6     Hematology Recent Labs  Lab 11/16/17 1144 11/17/17 0132 11/18/17 0520  WBC 6.4 7.4 5.9  RBC 4.53 4.17* 4.52  HGB 15.3 14.0 15.1  HCT 43.7 40.4 43.7  MCV 96.4 96.7 96.6  MCH 33.8 33.4 33.4  MCHC 35.1 34.6 34.6  RDW 13.7 13.9 13.5  PLT 260 199 203    Cardiac Enzymes Recent Labs  Lab 11/16/17 1329 11/16/17 1916 11/17/17 0132 11/17/17  0924  TROPONINI 0.56* 5.67* 8.38* 4.26*   No results for input(s): TROPIPOC in the last 168 hours.   BNPNo results for input(s): BNP, PROBNP in the last 168 hours.   DDimer No results for input(s): DDIMER in the last 168 hours.   Radiology    No results found.  Cardiac Studies   LHC 11/16/2017: Coronary Findings   Diagnostic  Dominance: Right  Left Main  The vessel exhibits minimal luminal irregularities.  Left Anterior Descending  Prox LAD to Mid LAD lesion 10% stenosed  Prox LAD to Mid LAD lesion is 10% stenosed. The lesion was previously treated.  Mid LAD lesion 30% stenosed  Mid LAD lesion is 30% stenosed.  Left Circumflex  Prox Cx to Mid Cx lesion 95% stenosed  Prox Cx to Mid Cx lesion is 95% stenosed.  Right Coronary Artery  Prox RCA to Mid RCA lesion 20% stenosed  Prox RCA to Mid RCA lesion is 20% stenosed. The lesion was previously treatedover 2 years ago.  Mid RCA lesion 30% stenosed  Mid RCA lesion is 30% stenosed.  Mid RCA to Dist RCA lesion 30% stenosed  Mid RCA to Dist RCA lesion is 30% stenosed. The lesion was previously treatedover 2 years ago.  Dist RCA lesion 85%  stenosed  Dist RCA lesion is 85% stenosed. The lesion was not previously treated.  Right Posterior Descending Artery  Ost RPDA to RPDA lesion 95% stenosed  Ost RPDA to RPDA lesion is 95% stenosed.  Right Posterior Atrioventricular Branch  Post Atrio-1 lesion 95% stenosed  Post Atrio-1 lesion is 95% stenosed. Vessel is the culprit lesion. The lesion is type C.  Post Atrio-2 lesion 100% stenosed  Post Atrio-2 lesion is 100% stenosed.  Intervention   Dist RCA lesion  Stent  Lesion length: 11 mm. CATHETER LAUNCHER 6FR JR4 guide catheter was inserted. Lesion crossed with guidewire using a WIRE INTUITION PROPEL ST 180CM. Pre-stent angioplasty was performed using a BALLOON [place] RX 2.0X12. Maximum pressure: 10 atm. Inflation time: 20 sec. A drug-eluting stent was successfully placed using a STENT RESOLUTE ONYX 3.5X12. Maximum pressure: 14 atm. Inflation time: 20 sec. Stent overlaps previously placed stent. Post-stent angioplasty was not performed.  Post-Intervention Lesion Assessment  The intervention was successful. Pre-interventional TIMI flow is 3. Post-intervention TIMI flow is 3. No complications occurred at this lesion.  There is a 0% residual stenosis post intervention.  Post Atrio-1 lesion  Stent  Lesion length: 13 mm. CATHETER LAUNCHER 6FR JR4 guide catheter was inserted. Lesion crossed with guidewire using a WIRE INTUITION PROPEL ST 180CM. Pre-stent angioplasty was performed using a BALLOON [place] RX 2.0X12. Maximum pressure: 8 atm. Inflation time: 30 sec. A drug-eluting stent was successfully placed using a STENT RESOLUTE ONYX 2.5X15. Maximum pressure: 16 atm. Inflation time: 20 sec. Post-stent angioplasty was not performed.  Post-Intervention Lesion Assessment  The intervention was successful. Pre-interventional TIMI flow is 3. Post-intervention TIMI flow is 3. No complications occurred at this lesion.  There is a 0% residual stenosis post intervention.  Wall Motion         Resting               Left Heart   Left Ventricle The left ventricular size is normal. The left ventricular systolic function is normal. LV end diastolic pressure is moderately elevated. The left ventricular ejection fraction is 55-65% by visual estimate. No regional wall motion abnormalities.  Coronary Diagrams   Diagnostic Diagram       Post-Intervention Diagram  Conclusion     Prox RCA to Mid RCA lesion is 20% stenosed.  Mid RCA to Dist RCA lesion is 30% stenosed.  Mid RCA lesion is 30% stenosed.  Dist RCA lesion is 85% stenosed.  Post intervention, there is a 0% residual stenosis.  A drug-eluting stent was successfully placed using a STENT RESOLUTE ONYX 3.5X12.  Ost RPDA to RPDA lesion is 95% stenosed.  A drug-eluting stent was successfully placed using a STENT RESOLUTE ONYX 2.5X15.  Post Atrio-1 lesion is 95% stenosed.  Post intervention, there is a 0% residual stenosis.  Post Atrio-2 lesion is 100% stenosed.  Prox Cx to Mid Cx lesion is 95% stenosed.  Prox LAD to Mid LAD lesion is 10% stenosed.  Mid LAD lesion is 30% stenosed.  The left ventricular systolic function is normal.  LV end diastolic pressure is moderately elevated.  The left ventricular ejection fraction is 55-65% by visual estimate.  1. Severe underlying three-vessel coronary artery disease with patent stents in the LAD and right coronary artery. Severe stenosis in the distal RCA and the posterior AV groove artery likely the culprit for inferior ST elevation MI. There is evidence of embolization into distal PL 3. There is also severe stenosis in the mid left circumflex. 2. Ventricular fibrillation arrest shortly after obtaining arterial access was treated successfully with defibrillation. 3. Normal LV systolic function and moderately elevated left ventricular end-diastolic pressure. 4. Successful angioplasty and drug-eluting stent placement to the distal RCA  as well as posterior AV groove segment.  Recommendations:   Recommend uninterrupted dual antiplatelet therapy with Aspirin 22m daily and Ticagrelor 967mtwice dailyfor a minimum of 12 months (ACS - Class I recommendation).Consider long-term dual antiplatelet therapy given multiple stents. Continue aggressive treatment of risk factors.  Recommend staged left circumflex PCI before hospital discharge likely on Wednesday.    Echo 11/16/2017: Study Conclusions  - Left ventricle: The cavity size was normal. There was mild   concentric hypertrophy. Systolic function was normal. The   estimated ejection fraction was in the range of 55% to 60%. Mild   hypokinesis of the inferior myocardium. Left ventricular   diastolic function parameters were normal.   Patient Profile     [age].o. male with history of CAD with an MI in 2004 s/p PCI to the LAD and RCA at that time s/p repeat PCI to the RCA in 2010, HIV, HLD with noncompliance in statin therapy, Bell's palsy, DDD, kidney stones, and remote tobacco abusewho is being seen today for the evaluation of inferior STEMI.  Assessment & Plan    1. Inferior STEMI/CAD of the native coronary arteries/cardiogenic shock: -Dopamine has been successfully weaned off as of 8/13 -Transferred out of the ICU on 8/13 -No angina -Troponin peaked at 8.38 -Continue DAPT with ASA 81 mg daily and Brilinta 90 mg bid without interruption for at leas the next 12 months -Plan for staged PCI of the LCx today around lunchtime  -Gentle IV hydration as below -Not on beta blocker given cardiogenic shock as above as well as baseline bradycardic heart rate, start as BP/HR allows -Echo as above -Cardiac rehab -Risks and benefits of cardiac catheterization have been discussed with the patient including risks of bleeding, bruising, infection, kidney damage, stroke, heart attack, and death. The patient understands these risks and is willing to proceed with the procedure.  All questions have been answered and concerns listened to  2. AKI: -Improved -Gentle hydration with IV fluids this morning -Will need close monitoring  3. HLD: -Goal LDL <70 -LDL this admission of 82 -Continue Crestor 40 mg daily -Continue newly added Zetia 10 mg daily -Recheck lipid and liver function as an outpatient in ~ 8 weeks, if LDL remains above goal at that time consider PCSK9 inhibitor   4. HIV: -Outpatient follow up -CD4 > 600, viral load < 20 -Per IM  For questions or updates, please contact [place] Please consult [email or web address] for contact info under Cardiology/STEMI.    Signed, Christell Faith, PA-C [place] Pager: [phone] 11/18/2017, 7:24 AM   Attending Note Patient seen and examined, agree with detailed note above,  Patient presentation and plan discussed on rounds.   EKG lab work, chest x-ray, echocardiogram reviewed independently by myself  Denies any further chest pain Long discussion concerning his medication management Was not compliant with his Crestor as outpatient Went for long stretches without the Crestor Reported having some vague joint pain muscle ache  Discussed recent cardiac catheterization findings with him in detail, Partner at the bedside, Stent placed to RCA, residual left circumflex disease  On physical examination no JVD lungs clear to auscultation bilaterally heart sounds regular normal S1-S2 no murmurs appreciated abdomen soft nontender no significant lower extremity edema  Labs reviewed Hematocrit 40, creatinine improved 1.39 sodium 142  troponin down to 4.26  A/P:  STEMI, Inferior wall Stent placed to RCA Plan was for intervention today to mid left circumflex given critical disease Fluoroscopy down in the catheterization lab, unable to perform procedure today Was scheduled with Dr. Saunders Revel He has been rescheduled for tomorrow morning first case with Dr. Chapman Moss, brilinta, Crestor and Zetia Unable to  add blocker or ACE inhibitor given bradycardia and hypotension  I have reviewed the risks, indications, and alternatives to cardiac catheterization, possible angioplasty, and stenting with the patient. Risks include but are not limited to bleeding, infection, vascular injury, stroke, myocardial infection, arrhythmia, kidney injury, radiation-related injury in the case of prolonged fluoroscopy use, emergency cardiac surgery, and death. The patient understands the risks of serious complication is 1-2 in 5638 with diagnostic cardiac cath and 1-2% or less with angioplasty/stenting.   Greater than 50% was spent in counseling and coordination of care with patient Total encounter time 35 minutes or more   Signed: Esmond Plants  M.D., Ph.D. [facility]

## 2017-11-19 ENCOUNTER — Encounter
Admission: EM | Disposition: A | Discharge status: Home / Self Care | Payer: Self-pay | Source: Home / Self Care | Attending: Internal Medicine

## 2017-11-19 DIAGNOSIS — I2510 Atherosclerotic heart disease of native coronary artery without angina pectoris: Secondary | ICD-10-CM

## 2017-11-19 HISTORY — PX: LEFT HEART CATH AND CORONARY ANGIOGRAPHY: CATH118249

## 2017-11-19 LAB — POCT ACTIVATED CLOTTING TIME: Activated Clotting Time: 323 seconds

## 2017-11-19 SURGERY — LEFT HEART CATH AND CORONARY ANGIOGRAPHY
Anesthesia: Moderate Sedation

## 2017-11-19 MED ORDER — MIDAZOLAM HCL 2 MG/2ML IJ SOLN
INTRAMUSCULAR | Status: AC
  Filled 2017-11-19: qty 2

## 2017-11-19 MED ORDER — VERAPAMIL HCL 2.5 MG/ML IV SOLN
INTRAVENOUS | Status: AC
  Filled 2017-11-19: qty 2

## 2017-11-19 MED ORDER — FENTANYL CITRATE (PF) 100 MCG/2ML IJ SOLN
INTRAMUSCULAR | Status: DC | PRN
  Administered 2017-11-19: 25 ug via INTRAVENOUS

## 2017-11-19 MED ORDER — FENTANYL CITRATE (PF) 100 MCG/2ML IJ SOLN
INTRAMUSCULAR | Status: AC
  Filled 2017-11-19: qty 2

## 2017-11-19 MED ORDER — SODIUM CHLORIDE 0.9% FLUSH: 3.0000 mL | INTRAVENOUS | Status: DC | PRN

## 2017-11-19 MED ORDER — NITROGLYCERIN 1 MG/10 ML FOR IR/CATH LAB
INTRA_ARTERIAL | Status: DC | PRN
  Administered 2017-11-19: 200 ug via INTRACORONARY

## 2017-11-19 MED ORDER — LIDOCAINE HCL (PF) 1 % IJ SOLN
INTRAMUSCULAR | Status: AC
  Filled 2017-11-19: qty 30

## 2017-11-19 MED ORDER — SODIUM CHLORIDE 0.9% FLUSH: 3.0000 mL | Freq: Two times a day (BID) | INTRAVENOUS | Status: DC

## 2017-11-19 MED ORDER — SODIUM CHLORIDE 0.9 % WEIGHT BASED INFUSION
1.0000 mL/kg/h | INTRAVENOUS | Status: AC
  Administered 2017-11-19: 1 mL/kg/h via INTRAVENOUS

## 2017-11-19 MED ORDER — HEPARIN SODIUM (PORCINE) 1000 UNIT/ML IJ SOLN
INTRAMUSCULAR | Status: AC
  Filled 2017-11-19: qty 1

## 2017-11-19 MED ORDER — TICAGRELOR 90 MG PO TABS: 90.0000 mg | ORAL_TABLET | Freq: Two times a day (BID) | ORAL | Status: DC

## 2017-11-19 MED ORDER — HEPARIN SODIUM (PORCINE) 1000 UNIT/ML IJ SOLN
INTRAMUSCULAR | Status: DC | PRN
  Administered 2017-11-19: 7000 [IU] via INTRAVENOUS

## 2017-11-19 MED ORDER — ACETAMINOPHEN 325 MG PO TABS
ORAL_TABLET | ORAL | Status: AC
  Filled 2017-11-19: qty 2

## 2017-11-19 MED ORDER — HEPARIN (PORCINE) IN NACL 1000-0.9 UT/500ML-% IV SOLN
INTRAVENOUS | Status: AC
  Filled 2017-11-19: qty 1000

## 2017-11-19 MED ORDER — SODIUM CHLORIDE 0.9 % IV SOLN: 250.0000 mL | INTRAVENOUS | Status: DC | PRN

## 2017-11-19 MED ORDER — MIDAZOLAM HCL 2 MG/2ML IJ SOLN
INTRAMUSCULAR | Status: DC | PRN
  Administered 2017-11-19: 1 mg via INTRAVENOUS

## 2017-11-19 MED ORDER — VERAPAMIL HCL 2.5 MG/ML IV SOLN
INTRAVENOUS | Status: DC | PRN
  Administered 2017-11-19: 2.5 mg via INTRA_ARTERIAL

## 2017-11-19 MED ORDER — NITROGLYCERIN 5 MG/ML IV SOLN
INTRAVENOUS | Status: AC
  Filled 2017-11-19: qty 10

## 2017-11-19 SURGICAL SUPPLY — 13 items
BALLN TREK RX 2.5X12 (BALLOONS) ×2
BALLOON TREK RX 2.5X12 (BALLOONS) ×1 IMPLANT
CATH VISTA GUIDE 6FR XB3.5 (CATHETERS) ×2 IMPLANT
DEVICE INFLAT 30 PLUS (MISCELLANEOUS) ×2 IMPLANT
DEVICE RAD TR BAND REGULAR (VASCULAR PRODUCTS) ×2 IMPLANT
KIT MANI 3VAL PERCEP (MISCELLANEOUS) ×2 IMPLANT
NEEDLE PERC 21GX4CM (NEEDLE) ×2 IMPLANT
PACK CARDIAC CATH (CUSTOM PROCEDURE TRAY) ×2 IMPLANT
SHEATH RAIN RADIAL 21G 6FR (SHEATH) ×2 IMPLANT
STENT RESOLUTE ONYX 2.75X18 (Permanent Stent) ×2 IMPLANT
STENT SIERRA 2.75 X 08 MM (Permanent Stent) ×2 IMPLANT
WIRE INTUITION PROPEL ST 180CM (WIRE) ×2 IMPLANT
WIRE ROSEN-J .035X260CM (WIRE) ×2 IMPLANT

## 2017-11-19 NOTE — Interval H&P Note (Signed)
History and Physical Interval Note:  11/19/2017 4:08 PM  Daniel Black  has presented today for surgery, with the diagnosis of coronary artery disease with inferior ST eleavtion myocardial infarction with residual high-grade stenosis affecting the left circumflex  The various methods of treatment have been discussed with the patient and family. After consideration of risks, benefits and other options for treatment, the patient has consented to  Procedure(s): LEFT HEART CATH AND CORONARY ANGIOGRAPHY (N/A) as a surgical intervention .  The patient's history has been reviewed, patient examined, no change in status, stable for surgery.  I have reviewed the patient's chart and labs.  Questions were answered to the patient's satisfaction.     Kathlyn [place]

## 2017-11-19 NOTE — H&P (View-Only) (Signed)
Progress Note  Patient Name: Daniel Black Date of Encounter: 11/19/2017  Primary Cardiologist: Ida Rogue, MD  Subjective   No chest pain or sob overnight.  On for cath this afternoon.  Inpatient Medications    Scheduled Meds: . aspirin  81 mg Oral Daily  . bictegravir-emtricitabine-tenofovir AF  1 tablet Oral Daily  . ezetimibe  10 mg Oral Daily  . multivitamin with minerals  1 tablet Oral Daily  . rosuvastatin  40 mg Oral Daily  . sodium chloride flush  3 mL Intravenous Q12H  . sodium chloride flush  3 mL Intravenous Q12H  . sodium chloride flush  3 mL Intravenous Q12H  . ticagrelor  90 mg Oral BID   Continuous Infusions: . sodium chloride    . sodium chloride 75 mL/hr at 11/17/17 2158  . sodium chloride    . sodium chloride    . sodium chloride Stopped (11/18/17 1117)  . sodium chloride 1 mL/kg/hr (11/19/17 0543)   PRN Meds: sodium chloride, sodium chloride, sodium chloride, acetaminophen, ondansetron (ZOFRAN) IV, sodium chloride flush, sodium chloride flush, sodium chloride flush   Vital Signs    Vitals:   11/18/17 0758 11/18/17 1610 11/18/17 1933 11/19/17 0435  BP: 100/63 102/67 114/79 99/65  Pulse: (!) 50 (!) 57 60 (!) 55  Resp:   16 16  Temp: 97.6 F (36.4 C) 98.2 F (36.8 C) 97.7 F (36.5 C) 97.8 F (36.6 C)  TempSrc: Oral Oral Oral Oral  SpO2: 100% 100% 98% 97%  Weight:      Height:        Intake/Output Summary (Last 24 hours) at 11/19/2017 0753 Last data filed at 11/19/2017 0643 Gross per 24 hour  Intake 1615.75 ml  Output -  Net 1615.75 ml   Filed Weights   11/16/17 1152 11/16/17 1330 11/18/17 0733  Weight: 79.4 kg 82.6 kg 78.9 kg    Physical Exam   GEN: Well nourished, well developed, in no acute distress.  HEENT: Grossly normal.  Neck: Supple, no JVD, carotid bruits, or masses. Cardiac: RRR, no murmurs, rubs, or gallops. No clubbing, cyanosis, edema.  Radials/DP/PT 2+ and equal bilaterally. R wrist cath site w/o  bleeding/bruit/hematoma. Respiratory:  Respirations regular and unlabored, clear to auscultation bilaterally. GI: Soft, nontender, nondistended, BS + x 4. MS: no deformity or atrophy. Skin: warm and dry, no rash. Neuro:  Strength and sensation are intact. Psych: AAOx3.  Normal affect.  Labs    Chemistry Recent Labs  Lab 11/16/17 1106 11/16/17 1144 11/17/17 0132 11/18/17 0520  NA 141 140 139 142  K 4.8 3.7 4.0 3.7  CL 104 106 106 109  CO2 22 25 30 27   GLUCOSE 109* 111* 92 98  BUN 20 21* 23* 20  CREATININE 1.88* 1.57* 1.70* 1.39*  CALCIUM 9.4 9.4 8.8* 8.9  PROT 7.6  --   --   --   ALBUMIN 4.6  --   --   --   AST 53*  --   --   --   ALT 22  --   --   --   ALKPHOS 72  --   --   --   BILITOT 1.0  --   --   --   GFRNONAA 38* 48* 43* 55*  GFRAA 44* 55* 50* >60  ANIONGAP 15 9 3* 6     Hematology Recent Labs  Lab 11/16/17 1144 11/17/17 0132 11/18/17 0520  WBC 6.4 7.4 5.9  RBC 4.53 4.17* 4.52  HGB 15.3 14.0 15.1  HCT 43.7 40.4 43.7  MCV 96.4 96.7 96.6  MCH 33.8 33.4 33.4  MCHC 35.1 34.6 34.6  RDW 13.7 13.9 13.5  PLT 260 199 203    Cardiac Enzymes Recent Labs  Lab 11/16/17 1329 11/16/17 1916 11/17/17 0132 11/17/17 0924  TROPONINI 0.56* 5.67* 8.38* 4.26*      Radiology    No results found.  Telemetry    RSR/Sinus brady - Personally Reviewed  Cardiac Studies   Cardiac Catheterization and Percutaneous Coronary Intervention 8.12.2019  Left Main  The vessel exhibits minimal luminal irregularities.  Left Anterior Descending  Prox LAD to Mid LAD lesion 10% stenosed  Prox LAD to Mid LAD lesion is 10% stenosed. The lesion was previously treated.  Mid LAD lesion 30% stenosed  Mid LAD lesion is 30% stenosed.  Left Circumflex  Prox Cx to Mid Cx lesion 95% stenosed  Prox Cx to Mid Cx lesion is 95% stenosed.  Right Coronary Artery  Prox RCA to Mid RCA lesion 20% stenosed  Prox RCA to Mid RCA lesion is 20% stenosed. The lesion was previously treatedover 2  years ago.  Mid RCA lesion 30% stenosed  Mid RCA lesion is 30% stenosed.  Mid RCA to Dist RCA lesion 30% stenosed  Mid RCA to Dist RCA lesion is 30% stenosed. The lesion was previously treatedover 2 years ago.  Dist RCA lesion 85% stenosed  Dist RCA lesion is 85% stenosed. The lesion was not previously treated.      **STENT RESOLUTE ONYX 3.5X12 placed **   Right Posterior Descending Artery  Ost RPDA to RPDA lesion 95% stenosed  Ost RPDA to RPDA lesion is 95% stenosed.  Right Posterior Atrioventricular Branch  Post Atrio-1 lesion 95% stenosed  Post Atrio-1 lesion is 95% stenosed. Vessel is the culprit lesion. The lesion is type C.  Post Atrio-2 lesion 100% stenosed  Post Atrio-2 lesion is 100% stenosed.      **STENT RESOLUTE ONYX 2.5X15 placed**    Left Ventricle The left ventricular size is normal. The left ventricular systolic function is normal. LV end diastolic pressure is moderately elevated. The left ventricular ejection fraction is 55-65% by visual estimate. No regional wall motion abnormalities.  _____________  2D Echocardiogram 8.12.2019  Study Conclusions   - Left ventricle: The cavity size was normal. There was mild   concentric hypertrophy. Systolic function was normal. The   estimated ejection fraction was in the range of 55% to 60%. Mild   hypokinesis of the inferior myocardium. Left ventricular   diastolic function parameters were normal. _____________    Patient Profile     57 y.o. male with history of CAD with an MI in 2004 s/p PCI to the LAD and RCA at that time s/p repeat PCI to the RCA in 2010, HIV, HLD with noncompliance in statin therapy, Bell's palsy, DDD, kidney stones, and remote tobacco abusewho is being seen today for the evaluation of inferior STEMI.  Assessment & Plan    1.  Inf STEMI/CAD: H/o CAD s/p prior RCA and LAD stenting, presented 8/12 with chest pain and inf ST elevation complicated by VF arrest during cath. S/p DES to the dRCA and RPAV.  Residual LCX dzs with plan for PCI this afternoon.  No chest pain or dyspnea.  BP remains soft.  Cont asa, statin, zetia.  No  blocker in the setting of relative hypotension and bradycardia.  2.  Cardiogenic shock:  Resolved.  Off dopamine.  BP remains soft, which  is his baseline.  3.  HL: LDL 82.  Cont statin and zetia.  4.  HIV: Cont home rx.    5.  AKI:  In setting of CGS. Creat improved to 1.39 yesterday.  Receiving IVF pre-cath today.  Signed, Murray Hodgkins, NP  11/19/2017, 7:53 AM    For questions or updates, please contact   Please consult [email or web address] for contact info under Cardiology/STEMI.  Attending Note Patient seen and examined, agree with detailed note above,  Patient presentation and plan discussed on rounds.   No complaints overnight, no chest pain or shortness of breath Unable to perform cardiac catheterization yesterday as catheterization lab was down, case delayed until today with Dr. Fletcher Anon  On exam no JVD heart sounds regular normal S1-S2 no murmurs appreciated lungs clear to auscultation bilaterally abdomen soft nontender no significant lower extremity edema  No new lab work this morning for review  A/P:  STEMI, Inferior wall Stent placed to RCA monday Plan was for intervention yesterday to mid left circumflex given critical disease Fluoroscopy down in the catheterization lab,   rescheduled for today Asa, brilinta, Crestor and Zetia Unable to add blocker or ACE inhibitor given bradycardia and hypotension --NPO   Greater than 50% was spent in counseling and coordination of care with patient Total encounter time 25 minutes or more   Signed: Esmond Plants  M.D., Ph.D. [facility]

## 2017-11-19 NOTE — Progress Notes (Signed)
[place] at [facility]                                                                                                                                                                                  Patient Demographics   Daniel Black, is a 57 y.o. male, DOB - 1960/12/04, [place] date - 11/16/2017   Admitting Physician Henreitta Leber, MD  Outpatient Primary MD for the patient is Patient, No Pcp Per   LOS - 3  Subjective: Denies any complaints overnight.  Plan for cath later today    Review of Systems:   CONSTITUTIONAL: No documented fever. No fatigue, weakness. No weight gain, no weight loss.  EYES: No blurry or double vision.  ENT: No tinnitus. No postnasal drip. No redness of the oropharynx.  RESPIRATORY: No cough, no wheeze, no hemoptysis. No dyspnea.  CARDIOVASCULAR: No chest pain. No orthopnea. No palpitations. No syncope.  GASTROINTESTINAL: No nausea, no vomiting or diarrhea. No abdominal pain. No melena or hematochezia.  GENITOURINARY: No dysuria or hematuria.  ENDOCRINE: No polyuria or nocturia. No heat or cold intolerance.  HEMATOLOGY: No anemia. No bruising. No bleeding.  INTEGUMENTARY: No rashes. No lesions.  MUSCULOSKELETAL: No arthritis. No swelling. No gout.  NEUROLOGIC: No numbness, tingling, or ataxia. No seizure-type activity.  PSYCHIATRIC: No anxiety. No insomnia. No ADD.    Vitals:   Vitals:   11/18/17 1933 11/19/17 0435 11/19/17 0807 11/19/17 1255  BP: 114/79 99/65 108/73 108/78  Pulse: 60 (!) 55 (!) 49 (!) 53  Resp: 16 16  13   Temp: 97.7 F (36.5 C) 97.8 F (36.6 C) 97.6 F (36.4 C)   TempSrc: Oral Oral Oral   SpO2: 98% 97% 97% 100%  Weight:      Height:        Wt Readings from Last 3 Encounters:  11/18/17 78.9 kg  11/16/17 79.4 kg  04/20/17 84.4 kg     Intake/Output Summary (Last 24 hours) at 11/19/2017 1413 Last data filed at 11/19/2017 4401 Gross per 24 hour  Intake 1615.75 ml  Output -   Net 1615.75 ml    Physical Exam:   GENERAL: Pleasant-appearing in no apparent distress.  HEAD, EYES, EARS, NOSE AND THROAT: Atraumatic, normocephalic. Extraocular muscles are intact. Pupils equal and reactive to light. Sclerae anicteric. No conjunctival injection. No oro-pharyngeal erythema.  NECK: Supple. There is no jugular venous distention. No bruits, no lymphadenopathy, no thyromegaly.  HEART: Regular rate and rhythm,. No murmurs, no rubs, no clicks.  LUNGS: Clear to auscultation bilaterally. No rales or rhonchi. No wheezes.  ABDOMEN: Soft, flat, nontender, nondistended. Has good bowel sounds. No hepatosplenomegaly appreciated.  EXTREMITIES: No evidence of any cyanosis, clubbing, or peripheral edema.  +2 pedal and radial pulses bilaterally.  NEUROLOGIC: The patient is alert, awake, and oriented x3 with no focal motor or sensory deficits appreciated bilaterally.  SKIN: Moist and warm with no rashes appreciated.  Psych: Not anxious, depressed LN: No inguinal LN enlargement    Antibiotics   Anti-infectives (From admission, onward)   Start     Dose/Rate Route Frequency Ordered Stop   11/17/17 1000  [MAR Hold]  bictegravir-emtricitabine-tenofovir AF (BIKTARVY) 50-200-25 MG per tablet 1 tablet     (MAR Hold since Thu 11/19/2017 at 1300. Reason: Transfer to a Procedural area.)   1 tablet Oral Daily 11/16/17 1314        Medications   Scheduled Meds: . [MAR Hold] aspirin  81 mg Oral Daily  . [MAR Hold] bictegravir-emtricitabine-tenofovir AF  1 tablet Oral Daily  . [MAR Hold] ezetimibe  10 mg Oral Daily  . [MAR Hold] multivitamin with minerals  1 tablet Oral Daily  . [MAR Hold] rosuvastatin  40 mg Oral Daily  . [MAR Hold] sodium chloride flush  3 mL Intravenous Q12H  . sodium chloride flush  3 mL Intravenous Q12H  . sodium chloride flush  3 mL Intravenous Q12H  . [MAR Hold] ticagrelor  90 mg Oral BID   Continuous Infusions: . [MAR Hold] sodium chloride    . sodium chloride 75  mL/hr at 11/17/17 2158  . sodium chloride    . sodium chloride    . sodium chloride Stopped (11/18/17 1117)  . sodium chloride 1 mL/kg/hr (11/19/17 1138)   PRN Meds:.[MAR Hold] sodium chloride, sodium chloride, sodium chloride, [MAR Hold] acetaminophen, [MAR Hold] ondansetron (ZOFRAN) IV, [MAR Hold] sodium chloride flush, sodium chloride flush, sodium chloride flush   Data Review:   Micro Results Recent Results (from the past 240 hour(s))  MRSA PCR Screening     Status: None   Collection Time: 11/16/17  1:31 PM  Result Value Ref Range Status   MRSA by PCR NEGATIVE NEGATIVE Final    Comment:        The GeneXpert MRSA Assay (FDA approved for NASAL specimens only), is one component of a comprehensive MRSA colonization surveillance program. It is not intended to diagnose MRSA infection nor to guide or monitor treatment for MRSA infections. Performed at [facility], [address] [place], [place] [address]     Radiology Reports No results found.   CBC Recent Labs  Lab 11/16/17 1109 11/16/17 1144 11/17/17 0132 11/18/17 0520  WBC 7.2 6.4 7.4 5.9  HGB 16.3 15.3 14.0 15.1  HCT 47.8 43.7 40.4 43.7  PLT 268 260 199 203  MCV 96.7 96.4 96.7 96.6  MCH 33.0 33.8 33.4 33.4  MCHC 34.1 35.1 34.6 34.6  RDW 13.5 13.7 13.9 13.5  LYMPHSABS 2.5  --   --   --   MONOABS 0.5  --   --   --   EOSABS 0.2  --   --   --   BASOSABS 0.1  --   --   --     Chemistries  Recent Labs  Lab 11/16/17 1106 11/16/17 1144 11/17/17 0132 11/18/17 0520  NA 141 140 139 142  K 4.8 3.7 4.0 3.7  CL 104 106 106 109  CO2 22 25 30 27   GLUCOSE 109* 111* 92 98  BUN 20 21* 23* 20  CREATININE 1.88* 1.57* 1.70* 1.39*  CALCIUM 9.4 9.4 8.8* 8.9  MG  --  2.2  --   --   AST 53*  --   --   --   ALT 22  --   --   --   ALKPHOS 72  --   --   --   BILITOT 1.0  --   --   --     ------------------------------------------------------------------------------------------------------------------ estimated creatinine clearance is 61.3 mL/min (A) (by C-G formula based on SCr of 1.39 mg/dL (H)). ------------------------------------------------------------------------------------------------------------------ No results for input(s): HGBA1C in the last 72 hours. ------------------------------------------------------------------------------------------------------------------ Recent Labs    11/17/17 0132  CHOL 135  HDL 42  LDLCALC 82  TRIG 54  CHOLHDL 3.2   ------------------------------------------------------------------------------------------------------------------ No results for input(s): TSH, T4TOTAL, T3FREE, THYROIDAB in the last 72 hours.  Invalid input(s): FREET3 ------------------------------------------------------------------------------------------------------------------ No results for input(s): VITAMINB12, FOLATE, FERRITIN, TIBC, IRON, RETICCTPCT in the last 72 hours.  Coagulation profile Recent Labs  Lab 11/16/17 1144  INR 0.90    No results for input(s): DDIMER in the last 72 hours.  Cardiac Enzymes Recent Labs  Lab 11/16/17 1916 11/17/17 0132 11/17/17 0924  TROPONINI 5.67* 8.38* 4.26*   ------------------------------------------------------------------------------------------------------------------ Invalid input(s): POCBNP    Assessment & Plan   57 year old male with past medical history of HIV, hyperlipidemia, coronary disease status post previous stent, who presents to the hospital due to chest pain noted to have an ST elevation MI.  1.  ST elevation MI- status post 2 stents, other stent today to the circumflex  -Continue aspirin, Brilinta, Crestor.   -Appreciate cardiology consult,   2.  Ventricular tachycardia related to acute MI continue to monitor  3. Acute renal failure improved with IV fluid  4. History of  HIV-continue HAART.  5.  Hyperlipidemia-continue Crestor.  Discharge tomm      Code Status Orders  (From admission, onward)         Start     Ordered   11/16/17 1315  Full code  Continuous     11/16/17 1314        Code Status History    This patient has a current code status but no historical code status.           Consults cardiology DVT Prophylaxis heparin  Lab Results  Component Value Date   PLT 203 11/18/2017     Time Spent in minutes  60min  Greater than 50% of time spent in care coordination and counseling patient regarding the condition and plan of care.   Dustin Flock M.D on 11/19/2017 at 2:13 PM  Between 7am to 6pm - Pager - [phone]  After 6pm go to [email or web address] - Proofreader  [employer]   Office  [phone]

## 2017-11-19 NOTE — Progress Notes (Addendum)
Progress Note  Patient Name: Daniel Black Date of Encounter: 11/19/2017  Primary Cardiologist: Ida Rogue, MD  Subjective   No chest pain or sob overnight.  On for cath this afternoon.  Inpatient Medications    Scheduled Meds: . aspirin  81 mg Oral Daily  . bictegravir-emtricitabine-tenofovir AF  1 tablet Oral Daily  . ezetimibe  10 mg Oral Daily  . multivitamin with minerals  1 tablet Oral Daily  . rosuvastatin  40 mg Oral Daily  . sodium chloride flush  3 mL Intravenous Q12H  . sodium chloride flush  3 mL Intravenous Q12H  . sodium chloride flush  3 mL Intravenous Q12H  . ticagrelor  90 mg Oral BID   Continuous Infusions: . sodium chloride    . sodium chloride 75 mL/hr at 11/17/17 2158  . sodium chloride    . sodium chloride    . sodium chloride Stopped (11/18/17 1117)  . sodium chloride 1 mL/kg/hr (11/19/17 0543)   PRN Meds: sodium chloride, sodium chloride, sodium chloride, acetaminophen, ondansetron (ZOFRAN) IV, sodium chloride flush, sodium chloride flush, sodium chloride flush   Vital Signs    Vitals:   11/18/17 0758 11/18/17 1610 11/18/17 1933 11/19/17 0435  BP: 100/63 102/67 114/79 99/65  Pulse: (!) 50 (!) 57 60 (!) 55  Resp:   16 16  Temp: 97.6 F (36.4 C) 98.2 F (36.8 C) 97.7 F (36.5 C) 97.8 F (36.6 C)  TempSrc: Oral Oral Oral Oral  SpO2: 100% 100% 98% 97%  Weight:      Height:        Intake/Output Summary (Last 24 hours) at 11/19/2017 0753 Last data filed at 11/19/2017 0643 Gross per 24 hour  Intake 1615.75 ml  Output -  Net 1615.75 ml   Filed Weights   11/16/17 1152 11/16/17 1330 11/18/17 0733  Weight: 79.4 kg 82.6 kg 78.9 kg    Physical Exam   GEN: Well nourished, well developed, in no acute distress.  HEENT: Grossly normal.  Neck: Supple, no JVD, carotid bruits, or masses. Cardiac: RRR, no murmurs, rubs, or gallops. No clubbing, cyanosis, edema.  Radials/DP/PT 2+ and equal bilaterally. R wrist cath site w/o  bleeding/bruit/hematoma. Respiratory:  Respirations regular and unlabored, clear to auscultation bilaterally. GI: Soft, nontender, nondistended, BS + x 4. MS: no deformity or atrophy. Skin: warm and dry, no rash. Neuro:  Strength and sensation are intact. Psych: AAOx3.  Normal affect.  Labs    Chemistry Recent Labs  Lab 11/16/17 1106 11/16/17 1144 11/17/17 0132 11/18/17 0520  NA 141 140 139 142  K 4.8 3.7 4.0 3.7  CL 104 106 106 109  CO2 22 25 30 27   GLUCOSE 109* 111* 92 98  BUN 20 21* 23* 20  CREATININE 1.88* 1.57* 1.70* 1.39*  CALCIUM 9.4 9.4 8.8* 8.9  PROT 7.6  --   --   --   ALBUMIN 4.6  --   --   --   AST 53*  --   --   --   ALT 22  --   --   --   ALKPHOS 72  --   --   --   BILITOT 1.0  --   --   --   GFRNONAA 38* 48* 43* 55*  GFRAA 44* 55* 50* >60  ANIONGAP 15 9 3* 6     Hematology Recent Labs  Lab 11/16/17 1144 11/17/17 0132 11/18/17 0520  WBC 6.4 7.4 5.9  RBC 4.53 4.17* 4.52  HGB 15.3 14.0 15.1  HCT 43.7 40.4 43.7  MCV 96.4 96.7 96.6  MCH 33.8 33.4 33.4  MCHC 35.1 34.6 34.6  RDW 13.7 13.9 13.5  PLT 260 199 203    Cardiac Enzymes Recent Labs  Lab 11/16/17 1329 11/16/17 1916 11/17/17 0132 11/17/17 0924  TROPONINI 0.56* 5.67* 8.38* 4.26*      Radiology    No results found.  Telemetry    RSR/Sinus brady - Personally Reviewed  Cardiac Studies   Cardiac Catheterization and Percutaneous Coronary Intervention 8.12.2019  Left Main  The vessel exhibits minimal luminal irregularities.  Left Anterior Descending  Prox LAD to Mid LAD lesion 10% stenosed  Prox LAD to Mid LAD lesion is 10% stenosed. The lesion was previously treated.  Mid LAD lesion 30% stenosed  Mid LAD lesion is 30% stenosed.  Left Circumflex  Prox Cx to Mid Cx lesion 95% stenosed  Prox Cx to Mid Cx lesion is 95% stenosed.  Right Coronary Artery  Prox RCA to Mid RCA lesion 20% stenosed  Prox RCA to Mid RCA lesion is 20% stenosed. The lesion was previously treatedover 2  years ago.  Mid RCA lesion 30% stenosed  Mid RCA lesion is 30% stenosed.  Mid RCA to Dist RCA lesion 30% stenosed  Mid RCA to Dist RCA lesion is 30% stenosed. The lesion was previously treatedover 2 years ago.  Dist RCA lesion 85% stenosed  Dist RCA lesion is 85% stenosed. The lesion was not previously treated.      **STENT RESOLUTE ONYX 3.5X12 placed **   Right Posterior Descending Artery  Ost RPDA to RPDA lesion 95% stenosed  Ost RPDA to RPDA lesion is 95% stenosed.  Right Posterior Atrioventricular Branch  Post Atrio-1 lesion 95% stenosed  Post Atrio-1 lesion is 95% stenosed. Vessel is the culprit lesion. The lesion is type C.  Post Atrio-2 lesion 100% stenosed  Post Atrio-2 lesion is 100% stenosed.      **STENT RESOLUTE ONYX 2.5X15 placed**    Left Ventricle The left ventricular size is normal. The left ventricular systolic function is normal. LV end diastolic pressure is moderately elevated. The left ventricular ejection fraction is 55-65% by visual estimate. No regional wall motion abnormalities.  _____________  2D Echocardiogram 8.12.2019  Study Conclusions   - Left ventricle: The cavity size was normal. There was mild   concentric hypertrophy. Systolic function was normal. The   estimated ejection fraction was in the range of 55% to 60%. Mild   hypokinesis of the inferior myocardium. Left ventricular   diastolic function parameters were normal. _____________    Patient Profile     57 y.o. male with history of CAD with an MI in 2004 s/p PCI to the LAD and RCA at that time s/p repeat PCI to the RCA in 2010, HIV, HLD with noncompliance in statin therapy, Bell's palsy, DDD, kidney stones, and remote tobacco abusewho is being seen today for the evaluation of inferior STEMI.  Assessment & Plan    1.  Inf STEMI/CAD: H/o CAD s/p prior RCA and LAD stenting, presented 8/12 with chest pain and inf ST elevation complicated by VF arrest during cath. S/p DES to the dRCA and RPAV.  Residual LCX dzs with plan for PCI this afternoon.  No chest pain or dyspnea.  BP remains soft.  Cont asa, statin, zetia.  No  blocker in the setting of relative hypotension and bradycardia.  2.  Cardiogenic shock:  Resolved.  Off dopamine.  BP remains soft, which  is his baseline.  3.  HL: LDL 82.  Cont statin and zetia.  4.  HIV: Cont home rx.    5.  AKI:  In setting of CGS. Creat improved to 1.39 yesterday.  Receiving IVF pre-cath today.  Signed, Murray Hodgkins, NP  11/19/2017, 7:53 AM    For questions or updates, please contact   Please consult [email or web address] for contact info under Cardiology/STEMI.  Attending Note Patient seen and examined, agree with detailed note above,  Patient presentation and plan discussed on rounds.   No complaints overnight, no chest pain or shortness of breath Unable to perform cardiac catheterization yesterday as catheterization lab was down, case delayed until today with Dr. Fletcher Anon  On exam no JVD heart sounds regular normal S1-S2 no murmurs appreciated lungs clear to auscultation bilaterally abdomen soft nontender no significant lower extremity edema  No new lab work this morning for review  A/P:  STEMI, Inferior wall Stent placed to RCA monday Plan was for intervention yesterday to mid left circumflex given critical disease Fluoroscopy down in the catheterization lab,   rescheduled for today Asa, brilinta, Crestor and Zetia Unable to add blocker or ACE inhibitor given bradycardia and hypotension --NPO   Greater than 50% was spent in counseling and coordination of care with patient Total encounter time 25 minutes or more   Signed: Esmond Plants  M.D., Ph.D. [facility]

## 2017-11-20 ENCOUNTER — Telehealth: Payer: Self-pay | Admitting: *Deleted

## 2017-11-20 ENCOUNTER — Encounter: Payer: Self-pay | Admitting: Cardiovascular Disease

## 2017-11-20 DIAGNOSIS — I472 Ventricular tachycardia: Secondary | ICD-10-CM | POA: Diagnosis not present

## 2017-11-20 DIAGNOSIS — E785 Hyperlipidemia, unspecified: Secondary | ICD-10-CM | POA: Diagnosis not present

## 2017-11-20 DIAGNOSIS — N179 Acute kidney failure, unspecified: Secondary | ICD-10-CM | POA: Diagnosis not present

## 2017-11-20 DIAGNOSIS — I2119 ST elevation (STEMI) myocardial infarction involving other coronary artery of inferior wall: Secondary | ICD-10-CM

## 2017-11-20 DIAGNOSIS — Z21 Asymptomatic human immunodeficiency virus [HIV] infection status: Secondary | ICD-10-CM | POA: Diagnosis not present

## 2017-11-20 DIAGNOSIS — I2510 Atherosclerotic heart disease of native coronary artery without angina pectoris: Secondary | ICD-10-CM

## 2017-11-20 DIAGNOSIS — R570 Cardiogenic shock: Secondary | ICD-10-CM | POA: Diagnosis not present

## 2017-11-20 DIAGNOSIS — I4901 Ventricular fibrillation: Secondary | ICD-10-CM | POA: Diagnosis not present

## 2017-11-20 DIAGNOSIS — Z955 Presence of coronary angioplasty implant and graft: Secondary | ICD-10-CM | POA: Diagnosis not present

## 2017-11-20 LAB — BASIC METABOLIC PANEL
Anion gap: 5 (ref 5–15)
BUN: 20 mg/dL (ref 6–20)
CO2: 26 mmol/L (ref 22–32)
Calcium: 8.7 mg/dL — ABNORMAL LOW (ref 8.9–10.3)
Chloride: 110 mmol/L (ref 98–111)
Creatinine, Ser: 1.3 mg/dL — ABNORMAL HIGH (ref 0.61–1.24)
GFR calc Af Amer: >60 mL/min (ref >60)
GFR, EST NON AFRICAN AMERICAN: 60 mL/min — AB (ref >60)
Glucose, Bld: 95 mg/dL (ref 70–99)
POTASSIUM: 4 mmol/L (ref 3.5–5.1)
SODIUM: 141 mmol/L (ref 135–145)

## 2017-11-20 LAB — TSH: TSH: 4.546 u[IU]/mL — AB (ref 0.350–4.500)

## 2017-11-20 LAB — CBC
HEMATOCRIT: 40.3 % (ref 40.0–52.0)
Hemoglobin: 14.3 g/dL (ref 13.0–18.0)
MCH: 34.1 pg — ABNORMAL HIGH (ref 26.0–34.0)
MCHC: 35.5 g/dL (ref 32.0–36.0)
MCV: 96.1 fL (ref 80.0–100.0)
PLATELETS: 199 10*3/uL (ref 150–440)
RBC: 4.19 MIL/uL — ABNORMAL LOW (ref 4.40–5.90)
RDW: 13.4 % (ref 11.5–14.5)
WBC: 6.9 10*3/uL (ref 3.8–10.6)

## 2017-11-20 LAB — MAGNESIUM: MAGNESIUM: 1.9 mg/dL (ref 1.7–2.4)

## 2017-11-20 MED ORDER — TICAGRELOR 90 MG PO TABS: 90.0000 mg | ORAL_TABLET | Freq: Two times a day (BID) | ORAL | 2 refills | Status: DC

## 2017-11-20 MED ORDER — ROSUVASTATIN CALCIUM 40 MG PO TABS: 40.0000 mg | ORAL_TABLET | Freq: Every day | ORAL | 3 refills | Status: DC

## 2017-11-20 MED ORDER — ASPIRIN 81 MG PO CHEW: 81.0000 mg | CHEWABLE_TABLET | Freq: Every day | ORAL | Status: DC

## 2017-11-20 MED ORDER — EZETIMIBE 10 MG PO TABS: 10.0000 mg | ORAL_TABLET | Freq: Every day | ORAL | 0 refills | Status: DC

## 2017-11-20 NOTE — Discharge Summary (Signed)
[place] at [facility], 57 y.o., DOB 1960/10/29, MRN [id number]. Admission date: 11/16/2017 Discharge Date 11/20/2017 Primary MD Patient, No Pcp Per Admitting Physician Henreitta Leber, MD  Admission Diagnosis  Acute ST elevation myocardial infarction (STEMI) of inferior wall ([place]) [I21.19]  Discharge Diagnosis   Principal Problem:   Acute ST elevation myocardial infarction (STEMI) of inferior wall (HCC) Ventricular tachycardia post MI   Human immunodeficiency virus (HIV) disease ([place])   Hyperlipidemia   CAD in native artery   AKI (acute kidney injury) [facility])          Hospital Course  Patient is a 57 year old male with history of HIV hyperlipidemia with previous stents for coronary artery disease presented with chest pain.  Patient was noted to have ST elevation MI.  He was seen by cardiology and taken to the cardiac Cath Lab.  He had 2 stents placed.  And then had a staged procedure next day.  Patient tolerated the procedure without any complications currently his chest pain-free.  He is strongly recommended to adhere to his medical regimen.        Consults  cardiology  Significant Tests:  See full reports for all details    No results found.     Today   Subjective:   Daniel Black patient denying any chest pain currently Objective:   Blood pressure 118/80, pulse (!) 51, temperature 97.6 F (36.4 C), temperature source Oral, resp. rate 18, height 5\' 10"  (1.778 m), weight 78.9 kg, SpO2 97 %.  .  Intake/Output Summary (Last 24 hours) at 11/20/2017 1310 Last data filed at 11/19/2017 1700 Gross per 24 hour  Intake -  Output 750 ml  Net -750 ml    Exam VITAL SIGNS: Blood pressure 118/80, pulse (!) 51, temperature 97.6 F (36.4 C), temperature source Oral, resp. rate 18, height 5\' 10"  (1.778 m), weight 78.9 kg, SpO2 97 %.  GENERAL:  57 y.o.-year-old patient lying in the bed with no acute distress.  EYES: Pupils  equal, round, reactive to light and accommodation. No scleral icterus. Extraocular muscles intact.  HEENT: Head atraumatic, normocephalic. Oropharynx and nasopharynx clear.  NECK:  Supple, no jugular venous distention. No thyroid enlargement, no tenderness.  LUNGS: Normal breath sounds bilaterally, no wheezing, rales,rhonchi or crepitation. No use of accessory muscles of respiration.  CARDIOVASCULAR: S1, S2 normal. No murmurs, rubs, or gallops.  ABDOMEN: Soft, nontender, nondistended. Bowel sounds present. No organomegaly or mass.  EXTREMITIES: No pedal edema, cyanosis, or clubbing.  NEUROLOGIC: Cranial nerves II through XII are intact. Muscle strength 5/5 in all extremities. Sensation intact. Gait not checked.  PSYCHIATRIC: The patient is alert and oriented x 3.  SKIN: No obvious rash, lesion, or ulcer.   Data Review     CBC w Diff:  Lab Results  Component Value Date   WBC 6.9 11/20/2017   HGB 14.3 11/20/2017   HGB 15.1 04/20/2015   HCT 40.3 11/20/2017   HCT 43.8 04/20/2015   PLT 199 11/20/2017   PLT 247 04/20/2015   LYMPHOPCT 35 11/16/2017   LYMPHOPCT 28.7 07/21/2014   MONOPCT 8 11/16/2017   MONOPCT 5.8 07/21/2014   EOSPCT 2 11/16/2017   EOSPCT 2.0 07/21/2014   BASOPCT 1 11/16/2017   BASOPCT 0.6 07/21/2014   CMP:  Lab Results  Component Value Date   NA 141 11/20/2017   NA 139 07/21/2014   K 4.0 11/20/2017   K 3.9 07/21/2014   CL 110 11/20/2017  CL 105 07/21/2014   CO2 26 11/20/2017   CO2 28 07/21/2014   BUN 20 11/20/2017   BUN 29 (H) 07/21/2014   CREATININE 1.30 (H) 11/20/2017   CREATININE 1.02 07/21/2014   PROT 7.6 11/16/2017   PROT 6.9 04/18/2016   PROT 6.8 07/21/2014   ALBUMIN 4.6 11/16/2017   ALBUMIN 4.7 04/18/2016   ALBUMIN 4.1 07/21/2014   BILITOT 1.0 11/16/2017   BILITOT 0.3 04/18/2016   BILITOT 0.2 (L) 07/21/2014   ALKPHOS 72 11/16/2017   ALKPHOS 90 07/21/2014   AST 53 (H) 11/16/2017   AST 24 07/21/2014   ALT 22 11/16/2017   ALT 19 07/21/2014   .  Micro Results Recent Results (from the past 240 hour(s))  MRSA PCR Screening     Status: None   Collection Time: 11/16/17  1:31 PM  Result Value Ref Range Status   MRSA by PCR NEGATIVE NEGATIVE Final    Comment:        The GeneXpert MRSA Assay (FDA approved for NASAL specimens only), is one component of a comprehensive MRSA colonization surveillance program. It is not intended to diagnose MRSA infection nor to guide or monitor treatment for MRSA infections. Performed at [facility], [address] [place], [place] [address]         Code Status Orders  (From admission, onward)         Start     Ordered   11/16/17 1315  Full code  Continuous     11/16/17 1314        Code Status History    This patient has a current code status but no historical code status.          Follow-up Information    Revelo, Elyse Jarvis, MD. Go on 11/24/2017.   Specialty:  Family Medicine Why:  at 840AM Contact information: [address] [place] [address] [phone]        [name], MD. Go on 11/23/2017.   Specialty:  Cardiology Why:  at 11:00AM Contact information: [place] [address] [phone]           Discharge Medications   Allergies as of 11/20/2017   No Known Allergies     Medication List    STOP taking these medications   aspirin 325 MG EC tablet Replaced by:  aspirin 81 MG chewable tablet     TAKE these medications   aspirin 81 MG chewable tablet Chew 1 tablet (81 mg total) by mouth daily. Replaces:  aspirin 325 MG EC tablet   bictegravir-emtricitabine-tenofovir AF 50-200-25 MG Tabs tablet Commonly known as:  BIKTARVY Take 1 tablet by mouth daily.   ezetimibe 10 MG tablet Commonly known as:  ZETIA Take 1 tablet (10 mg total) by mouth daily.   multivitamin tablet Take 1 tablet by mouth daily.   rosuvastatin 40 MG tablet Commonly known as:  CRESTOR Take 1 tablet  (40 mg total) by mouth daily.   ticagrelor 90 MG Tabs tablet Commonly known as:  BRILINTA Take 1 tablet (90 mg total) by mouth 2 (two) times daily.          Total Time in preparing paper work, data evaluation and todays exam - 65 minutes  Dustin Flock M.D on 11/20/2017 at 1:10 PM [place]  [phone]

## 2017-11-20 NOTE — Telephone Encounter (Signed)
-----   Message from Rise Mu, PA-C sent at 11/20/2017  7:50 AM EDT ----- Patient needs follow up BMET on 8/19. Dx: AKI. He has a hospital follow up with Dr. Rockey Situ on 8/19. If possible, we should push this back about a week.

## 2017-11-20 NOTE — Progress Notes (Signed)
Patient had 5 beats of VT. On re-assessment, he was asymptomatic. Paged Dr. Jodell Cipro  via amion, no new order received. Will continue to monitor.

## 2017-11-20 NOTE — Progress Notes (Addendum)
Progress Note  Patient Name: Daniel Black Date of Encounter: 11/20/2017  Primary Cardiologist: Rockey Situ  Subjective   Status post staged PCI to the LCx as below on 11/19/2017. Post cath renal function improved to 1.30. Potassium at goal. HGB normal. BP well controlled. Remains with sinus bradycardia not on beta blocker.   No chest pain or dyspnea. Has been ambulating without issues. No complications from the cath site. Tolerating medications.   Inpatient Medications    Scheduled Meds: . aspirin  81 mg Oral Daily  . bictegravir-emtricitabine-tenofovir AF  1 tablet Oral Daily  . ezetimibe  10 mg Oral Daily  . multivitamin with minerals  1 tablet Oral Daily  . rosuvastatin  40 mg Oral Daily  . sodium chloride flush  3 mL Intravenous Q12H  . sodium chloride flush  3 mL Intravenous Q12H  . ticagrelor  90 mg Oral BID   Continuous Infusions: . sodium chloride 75 mL/hr at 11/19/17 2021  . sodium chloride     PRN Meds: sodium chloride, acetaminophen, ondansetron (ZOFRAN) IV, sodium chloride flush, sodium chloride flush   Vital Signs    Vitals:   11/19/17 1900 11/19/17 1915 11/19/17 2006 11/20/17 0539  BP: 97/67 101/66 112/79 123/83  Pulse: (!) 55 72 63 (!) 52  Resp: 18 18 18 18   Temp:   97.7 F (36.5 C) 97.7 F (36.5 C)  TempSrc:   Oral Oral  SpO2: 100% 99% 99% 100%  Weight:      Height:        Intake/Output Summary (Last 24 hours) at 11/20/2017 0731 Last data filed at 11/19/2017 1700 Gross per 24 hour  Intake -  Output 750 ml  Net -750 ml   Filed Weights   11/16/17 1152 11/16/17 1330 11/18/17 0733  Weight: 79.4 kg 82.6 kg 78.9 kg    Telemetry    Sinus bradycardia, 50s bpm, 5 beats of NSVT - Personally Reviewed  ECG    Sinus bradycardia, 53 bpm, prior inferior infarct, no acute st/t changes - Personally Reviewed  Physical Exam   GEN: No acute distress.   Neck: No JVD. Cardiac: RRR, no murmurs, rubs, or gallops. Right radial cardiac cath site is  without bleeding, bruising, swelling, erythema, warmth, or TTP. Radial pulse 2+.  Respiratory: Clear to auscultation bilaterally.  GI: Soft, nontender, non-distended.   MS: No edema; No deformity. Neuro:  Alert and oriented x 3; Nonfocal.  Psych: Normal affect.  Labs    Chemistry Recent Labs  Lab 11/16/17 1106  11/17/17 0132 11/18/17 0520 11/20/17 0331  NA 141   < > 139 142 141  K 4.8   < > 4.0 3.7 4.0  CL 104   < > 106 109 110  CO2 22   < > 30 27 26   GLUCOSE 109*   < > 92 98 95  BUN 20   < > 23* 20 20  CREATININE 1.88*   < > 1.70* 1.39* 1.30*  CALCIUM 9.4   < > 8.8* 8.9 8.7*  PROT 7.6  --   --   --   --   ALBUMIN 4.6  --   --   --   --   AST 53*  --   --   --   --   ALT 22  --   --   --   --   ALKPHOS 72  --   --   --   --   BILITOT 1.0  --   --   --   --  GFRNONAA 38*   < > 43* 55* 60*  GFRAA 44*   < > 50* >60 >60  ANIONGAP 15   < > 3* 6 5   < > = values in this interval not displayed.     Hematology Recent Labs  Lab 11/17/17 0132 11/18/17 0520 11/20/17 0331  WBC 7.4 5.9 6.9  RBC 4.17* 4.52 4.19*  HGB 14.0 15.1 14.3  HCT 40.4 43.7 40.3  MCV 96.7 96.6 96.1  MCH 33.4 33.4 34.1*  MCHC 34.6 34.6 35.5  RDW 13.9 13.5 13.4  PLT 199 203 199    Cardiac Enzymes Recent Labs  Lab 11/16/17 1329 11/16/17 1916 11/17/17 0132 11/17/17 0924  TROPONINI 0.56* 5.67* 8.38* 4.26*   No results for input(s): TROPIPOC in the last 168 hours.   BNPNo results for input(s): BNP, PROBNP in the last 168 hours.   DDimer No results for input(s): DDIMER in the last 168 hours.   Radiology    No results found.  Cardiac Studies   Cardiac catheterization and Percutaneous Coronary Intervention  8.15.2019  Left Main  The vessel exhibits minimal luminal irregularities.  Left Anterior Descending  Prox LAD to Mid LAD lesion 10% stenosed  Prox LAD to Mid LAD lesion is 10% stenosed. The lesion was previously treated.  Mid LAD lesion 30% stenosed  Mid LAD lesion is 30% stenosed.    Left Circumflex  Prox Cx to Mid Cx lesion 95% stenosed  Prox Cx to Mid Cx lesion is 95% stenosed.       **STENT RESOLUTE ONYX [id number] placed**   Right Coronary Artery  Prox RCA to Mid RCA lesion 20% stenosed  Prox RCA to Mid RCA lesion is 20% stenosed. The lesion was previously treatedover 2 years ago.  Mid RCA lesion 30% stenosed  Mid RCA lesion is 30% stenosed.  Mid RCA to Dist RCA lesion 30% stenosed  Mid RCA to Dist RCA lesion is 30% stenosed. The lesion was previously treatedover 2 years ago.  Dist RCA lesion 0% stenosed  Previously placed Dist RCA drug eluting stent is widely patent.  Right Posterior Descending Artery  Ost RPDA to RPDA lesion 95% stenosed  Ost RPDA to RPDA lesion is 95% stenosed.  Right Posterior Atrioventricular Branch  Post Atrio-1 lesion 0% stenosed  Previously placed Post Atrio-1 drug eluting stent is widely patent. Vessel is the culprit lesion. The lesion is type C.  Post Atrio-2 lesion 100% stenosed  Post Atrio-2 lesion is 100% stenosed.   _____________  Cardiac Catheterization and Percutaneous Coronary Intervention 8.12.2019     Left Main  The vessel exhibits minimal luminal irregularities.  Left Anterior Descending  Prox LAD to Mid LAD lesion 10% stenosed  Prox LAD to Mid LAD lesion is 10% stenosed. The lesion was previously treated.  Mid LAD lesion 30% stenosed  Mid LAD lesion is 30% stenosed.  Left Circumflex   Prox Cx to Mid Cx lesion 95% stenosed   Prox Cx to Mid Cx lesion is 95% stenosed.   Right Coronary Artery  Prox RCA to Mid RCA lesion 20% stenosed  Prox RCA to Mid RCA lesion is 20% stenosed. The lesion was previously treatedover 2 years ago.  Mid RCA lesion 30% stenosed  Mid RCA lesion is 30% stenosed.  Mid RCA to Dist RCA lesion 30% stenosed  Mid RCA to Dist RCA lesion is 30% stenosed. The lesion was previously treatedover 2 years ago.  Dist RCA lesion 85% stenosed  Dist RCA lesion is 85% stenosed. The  lesion was not  previously treated.      **STENT RESOLUTE ONYX 3.5X12 placed **   Right Posterior Descending Artery   Ost RPDA to RPDA lesion 95% stenosed   Ost RPDA to RPDA lesion is 95% stenosed.   Right Posterior Atrioventricular Branch  Post Atrio-1 lesion 95% stenosed  Post Atrio-1 lesion is 95% stenosed. Vessel is the culprit lesion. The lesion is type C.  Post Atrio-2 lesion 100% stenosed  Post Atrio-2 lesion is 100% stenosed.      **STENT RESOLUTE ONYX 2.5X15 placed**    Left Ventricle The left ventricular size is normal. The left ventricular systolic function is normal. LV end diastolic pressure is moderately elevated. The left ventricular ejection fraction is 55-65% by visual estimate. No regional wall motion abnormalities.  _____________  2D Echocardiogram 8.12.2019  Study Conclusions  - Left ventricle: The cavity size was normal. There was mild concentric hypertrophy. Systolic function was normal. The estimated ejection fraction was in the range of 55% to 60%. Mild hypokinesis of the inferior myocardium. Left ventricular diastolic function parameters were normal.   Patient Profile     57 y.o. male with history of CAD with an MI in 2004 s/p PCI to the LAD and RCA at that time s/p repeat PCI to the RCA in 2010, HIV, HLD with noncompliance in statin therapy, Bell's palsy, DDD, kidney stones, and remote tobacco abusewho is being seen today for the evaluation of inferior STEMI.  Assessment & Plan    1. Inferior STEMI/CAD s/p prior RCA and LAD stenting complicated by VF arrest during cath: -No chest pain or dyspnea  -Continue DAPT with ASA 81 mg daily and Brilinta for at least 12 months -Given multiple stents he will require long term DAPT beyond 12 months -Cardiac rehab -Not on beta blocker in the setting of relative hypotension and baseline bradycardia -Crestor and Zetia as below  2. Cardiogenic shock: -Resolved -Off dopamine -BP stable  3.  NSVT: -Asymptomatic -Check magnesium with recommendation to replete to goal > 2.0 as indicated  -Potassium at goal  -Check TSH -Not on a beta blocker given underlying sinus bradycardia in the 50s bpm at baseline  4. AKI: -Renal function continues to improve -Will need follow up bmet in our office on 8/19 (message sent)  5. HLD: -LDL of 82 this admission -Crestor and Zetia -Outpatient fasting lipid and liver function in 8 weeks, if not at goal < 70 at that time consider PCSK9 inhibitor  6. HIV: -Continue PTA medication -Per PCP/IM  For questions or updates, please contact [place] Please consult [email or web address] for contact info under Cardiology/STEMI.    Signed, Christell Faith, PA-C [place] Pager: [phone] 11/20/2017, 7:31 AM   Attending Note Patient seen and examined, agree with detailed note above,  Patient presentation and plan discussed on rounds.   No complaints overnight, no chest pain or shortness of breath  cardiac catheterization yesterday  Stent to LCX  On exam no JVD heart sounds regular normal S1-S2 no murmurs appreciated lungs clear to auscultation bilaterally abdomen soft nontender no significant lower extremity edema  A/P: STEMI, Inferior wall Stent placed to RCA Monday Stent placed to left circumflex yesterday Asa, brilinta, Crestor and Zetia Unable to add blocker or ACE inhibitor given bradycardia and hypotension Discharge home today Long discussion concerning discharge planning and instructions Medication compliance recommended   Greater than 50% was spent in counseling and coordination of care with patient Total encounter time 25 minutes or more  Signed: Esmond Plants  M.D., Ph.D. [facility]

## 2017-11-20 NOTE — Progress Notes (Signed)
RN removed IV in R AC, patient had removed the other IV prior.  Patient left with his partner in a private vehicle.  Phillis Knack, RN

## 2017-11-20 NOTE — Telephone Encounter (Signed)
S/w patient. He verbalized understanding that we will change appointment to 11/30/17 at 2:20 pm with Dr Rockey Situ. He verbalized understanding to go to the [facility] this Monday, 11/23/17 for BMP. He was appreciative and glad to be home and out of the hospital.

## 2017-11-23 ENCOUNTER — Telehealth: Payer: Self-pay | Admitting: Cardiovascular Disease

## 2017-11-23 ENCOUNTER — Other Ambulatory Visit: Payer: Self-pay | Admitting: *Deleted

## 2017-11-23 ENCOUNTER — Ambulatory Visit: Payer: Self-pay | Admitting: Cardiovascular Disease

## 2017-11-23 ENCOUNTER — Encounter: Payer: Self-pay | Admitting: *Deleted

## 2017-11-23 NOTE — Patient Outreach (Addendum)
[name]) Care Management  11/23/2017  Daniel Black 1961/02/15 [id number]   Subjective: Telephone call to patient's home / mobile number, spoke with patient, and HIPAA verified.  Discussed [facility] Care Management [facility]MR Transition of care follow up, patient voiced understanding, and is in agreement to follow up.    Patient states he is doing much better, has a follow up appointment with new primary MD on 11/24/17, and cardiologist on 11/30/17.    States he is changing to a new primary MD at [facility] and does not have the MD's name available at this time.   Patient states he is able to manage self care and has assistance as needed.   Patient voices understanding of medical diagnosis and treatment plan.  States he is accessing the following [facility] benefits: outpatient pharmacy, hospital indemnity (not a chosen benefit),  and has started family medical leave act [employer]) claim process.  Patient is planning to obtain a copy of all of his FMLA paperwork and follow as needed. Patient is planning to follow up with [facility] Benefit Specialist or [place] regarding his short term disability questions.   Patient verbally given the following contact numbers per his request: [place] Specialist  [phone]) and [facility]  [phone]).  Patient states he does not have any education material, transition of care, care coordination, disease management, disease monitoring, transportation, community resource, or pharmacy needs at this time.  States he is very appreciative of the follow up and is in agreement to receive [place] Management information.     Objective: Per KPN (Knowledge Performance Now, point of care tool) and chart review, patient hospitalized 11/16/17 -11/20/17 for  Acute ST elevation myocardial infarction (STEMI) of inferior wall.   Patient also has a history of Ventricular tachycardia post myocardial infarction (MI),  hyperlipidemia, Human immunodeficiency virus (HIV) disease , CAD, and AKI (acute kidney injury).        Assessment: Received [facility]MR Transition of care referral on 11/17/17.   Transition of care follow up completed, no care management needs, and will proceed with case closure.      Plan: RNCM will send patient successful outreach letter, [facility] pamphlet, and magnet. RNCM will complete case closure due to follow up completed / no care management needs.       [place] H. Annia Friendly, BSN, [place] Management [facility] Telephonic CM Phone: [phone] Fax: [phone]

## 2017-11-23 NOTE — Telephone Encounter (Signed)
Received FMLA paperwork from [employer] Sent to [employer] through interoffice mail

## 2017-11-24 ENCOUNTER — Other Ambulatory Visit: Payer: Self-pay | Admitting: *Deleted

## 2017-11-24 ENCOUNTER — Other Ambulatory Visit
Admission: RE | Admit: 2017-11-24 | Discharge: 2017-11-24 | Disposition: A | Discharge status: Home / Self Care | Payer: 59 | Source: Ambulatory Visit | Attending: Physician Assistant | Admitting: Physician Assistant

## 2017-11-24 DIAGNOSIS — N179 Acute kidney failure, unspecified: Secondary | ICD-10-CM | POA: Diagnosis not present

## 2017-11-24 DIAGNOSIS — I2510 Atherosclerotic heart disease of native coronary artery without angina pectoris: Secondary | ICD-10-CM | POA: Insufficient documentation

## 2017-11-24 DIAGNOSIS — I2119 ST elevation (STEMI) myocardial infarction involving other coronary artery of inferior wall: Secondary | ICD-10-CM

## 2017-11-24 DIAGNOSIS — E785 Hyperlipidemia, unspecified: Secondary | ICD-10-CM | POA: Diagnosis not present

## 2017-11-24 LAB — BASIC METABOLIC PANEL
Anion gap: 8 (ref 5–15)
BUN: 26 mg/dL — AB (ref 6–20)
CHLORIDE: 102 mmol/L (ref 98–111)
CO2: 30 mmol/L (ref 22–32)
Calcium: 9.1 mg/dL (ref 8.9–10.3)
Creatinine, Ser: 1.55 mg/dL — ABNORMAL HIGH (ref 0.61–1.24)
GFR calc Af Amer: 56 mL/min — ABNORMAL LOW (ref >60)
GFR calc non Af Amer: 48 mL/min — ABNORMAL LOW (ref >60)
Glucose, Bld: 87 mg/dL (ref 70–99)
POTASSIUM: 4.3 mmol/L (ref 3.5–5.1)
SODIUM: 140 mmol/L (ref 135–145)

## 2017-11-27 NOTE — Progress Notes (Signed)
Cardiology Office Note  Date:  11/30/2017   ID:  BLADE SCHEFF, DOB December 09, 1960, MRN [id number]  PCP:  Patient, No Pcp Per   Chief Complaint  Patient presents with  . Other    recent hospital follow up 11/16/2017 for STEMI. Patient c/o Chest pain in the center of chest. Meds reviewed verbally with patient.     HPI:  Mr. Costlow is a very pleasant 57 year old gentleman with  remote smoking history,  coronary artery disease,  MI in 2004 with PCI to the LAD and mid RCA at that time,  PCI in 2010 of the mid RCA lesion,  hyperlipidemia, HIV who is on antivirals   who presents for routine follow up of his coronary artery disease.  Recent STEMI 11/2017 Stent to the distal RCA and posterior AV groove segment Status post staged PCI to the LCx  11/19/2017., second stent placed for suspected edge dissection   Left Main  The vessel exhibits minimal luminal irregularities.  Left Anterior Descending  Prox LAD to Mid LAD lesion 10% stenosed  Prox LAD to Mid LAD lesion is 10% stenosed. The lesion was previously treated.  Mid LAD lesion 30% stenosed  Mid LAD lesion is 30% stenosed.  Left Circumflex  Prox Cx to Mid Cx lesion 95% stenosed  Prox Cx to Mid Cx lesion is 95% stenosed.       **STENT RESOLUTE ONYX [id number] placed**   Right Coronary Artery  Prox RCA to Mid RCA lesion 20% stenosed  Prox RCA to Mid RCA lesion is 20% stenosed. The lesion was previously treatedover 2 years ago.  Mid RCA lesion 30% stenosed  Mid RCA lesion is 30% stenosed.  Mid RCA to Dist RCA lesion 30% stenosed  Mid RCA to Dist RCA lesion is 30% stenosed. The lesion was previously treatedover 2 years ago.  Dist RCA lesion 0% stenosed  Previously placed Dist RCA drug eluting stent is widely patent.  Right Posterior Descending Artery  Ost RPDA to RPDA lesion 95% stenosed  Ost RPDA to RPDA lesion is 95% stenosed.  Right Posterior Atrioventricular Branch  Post Atrio-1 lesion 0% stenosed  Previously placed Post  Atrio-1 drug eluting stent is widely patent. Vessel is the culprit lesion. The lesion is type C.  Post Atrio-2 lesion 100% stenosed  Post Atrio-2 lesion is 100% stenosed.    Recent catheterization results reviewed with him He has started cardiac rehabilitation Occasional chest tightness  previously was noncompliant with his Lipitor Now taking Lipitor and Zetia faithfully with aspirin and brilinta  Weight stable  EKG personally reviewed by myself on todays visit Shows normal sinus rhythm rate 55 bpm no significant ST abnormality he has T-wave inversions inferior leads  Other past medical history  Cardiac catheterization report from April 2010 details patent LAD stent, 30% distal LAD, 25% proximal left circumflex, 20% mid circumflex, 95% mid RCA, 40% distal RCA, 30% right PDA. Xience 3.5 x 28 mm DES stent placed to the mid RCA.     PMH:   has a past medical history of Asymptomatic HIV infection ([place]), Bell's palsy (2004), CAD (coronary artery disease), DDD (degenerative disc disease) (10/02), HLD (hyperlipidemia), Myocardial infarction ([place]) (2004), Nephrolithiasis, and Skin cancer, basal cell.  PSH:    Past Surgical History:  Procedure Laterality Date  . AMPUTATION  1985   L fingers reattachement   . CARDIAC CATHETERIZATION     x2. 2004, 2010. 3 stents(total)  . CHOLECYSTECTOMY  3/03  . COLONOSCOPY WITH PROPOFOL N/A 06/13/2016  Procedure: COLONOSCOPY WITH PROPOFOL;  Surgeon: Lucilla Lame, MD;  Location: [facility];  Service: Endoscopy;  Laterality: N/A;  . CORONARY/GRAFT ACUTE MI REVASCULARIZATION N/A 11/16/2017   Procedure: Coronary/Graft Acute MI Revascularization;  Surgeon: [name], MD;  Location: [facility];  Service: Cardiovascular;  Laterality: N/A;  . LEFT HEART CATH AND CORONARY ANGIOGRAPHY N/A 11/16/2017   Procedure: LEFT HEART CATH AND CORONARY ANGIOGRAPHY;  Surgeon: [name], MD;  Location: [facility];  Service:  Cardiovascular;  Laterality: N/A;  . LEFT HEART CATH AND CORONARY ANGIOGRAPHY N/A 11/19/2017   Procedure: LEFT HEART CATH AND CORONARY ANGIOGRAPHY;  Surgeon: [name], MD;  Location: [facility];  Service: Cardiovascular;  Laterality: N/A;  . POLYPECTOMY  06/13/2016   Procedure: POLYPECTOMY;  Surgeon: Lucilla Lame, MD;  Location: [facility];  Service: Endoscopy;;  . WRIST SURGERY  10/2011    Current Outpatient Medications  Medication Sig Dispense Refill  . aspirin 81 MG chewable tablet Chew 1 tablet (81 mg total) by mouth daily.    . bictegravir-emtricitabine-tenofovir AF (BIKTARVY) 50-200-25 MG TABS tablet Take 1 tablet by mouth daily. 30 tablet 11  . ezetimibe (ZETIA) 10 MG tablet Take 1 tablet (10 mg total) by mouth daily. 90 tablet 3  . Multiple Vitamin (MULTIVITAMIN) tablet Take 1 tablet by mouth daily.    . rosuvastatin (CRESTOR) 40 MG tablet Take 1 tablet (40 mg total) by mouth daily. 90 tablet 3  . ticagrelor (BRILINTA) 90 MG TABS tablet Take 1 tablet (90 mg total) by mouth 2 (two) times daily. 180 tablet 3   No current facility-administered medications for this visit.      Allergies:   Patient has no known allergies.   Social History:  The patient  reports that he quit smoking about 7 years ago. He has never used smokeless tobacco. He reports that he does not drink alcohol or use drugs.   Family History:   family history includes Coronary artery disease in his unknown relative; Heart disease in his father.    Review of Systems: Review of Systems  Constitutional: Negative.   Respiratory: Negative.   Cardiovascular: Negative.   Gastrointestinal: Negative.   Musculoskeletal: Negative.   Neurological: Negative.   Psychiatric/Behavioral: Negative.   All other systems reviewed and are negative.    PHYSICAL EXAM: VS:  BP 116/78   Pulse (!) 55   Ht 5\' 10"  (1.778 m)   Wt 178 lb (80.7 kg)   BMI 25.54 kg/m  , BMI Body mass index is 25.54 kg/m.  No  changes to physical exam GEN: Well nourished, well developed, in no acute distress  HEENT: normal  Neck: no JVD, carotid bruits, or masses Cardiac: RRR; no murmurs, rubs, or gallops,no edema  Respiratory:  clear to auscultation bilaterally, normal work of breathing GI: soft, nontender, nondistended, + BS MS: no deformity or atrophy  Skin: warm and dry, no rash Neuro:  Strength and sensation are intact Psych: euthymic mood, full affect   Recent Labs: 11/16/2017: ALT 22 11/20/2017: Hemoglobin 14.3; Magnesium 1.9; Platelets 199; TSH 4.546 11/24/2017: BUN 26; Creatinine, Ser 1.55; Potassium 4.3; Sodium 140    Lipid Panel Lab Results  Component Value Date   CHOL 135 11/17/2017   HDL 42 11/17/2017   LDLCALC 82 11/17/2017   TRIG 54 11/17/2017      Wt Readings from Last 3 Encounters:  11/30/17 178 lb (80.7 kg)  11/18/17 173 lb 14.4 oz (78.9 kg)  11/16/17 175 lb (79.4 kg)       ASSESSMENT AND PLAN:  Atherosclerosis of native coronary artery without angina pectoris, unspecified whether native or transplanted heart -  Recent STEMI with VT Stent stent to distal RCA and mid left circumflex No medication changes made Out of work for cardiac rehabilitation Occasional chest discomfort on exertion Will likely be able to return to work end of September  Mixed hyperlipidemia Continue Crestor and Zetia  Human immunodeficiency virus (HIV) disease ([place]) Stable, on antivirals Managed by ID    Total encounter time more than 25 minutes  Greater than 50% was spent in counseling and coordination of care with the patient   Disposition:   F/U  12 months   Orders Placed This Encounter  Procedures  . EKG 12-Lead     Signed, Esmond Plants, M.D., Ph.D. 11/30/2017  [place], [place]

## 2017-11-30 ENCOUNTER — Ambulatory Visit: Payer: 59 | Admitting: Cardiovascular Disease

## 2017-11-30 ENCOUNTER — Encounter: Payer: Self-pay | Admitting: Cardiovascular Disease

## 2017-11-30 VITALS — BP 116/78 | HR 55 | Ht 70.0 in | Wt 178.0 lb

## 2017-11-30 DIAGNOSIS — I25118 Atherosclerotic heart disease of native coronary artery with other forms of angina pectoris: Secondary | ICD-10-CM

## 2017-11-30 DIAGNOSIS — E782 Mixed hyperlipidemia: Secondary | ICD-10-CM

## 2017-11-30 DIAGNOSIS — B20 Human immunodeficiency virus [HIV] disease: Secondary | ICD-10-CM

## 2017-11-30 DIAGNOSIS — N179 Acute kidney failure, unspecified: Secondary | ICD-10-CM

## 2017-11-30 DIAGNOSIS — I2119 ST elevation (STEMI) myocardial infarction involving other coronary artery of inferior wall: Secondary | ICD-10-CM | POA: Diagnosis not present

## 2017-11-30 MED ORDER — TICAGRELOR 90 MG PO TABS: 90.0000 mg | ORAL_TABLET | Freq: Two times a day (BID) | ORAL | 3 refills | Status: DC

## 2017-11-30 MED ORDER — ROSUVASTATIN CALCIUM 40 MG PO TABS: 40.0000 mg | ORAL_TABLET | Freq: Every day | ORAL | 3 refills | Status: DC

## 2017-11-30 MED ORDER — EZETIMIBE 10 MG PO TABS: 10.0000 mg | ORAL_TABLET | Freq: Every day | ORAL | 3 refills | Status: DC

## 2017-11-30 NOTE — Telephone Encounter (Signed)
[employer] forms received from Nurse sent paperwork to [facility] for completion

## 2017-11-30 NOTE — Telephone Encounter (Signed)
Received paperwork from [facility] in nurses bin for completion

## 2017-11-30 NOTE — Patient Instructions (Signed)

## 2017-11-30 NOTE — Telephone Encounter (Signed)
[employer] forms completed and given to [facility] for processing.

## 2017-12-08 MED FILL — BIKTARVY 50-200-25 MG TABS: 50-200-25 | 30 days supply | Qty: 30 | Fill #8

## 2017-12-29 ENCOUNTER — Telehealth: Payer: Self-pay | Admitting: Cardiovascular Disease

## 2017-12-29 DIAGNOSIS — N289 Disorder of kidney and ureter, unspecified: Secondary | ICD-10-CM

## 2017-12-29 NOTE — Telephone Encounter (Signed)
Patient wants a referral to see Dr. Holley Raring with [facility] because he is having kidney issue

## 2017-12-29 NOTE — Telephone Encounter (Signed)
Reviewed form with Dr. Rockey Situ and he wants to hold off on this for right now to discuss with him. Will place form above my desk in the "Save" bin.

## 2017-12-29 NOTE — Telephone Encounter (Signed)
S/w patient.  He says he was told by our service while in the hospital that he needed referral to kidney doctor.  He last saw Dr Rockey Situ 11/30/17.  I encouraged patient to reach out to his PCP for the referral. Patient states he does not have a PCP at this moment.  We discussed the importance of establishing with a PCP for routine care. He said most offices are not taking new patients. I encouraged patient to still reach out and go ahead and schedule even if the appointment is a few weeks/months away. He verbalized understanding. Advised I will ask Dr Rockey Situ about the referral to Dr Holley Raring.

## 2017-12-29 NOTE — Telephone Encounter (Signed)
Recieved request from [employer] for return to work release   Placed in Advertising copywriter.

## 2017-12-29 NOTE — Telephone Encounter (Addendum)
Form given to Dr Donivan Scull nurse.

## 2017-12-29 NOTE — Telephone Encounter (Signed)
Received form for patients return to work. Placed on Dr. Donivan Scull desk for his review.

## 2017-12-31 NOTE — Telephone Encounter (Signed)
We can send in a referral if he would like May have been secondary to dehydration  Perhaps before referral would retest BMP Prior to lab draw would hydrate aggressively Need a new baseline

## 2018-01-01 NOTE — Telephone Encounter (Signed)
Will review with Dr. Rockey Situ this afternoon.

## 2018-01-01 NOTE — Telephone Encounter (Signed)
Patient calling would like a call back about an update on him being able to return to work  Please call back

## 2018-01-04 NOTE — Telephone Encounter (Signed)
I called and spoke with the patient per Dr. Donivan Scull request. I advised him that Dr. Rockey Situ has said he can go back to work, but he will need to be put on some type of restrictions.  He wanted me to call and find out what the patient is doing at work. 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 His most streuous activities would be bathing/ turning patient's (usually with the assistance of a nurse), changing sheets, and sometimes he will need to do chest compressions during a code.  I advised him that I will review this information with Dr. Rockey Situ and we will call him back when his forms are ready. The patient voices understanding and is agreeable.

## 2018-01-04 NOTE — Telephone Encounter (Signed)
I called and spoke with the patient. He is aware of Dr. Donivan Scull recommendations that we can place a referral to renal, but he would first like the patient to have a repeat BMP done.  The patient is agreeable. I have advised him to hydrate aggressively prior to his lab draw.  The patient voices understanding. He would like to have his lab done at the [facility] lab at [facility].  Order has been placed.

## 2018-01-05 ENCOUNTER — Other Ambulatory Visit
Admission: RE | Admit: 2018-01-05 | Discharge: 2018-01-05 | Disposition: A | Discharge status: Home / Self Care | Payer: 59 | Source: Ambulatory Visit | Attending: Cardiovascular Disease | Admitting: Cardiovascular Disease

## 2018-01-05 DIAGNOSIS — N289 Disorder of kidney and ureter, unspecified: Secondary | ICD-10-CM | POA: Diagnosis not present

## 2018-01-05 LAB — BASIC METABOLIC PANEL
ANION GAP: 9 (ref 5–15)
BUN: 20 mg/dL (ref 6–20)
CHLORIDE: 103 mmol/L (ref 98–111)
CO2: 27 mmol/L (ref 22–32)
Calcium: 9.2 mg/dL (ref 8.9–10.3)
Creatinine, Ser: 1.56 mg/dL — ABNORMAL HIGH (ref 0.61–1.24)
GFR calc Af Amer: 55 mL/min — ABNORMAL LOW (ref >60)
GFR, EST NON AFRICAN AMERICAN: 48 mL/min — AB (ref >60)
Glucose, Bld: 102 mg/dL — ABNORMAL HIGH (ref 70–99)
POTASSIUM: 3.8 mmol/L (ref 3.5–5.1)
SODIUM: 139 mmol/L (ref 135–145)

## 2018-01-05 NOTE — Telephone Encounter (Signed)
Duplicate telephone encounters regarding paperwork and labs. Reviewed all information with patient and he verbalized understanding with no further questions.

## 2018-01-05 NOTE — Telephone Encounter (Signed)
Spoke with patient and reviewed labs that are pending review by provider. Placed referral for [facility] with Dr. Holley Raring per his request and also reviewed that his paperwork was completed and ready for him to pick up. He was appreciative for the information with no further questions at this time.

## 2018-01-12 MED FILL — BIKTARVY 50-200-25 MG TABS: 50-200-25 | 30 days supply | Qty: 30 | Fill #9

## 2018-01-18 DIAGNOSIS — N183 Chronic kidney disease, stage 3 (moderate): Secondary | ICD-10-CM | POA: Diagnosis not present

## 2018-01-18 DIAGNOSIS — E7800 Pure hypercholesterolemia, unspecified: Secondary | ICD-10-CM | POA: Diagnosis not present

## 2018-01-18 DIAGNOSIS — B20 Human immunodeficiency virus [HIV] disease: Secondary | ICD-10-CM | POA: Diagnosis not present

## 2018-01-20 ENCOUNTER — Other Ambulatory Visit: Payer: Self-pay | Admitting: Nephrology

## 2018-01-20 DIAGNOSIS — N183 Chronic kidney disease, stage 3 unspecified: Secondary | ICD-10-CM

## 2018-01-20 DIAGNOSIS — H524 Presbyopia: Secondary | ICD-10-CM | POA: Diagnosis not present

## 2018-01-25 ENCOUNTER — Ambulatory Visit
Admission: RE | Admit: 2018-01-25 | Discharge: 2018-01-25 | Disposition: A | Discharge status: Home / Self Care | Payer: 59 | Source: Ambulatory Visit | Attending: Nephrology | Admitting: Nephrology

## 2018-01-25 DIAGNOSIS — N281 Cyst of kidney, acquired: Secondary | ICD-10-CM | POA: Diagnosis not present

## 2018-01-25 DIAGNOSIS — N183 Chronic kidney disease, stage 3 unspecified: Secondary | ICD-10-CM

## 2018-02-15 MED FILL — BIKTARVY 50-200-25 MG TABS: 50-200-25 | 30 days supply | Qty: 30 | Fill #10

## 2018-02-22 DIAGNOSIS — N183 Chronic kidney disease, stage 3 (moderate): Secondary | ICD-10-CM | POA: Diagnosis not present

## 2018-02-22 DIAGNOSIS — B20 Human immunodeficiency virus [HIV] disease: Secondary | ICD-10-CM | POA: Diagnosis not present

## 2018-03-15 MED FILL — BIKTARVY 50-200-25 MG TABS: 50-200-25 | 30 days supply | Qty: 30 | Fill #11

## 2018-03-19 DIAGNOSIS — N183 Chronic kidney disease, stage 3 (moderate): Secondary | ICD-10-CM | POA: Diagnosis not present

## 2018-03-25 DIAGNOSIS — Z86018 Personal history of other benign neoplasm: Secondary | ICD-10-CM | POA: Diagnosis not present

## 2018-03-25 DIAGNOSIS — L570 Actinic keratosis: Secondary | ICD-10-CM | POA: Diagnosis not present

## 2018-03-25 DIAGNOSIS — L578 Other skin changes due to chronic exposure to nonionizing radiation: Secondary | ICD-10-CM | POA: Diagnosis not present

## 2018-03-25 DIAGNOSIS — C44519 Basal cell carcinoma of skin of other part of trunk: Secondary | ICD-10-CM | POA: Diagnosis not present

## 2018-03-25 DIAGNOSIS — C44219 Basal cell carcinoma of skin of left ear and external auricular canal: Secondary | ICD-10-CM | POA: Diagnosis not present

## 2018-03-25 DIAGNOSIS — Z1283 Encounter for screening for malignant neoplasm of skin: Secondary | ICD-10-CM | POA: Diagnosis not present

## 2018-03-25 DIAGNOSIS — D485 Neoplasm of uncertain behavior of skin: Secondary | ICD-10-CM | POA: Diagnosis not present

## 2018-03-25 DIAGNOSIS — Z85828 Personal history of other malignant neoplasm of skin: Secondary | ICD-10-CM | POA: Diagnosis not present

## 2018-03-25 DIAGNOSIS — C44612 Basal cell carcinoma of skin of right upper limb, including shoulder: Secondary | ICD-10-CM | POA: Diagnosis not present

## 2018-04-12 ENCOUNTER — Other Ambulatory Visit: Payer: Self-pay | Admitting: Pharmacist

## 2018-04-12 MED ORDER — BICTEGRAVIR-EMTRICITAB-TENOFOV 50-200-25 MG PO TABS: 1.0000 | ORAL_TABLET | Freq: Every day | ORAL | 0 refills | Status: DC

## 2018-04-12 MED FILL — BIKTARVY 50-200-25 MG TABS: 50-200-25 | 30 days supply | Qty: 30 | Fill #0

## 2018-04-29 ENCOUNTER — Encounter: Payer: Self-pay | Admitting: Family Medicine

## 2018-04-29 ENCOUNTER — Ambulatory Visit: Payer: 59 | Admitting: Family Medicine

## 2018-04-29 VITALS — BP 120/62 | HR 100 | Ht 70.0 in | Wt 186.0 lb

## 2018-04-29 DIAGNOSIS — B20 Human immunodeficiency virus [HIV] disease: Secondary | ICD-10-CM

## 2018-04-29 DIAGNOSIS — Z7689 Persons encountering health services in other specified circumstances: Secondary | ICD-10-CM | POA: Diagnosis not present

## 2018-04-29 NOTE — Progress Notes (Signed)
Date:  04/29/2018   Name:  Daniel Black   DOB:  01/03/1961   MRN:  [id number]   Chief Complaint: Establish Care (didn't have a pcp- needs referral to infectious disease for Dr Delaine Lame)  Patient is a 58 year old male who presents for an establish care exam. The patient reports the following problems: infection disease . Health maintenance has been reviewed up to date.   Review of Systems  Constitutional: Negative for chills and fever.  HENT: Negative for drooling, ear discharge, ear pain and sore throat.   Respiratory: Negative for cough, shortness of breath and wheezing.   Cardiovascular: Negative for chest pain, palpitations and leg swelling.  Gastrointestinal: Negative for abdominal pain, blood in stool, constipation, diarrhea and nausea.  Endocrine: Negative for polydipsia.  Genitourinary: Negative for dysuria, frequency, hematuria and urgency.  Musculoskeletal: Negative for back pain, myalgias and neck pain.  Skin: Negative for rash.  Allergic/Immunologic: Negative for environmental allergies.  Neurological: Negative for dizziness and headaches.  Hematological: Does not bruise/bleed easily.  Psychiatric/Behavioral: Negative for suicidal ideas. The patient is not nervous/anxious.     Patient Active Problem List   Diagnosis Date Noted  . Acute ST elevation myocardial infarction (STEMI) of inferior wall ([place]) 11/16/2017  . CAD in native artery 11/16/2017  . AKI (acute kidney injury) ([place]) 11/16/2017  . Special screening for malignant neoplasms, colon   . Benign neoplasm of ascending colon   . Human immunodeficiency virus (HIV) disease ([place]) 11/25/2009  . Hyperlipidemia 11/23/2009  . Coronary atherosclerosis 11/23/2009  . Chest pain 11/23/2009    No Known Allergies  Past Surgical History:  Procedure Laterality Date  . AMPUTATION  1985   L fingers reattachement   . back fusion     lumbar  . CARDIAC CATHETERIZATION     x2. 2004, 2010. 3 stents(total)  .  CHOLECYSTECTOMY  3/03  . COLONOSCOPY WITH PROPOFOL N/A 06/13/2016   Procedure: COLONOSCOPY WITH PROPOFOL;  Surgeon: Lucilla Lame, MD;  Location: [facility];  Service: Endoscopy;  Laterality: N/A;  . CORONARY/GRAFT ACUTE MI REVASCULARIZATION N/A 11/16/2017   Procedure: Coronary/Graft Acute MI Revascularization;  Surgeon: [name], MD;  Location: [facility] [place] [facility];  Service: Cardiovascular;  Laterality: N/A;  . LEFT HEART CATH AND CORONARY ANGIOGRAPHY N/A 11/16/2017   Procedure: LEFT HEART CATH AND CORONARY ANGIOGRAPHY;  Surgeon: [name], MD;  Location: [place] [facility];  Service: Cardiovascular;  Laterality: N/A;  . LEFT HEART CATH AND CORONARY ANGIOGRAPHY N/A 11/19/2017   Procedure: LEFT HEART CATH AND CORONARY ANGIOGRAPHY;  Surgeon: [name], MD;  Location: [facility];  Service: Cardiovascular;  Laterality: N/A;  . POLYPECTOMY  06/13/2016   Procedure: POLYPECTOMY;  Surgeon: Lucilla Lame, MD;  Location: [facility];  Service: Endoscopy;;  . WRIST SURGERY  10/2011    Social History   Tobacco Use  . Smoking status: Former Smoker    Last attempt to quit: 04/07/2010    Years since quitting: 8.0  . Smokeless tobacco: Never Used  Substance Use Topics  . Alcohol use: No    Comment: occasional (1x/mo)  . Drug use: No     Medication list has been reviewed and updated.  Current Meds  Medication Sig  . aspirin 81 MG chewable tablet Chew 1 tablet (81 mg total) by mouth daily.  . bictegravir-emtricitabine-tenofovir AF (BIKTARVY) 50-200-25 MG TABS tablet Take 1 tablet by mouth daily.  Marland Kitchen ezetimibe (ZETIA) 10 MG tablet Take  1 tablet (10 mg total) by mouth daily.  . Multiple Vitamin (MULTIVITAMIN) tablet Take 1 tablet by mouth daily.  . rosuvastatin (CRESTOR) 40 MG tablet Take 1 tablet (40 mg total) by mouth daily.  . ticagrelor (BRILINTA) 90 MG TABS tablet Take 1 tablet (90 mg total) by mouth 2 (two) times daily. (Patient taking differently: Take  90 mg by mouth 2 (two) times daily. Dr Rockey Situ)    [facility] 2/9 Scores 04/29/2018  PHQ - 2 Score 0  PHQ- 9 Score 5    Physical Exam  BP 120/62   Pulse 100   Ht 5\' 10"  (1.778 m)   Wt 186 lb (84.4 kg)   BMI 26.69 kg/m   Assessment and Plan: 1. Human immunodeficiency virus (HIV) disease ([place]) Patient has a history of HIV disease.  He was followed by infectious disease but needs a referral to the new infectious disease physician [facility] system.  Will make. - Ambulatory referral to Infectious Disease  2. Establishing care with new doctor, encounter for Patient needs primary care establish for acute concerns And will gradual and active annual physical near future.

## 2018-04-30 ENCOUNTER — Telehealth: Payer: Self-pay | Admitting: Licensed Clinical Social Worker

## 2018-04-30 NOTE — Telephone Encounter (Signed)
Referral faxed from [facility]. I called patient and left a message for him to call back for an appointment.

## 2018-05-12 ENCOUNTER — Encounter: Payer: Self-pay | Admitting: Family Medicine

## 2018-05-12 ENCOUNTER — Ambulatory Visit (INDEPENDENT_AMBULATORY_CARE_PROVIDER_SITE_OTHER): Payer: 59 | Admitting: Family Medicine

## 2018-05-12 VITALS — BP 121/78 | HR 74 | Resp 16 | Ht 70.0 in | Wt 185.0 lb

## 2018-05-12 DIAGNOSIS — R351 Nocturia: Secondary | ICD-10-CM

## 2018-05-12 DIAGNOSIS — Z0000 Encounter for general adult medical examination without abnormal findings: Secondary | ICD-10-CM | POA: Diagnosis not present

## 2018-05-12 NOTE — Progress Notes (Addendum)
Date:  05/12/2018   Name:  Daniel Black   DOB:  09-13-1960   MRN:  [id number]   Chief Complaint: Annual Exam  Patient is a 58 year old male  who presents for a comprehensive physical exam. The patient reports the following problems: none. Health maintenance has been reviewed up to date.   Review of Systems  Constitutional: Negative for chills and fever.  HENT: Positive for congestion. Negative for drooling, ear discharge, ear pain and sore throat.   Respiratory: Negative for cough, shortness of breath and wheezing.   Cardiovascular: Negative for chest pain, palpitations and leg swelling.  Gastrointestinal: Negative for abdominal pain, blood in stool, constipation, diarrhea and nausea.  Endocrine: Negative for polydipsia.  Genitourinary: Negative for dysuria, frequency, hematuria and urgency.  Musculoskeletal: Positive for back pain. Negative for myalgias and neck pain.  Skin: Negative for rash.  Allergic/Immunologic: Negative for environmental allergies.  Neurological: Negative for dizziness and headaches.  Hematological: Does not bruise/bleed easily.  Psychiatric/Behavioral: Negative for suicidal ideas. The patient is not nervous/anxious.     Patient Active Problem List   Diagnosis Date Noted  . Acute ST elevation myocardial infarction (STEMI) of inferior wall ([place]) 11/16/2017  . CAD in native artery 11/16/2017  . AKI (acute kidney injury) ([place]) 11/16/2017  . Special screening for malignant neoplasms, colon   . Benign neoplasm of ascending colon   . Human immunodeficiency virus (HIV) disease ([place]) 11/25/2009  . Hyperlipidemia 11/23/2009  . Coronary atherosclerosis 11/23/2009  . Chest pain 11/23/2009    No Known Allergies  Past Surgical History:  Procedure Laterality Date  . AMPUTATION  1985   L fingers reattachement   . back fusion     lumbar  . CARDIAC CATHETERIZATION     x2. 2004, 2010. 3 stents(total)  . CHOLECYSTECTOMY  3/03  . COLONOSCOPY WITH PROPOFOL  N/A 06/13/2016   Procedure: COLONOSCOPY WITH PROPOFOL;  Surgeon: Lucilla Lame, MD;  Location: [place];  Service: Endoscopy;  Laterality: N/A;  . CORONARY/GRAFT ACUTE MI REVASCULARIZATION N/A 11/16/2017   Procedure: Coronary/Graft Acute MI Revascularization;  Surgeon: [name], MD;  Location: [place] [facility];  Service: Cardiovascular;  Laterality: N/A;  . LEFT HEART CATH AND CORONARY ANGIOGRAPHY N/A 11/16/2017   Procedure: LEFT HEART CATH AND CORONARY ANGIOGRAPHY;  Surgeon: [name], MD;  Location: [place] [facility];  Service: Cardiovascular;  Laterality: N/A;  . LEFT HEART CATH AND CORONARY ANGIOGRAPHY N/A 11/19/2017   Procedure: LEFT HEART CATH AND CORONARY ANGIOGRAPHY;  Surgeon: [name], MD;  Location: [place] [facility];  Service: Cardiovascular;  Laterality: N/A;  . POLYPECTOMY  06/13/2016   Procedure: POLYPECTOMY;  Surgeon: Lucilla Lame, MD;  Location: [place];  Service: Endoscopy;;  . WRIST SURGERY  10/2011    Social History   Tobacco Use  . Smoking status: Former Smoker    Last attempt to quit: 04/07/2010    Years since quitting: 8.1  . Smokeless tobacco: Never Used  Substance Use Topics  . Alcohol use: No    Comment: occasional (1x/mo)  . Drug use: No     Medication list has been reviewed and updated.  Current Meds  Medication Sig  . aspirin 81 MG chewable tablet Chew 1 tablet (81 mg total) by mouth daily.  . bictegravir-emtricitabine-tenofovir AF (BIKTARVY) 50-200-25 MG TABS tablet Take 1 tablet by mouth daily.  Marland Kitchen ezetimibe (ZETIA) 10 MG tablet Take 1 tablet (10 mg total) by mouth daily.  Marland Kitchen  Multiple Vitamin (MULTIVITAMIN) tablet Take 1 tablet by mouth daily.  . rosuvastatin (CRESTOR) 40 MG tablet Take 1 tablet (40 mg total) by mouth daily.  . ticagrelor (BRILINTA) 90 MG TABS tablet Take 1 tablet (90 mg total) by mouth 2 (two) times daily. (Patient taking differently: Take 90 mg by mouth 2 (two) times daily. Dr Rockey Situ)     [facility] 2/9 Scores 05/12/2018 04/29/2018  PHQ - 2 Score 0 0  PHQ- 9 Score 0 5    Physical Exam Constitutional:      Appearance: He is normal weight.  HENT:     Head: Normocephalic and atraumatic.     Jaw: There is normal jaw occlusion.     Right Ear: Tympanic membrane, ear canal and external ear normal.     Left Ear: Tympanic membrane, ear canal and external ear normal.     Nose: Nose normal. No congestion or rhinorrhea.     Mouth/Throat:     Mouth: Mucous membranes are moist.     Pharynx: Oropharynx is clear. No oropharyngeal exudate or posterior oropharyngeal erythema.  Eyes:     General: Lids are normal. No scleral icterus.       Right eye: No foreign body, discharge or hordeolum.        Left eye: No foreign body, discharge or hordeolum.     Extraocular Movements: Extraocular movements intact.     Conjunctiva/sclera: Conjunctivae normal.     Pupils: Pupils are equal, round, and reactive to light.  Neck:     Musculoskeletal: Normal range of motion and neck supple. No neck rigidity or muscular tenderness.     Thyroid: No thyroid mass, thyromegaly or thyroid tenderness.     Vascular: No carotid bruit or JVD.     Trachea: No tracheal deviation.  Cardiovascular:     Rate and Rhythm: Normal rate and regular rhythm.     Pulses: Normal pulses. No decreased pulses.          Carotid pulses are 2+ on the right side and 2+ on the left side.      Radial pulses are 2+ on the right side and 2+ on the left side.       Femoral pulses are 2+ on the right side and 2+ on the left side.      Popliteal pulses are 2+ on the right side and 2+ on the left side.       Dorsalis pedis pulses are 2+ on the right side and 2+ on the left side.       Posterior tibial pulses are 2+ on the right side and 2+ on the left side.     Heart sounds: Normal heart sounds, S1 normal and S2 normal. No murmur. No systolic murmur. No diastolic murmur. No friction rub. No gallop. No S3 or S4 sounds.   Pulmonary:      Effort: No respiratory distress.     Breath sounds: Normal breath sounds. No decreased breath sounds, wheezing, rhonchi or rales.  Chest:     Chest wall: No mass.     Breasts:        Right: Normal.        Left: Normal.  Abdominal:     General: Bowel sounds are normal. There is no distension.     Palpations: Abdomen is soft. There is no hepatomegaly, splenomegaly or mass.     Tenderness: There is no abdominal tenderness. There is no right CVA tenderness, left CVA tenderness, guarding or rebound.  Hernia: No hernia is present.  Genitourinary:    Penis: Normal.      Scrotum/Testes: Normal.     Epididymis:     Right: Normal.     Left: Normal.  Musculoskeletal: Normal range of motion.        General: No tenderness.     Right lower leg: No edema.     Left lower leg: No edema.  Lymphadenopathy:     Head:     Right side of head: No submental or submandibular adenopathy.     Left side of head: No submental or submandibular adenopathy.     Cervical: No cervical adenopathy.  Skin:    General: Skin is warm.     Findings: No rash.  Neurological:     Mental Status: He is alert and oriented to person, place, and time.     Cranial Nerves: Cranial nerves are intact. No cranial nerve deficit.     Sensory: Sensation is intact.     Motor: Motor function is intact.     Deep Tendon Reflexes: Reflexes are normal and symmetric.     Reflex Scores:      Tricep reflexes are 2+ on the right side and 2+ on the left side.      Bicep reflexes are 2+ on the right side and 2+ on the left side.      Brachioradialis reflexes are 2+ on the right side and 2+ on the left side.      Patellar reflexes are 2+ on the right side and 2+ on the left side.      Achilles reflexes are 2+ on the right side and 2+ on the left side.    BP 121/78   Pulse 74   Resp 16   Ht 5\' 10"  (1.778 m)   Wt 185 lb (83.9 kg)   SpO2 98%   BMI 26.54 kg/m   Assessment and Plan:  1. Encounter for annual physical exam No  subjective/objective concerns noted during history and physical exam.  His past medical history was reviewed as well as medications and recent labs.  Obtain renal function panel lipid panel and CBC.Daniel Black is a 58 y.o. male who presents today for his Complete Annual Exam. He feels well. He reports exercising . He reports he is sleeping well. Immunizations are reviewed and recommendations provided.   Age appropriate screening tests are discussed. Counseling given for risk factor reduction interventions. - Renal Function Panel - Lipid panel - CBC with Differential/Platelet  2. Nocturia Patient has a history of nocturia will evaluate with DRE and PSA. - PSA

## 2018-05-13 ENCOUNTER — Other Ambulatory Visit
Admission: RE | Admit: 2018-05-13 | Discharge: 2018-05-13 | Disposition: A | Discharge status: Home / Self Care | Payer: 59 | Source: Ambulatory Visit | Attending: Infectious Diseases | Admitting: Infectious Diseases

## 2018-05-13 ENCOUNTER — Ambulatory Visit: Payer: Commercial Managed Care - PPO | Attending: Infectious Diseases | Admitting: Infectious Diseases

## 2018-05-13 ENCOUNTER — Encounter: Payer: Self-pay | Admitting: Infectious Diseases

## 2018-05-13 VITALS — BP 116/75 | HR 104 | Temp 97.5°F | Ht 70.0 in | Wt 185.0 lb

## 2018-05-13 DIAGNOSIS — Z955 Presence of coronary angioplasty implant and graft: Secondary | ICD-10-CM | POA: Diagnosis not present

## 2018-05-13 DIAGNOSIS — B20 Human immunodeficiency virus [HIV] disease: Secondary | ICD-10-CM | POA: Diagnosis not present

## 2018-05-13 DIAGNOSIS — I2510 Atherosclerotic heart disease of native coronary artery without angina pectoris: Secondary | ICD-10-CM

## 2018-05-13 DIAGNOSIS — R7989 Other specified abnormal findings of blood chemistry: Secondary | ICD-10-CM

## 2018-05-13 DIAGNOSIS — Z79899 Other long term (current) drug therapy: Secondary | ICD-10-CM

## 2018-05-13 DIAGNOSIS — I252 Old myocardial infarction: Secondary | ICD-10-CM | POA: Diagnosis not present

## 2018-05-13 DIAGNOSIS — Z7982 Long term (current) use of aspirin: Secondary | ICD-10-CM

## 2018-05-13 DIAGNOSIS — Z87891 Personal history of nicotine dependence: Secondary | ICD-10-CM

## 2018-05-13 LAB — CBC WITH DIFFERENTIAL/PLATELET
Basophils Absolute: 0 10*3/uL (ref 0.0–0.2)
Basos: 1 %
EOS (ABSOLUTE): 0.3 10*3/uL (ref 0.0–0.4)
Eos: 4 %
Hematocrit: 45.8 % (ref 37.5–51.0)
Hemoglobin: 15.5 g/dL (ref 13.0–17.7)
IMMATURE GRANS (ABS): 0 10*3/uL (ref 0.0–0.1)
Immature Granulocytes: 0 %
Lymphocytes Absolute: 1.4 10*3/uL (ref 0.7–3.1)
Lymphs: 23 %
MCH: 32.1 pg (ref 26.6–33.0)
MCHC: 33.8 g/dL (ref 31.5–35.7)
MCV: 95 fL (ref 79–97)
Monocytes Absolute: 0.6 10*3/uL (ref 0.1–0.9)
Monocytes: 10 %
Neutrophils Absolute: 3.6 10*3/uL (ref 1.4–7.0)
Neutrophils: 62 %
Platelets: 225 10*3/uL (ref 150–450)
RBC: 4.83 x10E6/uL (ref 4.14–5.80)
RDW: 12.3 % (ref 11.6–15.4)
WBC: 5.9 10*3/uL (ref 3.4–10.8)

## 2018-05-13 LAB — LIPID PANEL
Chol/HDL Ratio: 2.3 ratio (ref 0.0–5.0)
Cholesterol, Total: 91 mg/dL — ABNORMAL LOW (ref 100–199)
HDL: 40 mg/dL (ref >39)
LDL Calculated: 38 mg/dL (ref 0–99)
Triglycerides: 66 mg/dL (ref 0–149)
VLDL Cholesterol Cal: 13 mg/dL (ref 5–40)

## 2018-05-13 LAB — RENAL FUNCTION PANEL
Albumin: 4.6 g/dL (ref 3.8–4.9)
BUN/Creatinine Ratio: 12 (ref 9–20)
BUN: 16 mg/dL (ref 6–24)
CO2: 22 mmol/L (ref 20–29)
Calcium: 9.2 mg/dL (ref 8.7–10.2)
Chloride: 105 mmol/L (ref 96–106)
Creatinine, Ser: 1.3 mg/dL — ABNORMAL HIGH (ref 0.76–1.27)
GFR calc Af Amer: 70 mL/min/{1.73_m2} (ref >59)
GFR calc non Af Amer: 61 mL/min/{1.73_m2} (ref >59)
Glucose: 90 mg/dL (ref 65–99)
Phosphorus: 3.1 mg/dL (ref 2.8–4.1)
Potassium: 4.5 mmol/L (ref 3.5–5.2)
Sodium: 143 mmol/L (ref 134–144)

## 2018-05-13 LAB — PSA: Prostate Specific Ag, Serum: 1.8 ng/mL (ref 0.0–4.0)

## 2018-05-13 MED ORDER — BICTEGRAVIR-EMTRICITAB-TENOFOV 50-200-25 MG PO TABS: 1.0000 | ORAL_TABLET | Freq: Every day | ORAL | 5 refills | Status: DC

## 2018-05-13 MED FILL — BIKTARVY 50-200-25 MG TABS: 50-200-25 | 30 days supply | Qty: 30 | Fill #0

## 2018-05-13 NOTE — Patient Instructions (Signed)
You are here to engage in HIV care- You are on [place]- will do labs today Need to get your Vaccination status updated

## 2018-05-13 NOTE — Progress Notes (Signed)
NAME: Daniel Black  DOB: 05/13/1960  MRN: [id number]  Date/Time: 05/13/2018 12:36 PM Subjective:  REASON FOR CONSULT:  ? Daniel Black is a 58 y.o. with a history of AIDS, CAD, STEMI s/p stents is transferring his HIV care to me. He was followed by Dr.Fitzgerald until June 2019. He is on Biktarvy and 100% adherent. . No side effects Last VL < 20 on 07/29/17 616 (49%) on Jan 2019  HIV diagnosed 1984 Nadir Cd4 70  OI- none HAARt history AZT And other drugs atripla Biktarvy  Acquired thru sex with men Genotype  Aug 2019 had STEMI   Aug 12 th  2019 STEMI  Dist RCA lesion is 85% stenosed.  A drug-eluting stent was successfully placed using a STENT RESOLUTE ONYX 3.5X12.  Ost RPDA to RPDA lesion is 95% stenosed. A drug-eluting stent was successfully placed using a STENT RESOLUTE ONYX 2.5X15. Had Vfib and was shocked? 8/15 Successful angioplasty and drug-eluting stent placement to the mid left circumflex.  A second stent was overlapped proximally due to suspected edge dissection that was covered successfully with a second stent.   PMH Bell's palsy  . Degenerative disc disease  . Mixed hyperlipidemia  . Nephronophthisis  . Old myocardial infarction  Low testosterone  Past Surgical History:   . CHOLECYSTECTOMY  . FINGER  Left reattachment Colonoscopy with poly removal . LUMBAR FUSION   SH Former smoker Works in the ICU Lives with male partner who is HIV negative    Family History  Problem Relation Age of Onset  . Heart disease Father   . Kidney disease Father   . Cancer Father   . Hearing loss Father   . Hypertension Father   . Stroke Father   . Vision loss Father   . Coronary artery disease Father   . Varicose Veins Father   . Hypertension Mother   . Hearing loss Mother   . Heart disease Mother   . Coronary artery disease Other        family hx   No Known Allergies ? Current Outpatient Medications  Medication Sig Dispense Refill  . aspirin 81 MG  chewable tablet Chew 1 tablet (81 mg total) by mouth daily.    . bictegravir-emtricitabine-tenofovir AF (BIKTARVY) 50-200-25 MG TABS tablet Take 1 tablet by mouth daily. 30 tablet 0  . ezetimibe (ZETIA) 10 MG tablet Take 1 tablet (10 mg total) by mouth daily. 90 tablet 3  . Multiple Vitamin (MULTIVITAMIN) tablet Take 1 tablet by mouth daily.    . rosuvastatin (CRESTOR) 40 MG tablet Take 1 tablet (40 mg total) by mouth daily. 90 tablet 3  . ticagrelor (BRILINTA) 90 MG TABS tablet Take 1 tablet (90 mg total) by mouth 2 (two) times daily. (Patient taking differently: Take 90 mg by mouth 2 (two) times daily. Dr Rockey Situ) 180 tablet 3   No current facility-administered medications for this visit.     REVIEW OF SYSTEMS:  Const: negative fever, negative chills, negative weight loss Eyes: negative diplopia or visual changes, negative eye pain ENT: negative coryza, negative sore throat Resp: negative cough, hemoptysis, dyspnea Cards: negative for chest pain, palpitations, lower extremity edema GU: negative for frequency, dysuria and hematuria Skin: negative for rash and pruritus Heme: negative for easy bruising and gum/nose bleeding MS: negative for myalgias, arthralgias, back pain and muscle weakness Neurolo:negative for headaches, dizziness, vertigo, memory problems  Psych: negative for feelings of anxiety, depression   Objective:  VITALS:  BP 116/75 (BP Location: Right  Arm, Patient Position: Sitting, Cuff Size: Normal)   Pulse (!) 104   Temp (!) 97.5 F (36.4 C) (Oral)   Ht 5\' 10"  (1.778 m)   Wt 185 lb (83.9 kg)   BMI 26.54 kg/m  PHYSICAL EXAM:  General: Alert, cooperative, no distress, appears stated age.  Head: Normocephalic, without obvious abnormality, atraumatic. Eyes: Conjunctivae clear, anicteric sclerae. Pupils are equal Nose: Nares normal. No drainage or sinus tenderness. Throat: Lips, mucosa, and tongue normal. No Thrush Neck: Supple, symmetrical, no adenopathy, thyroid: non  tender no carotid bruit and no JVD. Back: No CVA tenderness. Lungs: Clear to auscultation bilaterally. No Wheezing or Rhonchi. No rales. Heart: Regular rate and rhythm, no murmur, rub or gallop. Abdomen: Soft, non-tender,not distended. Bowel sounds normal. No masses Extremities: Extremities normal, atraumatic, no cyanosis. No edema. No clubbing Skin: No rashes or lesions. Not Jaundiced Lymph: Cervical, supraclavicular normal. Neurologic: Grossly non-focal Pertinent Labs 05/12/18 Cr 1.30 Lipid reviewed  Health maintenance Vaccination pneumovac- 23 Prevnar-13 HepB HepA TdaP Flu Herpes zoster- HPV ______________________ labs RPR HEPC ab Lipid-TC 91, HDL 40, LDL 38, TGL 66 CMv TOXO -IGG/IgM HIV VL Cd4 quantiferon Gold GC/CHL [id number] Genotype HIV antibody  Preventive  Dental Colonoscopy Opthal Anal pap  Impression/Recommendation ? ?HIV/AIDS- diagnosed in 1984 with nadir cd4 being 70. On biktarvy-100% adherent. Vl < 20 and cd4 is 616- will send labs today-Vl, CD$, RPR, quantiferon gold,   CAD - strong FH- has multiple stents- had STEMI in Aug 2019. - on tigacrelor, aspirin ? _creatinine high- CKD- says he took upto 5g/day of motrin in the past which made him hav e CKD Watch closely as on Tenofovir- will do Genotype archive to see whether he has any resistance- amy check to see whether he can do a non tenofovir regimen- I am not inclined to use abacavir because of CAD  Health maintenance need to be updated. He will get his vaccination record  He needs Anal PAP  _________________________________________________ Discussed with patient,in detail Follow up 6 months

## 2018-05-14 LAB — HEPATITIS B SURFACE ANTIBODY, QUANTITATIVE: Hep B S AB Quant (Post): <3.1 m[IU]/mL — ABNORMAL LOW (ref >9.9)

## 2018-05-14 LAB — HIV-1 RNA QUANT-NO REFLEX-BLD
HIV 1 RNA Quant: <20 copies/mL
LOG10 HIV-1 RNA: UNDETERMINED log10copy/mL

## 2018-05-14 LAB — HEPATITIS PANEL, ACUTE
HCV Ab: <0.1 s/co ratio (ref 0.0–0.9)
Hep A IgM: NEGATIVE
Hep B C IgM: NEGATIVE
Hepatitis B Surface Ag: NEGATIVE

## 2018-05-14 LAB — RPR: RPR Ser Ql: NONREACTIVE

## 2018-05-17 DIAGNOSIS — C44219 Basal cell carcinoma of skin of left ear and external auricular canal: Secondary | ICD-10-CM | POA: Diagnosis not present

## 2018-05-17 LAB — QUANTIFERON-TB GOLD PLUS: QuantiFERON-TB Gold Plus: NEGATIVE

## 2018-05-17 LAB — T-HELPER CELLS CD4/CD8 %
% CD 4 Pos. Lymph.: 47 % (ref 30.8–58.5)
ABSOLUTE CD 4 HELPER: UNDETERMINED /uL
CD3+CD4+ Cells/CD3+CD8+ Cells Bld: 1.55 (ref 0.92–3.72)
CD3+CD8+ CELLS # BLD: UNDETERMINED /uL
CD3+CD8+ Cells NFr Bld: 30.3 % (ref 12.0–35.5)

## 2018-05-17 LAB — QUANTIFERON-TB GOLD PLUS (RQFGPL)
QuantiFERON Mitogen Value: >10 IU/mL
QuantiFERON Nil Value: 0.09 IU/mL
QuantiFERON TB1 Ag Value: 0.1 IU/mL
QuantiFERON TB2 Ag Value: 0.1 IU/mL

## 2018-05-18 ENCOUNTER — Ambulatory Visit: Payer: Self-pay | Admitting: Infectious Diseases

## 2018-05-19 LAB — HLA B*5701: HLA B 5701: NEGATIVE

## 2018-05-21 DIAGNOSIS — Z48817 Encounter for surgical aftercare following surgery on the skin and subcutaneous tissue: Secondary | ICD-10-CM | POA: Diagnosis not present

## 2018-05-21 DIAGNOSIS — H6012 Cellulitis of left external ear: Secondary | ICD-10-CM | POA: Diagnosis not present

## 2018-05-24 DIAGNOSIS — Z48817 Encounter for surgical aftercare following surgery on the skin and subcutaneous tissue: Secondary | ICD-10-CM | POA: Diagnosis not present

## 2018-05-29 LAB — MISC LABCORP TEST (SEND OUT): Labcorp test code: 551776

## 2018-06-02 DIAGNOSIS — Z48817 Encounter for surgical aftercare following surgery on the skin and subcutaneous tissue: Secondary | ICD-10-CM | POA: Diagnosis not present

## 2018-06-09 DIAGNOSIS — Z48817 Encounter for surgical aftercare following surgery on the skin and subcutaneous tissue: Secondary | ICD-10-CM | POA: Diagnosis not present

## 2018-06-16 MED FILL — BIKTARVY 50-200-25 MG TABS: 50-200-25 | 30 days supply | Qty: 30 | Fill #1

## 2018-06-18 DIAGNOSIS — Z48817 Encounter for surgical aftercare following surgery on the skin and subcutaneous tissue: Secondary | ICD-10-CM | POA: Diagnosis not present

## 2018-07-01 DIAGNOSIS — Z48817 Encounter for surgical aftercare following surgery on the skin and subcutaneous tissue: Secondary | ICD-10-CM | POA: Diagnosis not present

## 2018-07-06 ENCOUNTER — Encounter: Payer: Self-pay | Admitting: Pharmacist

## 2018-07-06 ENCOUNTER — Ambulatory Visit (INDEPENDENT_AMBULATORY_CARE_PROVIDER_SITE_OTHER): Payer: 59 | Admitting: Pharmacist

## 2018-07-06 ENCOUNTER — Other Ambulatory Visit: Payer: Self-pay

## 2018-07-06 DIAGNOSIS — Z79899 Other long term (current) drug therapy: Secondary | ICD-10-CM

## 2018-07-06 NOTE — Progress Notes (Signed)
   S: Patient presents today to the [facility] for review of his specialty medication.  Patient currently on [place]. He was seeing Dr. Ola Spurr at [facility] but he is seeing Dr. Delaine Lame now at [facility].  Adherence: denies any missed doses of   Efficacy: [place] Odor is working well for him.   Dosing:  HIV-1 infection: Oral: One tablet once daily.  Drug-drug interactions: none  Monitoring: HIV RNA: undetectable (see below) CD4 count: unable to calc (see below) S/sx of opportunistic infections: denies Neuropsychiatric s/sx:denies Skin reactions: denies Hepatotoxicity: denies Nephrotoxicity: denies S/sx of lactic acidosis: denies S/sx of fractures: denies Fat redistribution: denies  O:     Lab Results  Component Value Date   WBC LA11 05/13/2018   HGB LA11 05/13/2018   HCT LA11 05/13/2018   MCV LA11 05/13/2018   PLT LA11 05/13/2018      Chemistry      Component Value Date/Time   NA 143 05/12/2018 1028   NA 139 07/21/2014 1521   K 4.5 05/12/2018 1028   K 3.9 07/21/2014 1521   CL 105 05/12/2018 1028   CL 105 07/21/2014 1521   CO2 22 05/12/2018 1028   CO2 28 07/21/2014 1521   BUN 16 05/12/2018 1028   BUN 29 (H) 07/21/2014 1521   CREATININE 1.30 (H) 05/12/2018 1028   CREATININE 1.02 07/21/2014 1521      Component Value Date/Time   CALCIUM 9.2 05/12/2018 1028   CALCIUM 8.8 (L) 07/21/2014 1521   ALKPHOS 72 11/16/2017 1106   ALKPHOS 90 07/21/2014 1521   AST 53 (H) 11/16/2017 1106   AST 24 07/21/2014 1521   ALT 22 11/16/2017 1106   ALT 19 07/21/2014 1521   BILITOT 1.0 11/16/2017 1106   BILITOT 0.3 04/18/2016 1024   BILITOT 0.2 (L) 07/21/2014 1521     Lab Results  Component Value Date   HIV1RNAQUANT <20 05/13/2018   No results found for: CD4TABS  A/P: 1. Medication review: patient on Biktarvy for HIV and is tolerating it well, with no adverse effects.  Pertinent labs all WNL, including SCr, LFTs, CBC. Reviewed  the medication with him, including the need for regular lab monitoring and adherence to his medication. No recommendations for changes.   Christella Hartigan, PharmD, BCPS, BCACP, CPP Clinical Pharmacist Practitioner  [phone]

## 2018-07-14 MED FILL — BIKTARVY 50-200-25 MG TABS: 50-200-25 | 30 days supply | Qty: 30 | Fill #2

## 2018-08-09 ENCOUNTER — Other Ambulatory Visit: Payer: Self-pay | Admitting: Pharmacist

## 2018-08-09 MED ORDER — BICTEGRAVIR-EMTRICITAB-TENOFOV 50-200-25 MG PO TABS: 1.0000 | ORAL_TABLET | Freq: Every day | ORAL | 2 refills | Status: DC

## 2018-08-23 ENCOUNTER — Telehealth: Payer: Self-pay | Admitting: Cardiovascular Disease

## 2018-08-23 ENCOUNTER — Encounter: Payer: Self-pay | Admitting: Cardiovascular Disease

## 2018-08-23 NOTE — Telephone Encounter (Signed)
Returned call to patient, pt would rather wait until next week. He agreed to call back if chest pain returned or sx worsened or changed.   Appt made next Tuesday at 9 am.   Advised pt to call for any further questions or concerns.

## 2018-08-23 NOTE — Telephone Encounter (Signed)
Attempted to call patient. LMTCB 08/23/2018

## 2018-08-23 NOTE — Telephone Encounter (Signed)
Pt reports 1 week of exertional chest pain and SOB.   Starts in L anterior chest and radiates to R side of chest.   He was working in the yard on Sunday, this was last episode of chest pain. None today.   He normally stays very active. 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   Vitals from yesterday 128/78, HR 60-90.  He denies taking any medication for chest pain as it improves with rest. He does have hx of MI last year.   Routed to provider to further advise.

## 2018-08-23 NOTE — Telephone Encounter (Signed)
Symptoms concerning for angina.  Would it be possible to add Daniel Black on for virtual visit w/ Dr. Rockey Situ this week? If he wished to wait to be seen in the office, Thurmond Butts will be having a schedule next Tuesday.

## 2018-08-23 NOTE — Telephone Encounter (Signed)
Pt c/o of Chest Pain: STAT if CP now or developed within 24 hours  1. Are you having CP right now? no  2. Are you experiencing any other symptoms (ex. SOB, nausea, vomiting, sweating)? Sob exertional   3. How long have you been experiencing CP? Unknown    4. Is your CP continuous or coming and going? Comes and goes   5. Have you taken Nitroglycerin? No   Patient declines virtual visit feels like provider needs to listen to heart .  Please call and or advise scheduling where to work into ov  ?

## 2018-08-23 NOTE — Telephone Encounter (Signed)
Depends on symptoms, but I'd prefer he be seen sooner rather than later if a provider has room to see him in clinic.

## 2018-08-25 NOTE — Progress Notes (Deleted)
Cardiology Office Note Date:  08/25/2018  Patient ID:  Daniel Black [birth year], MRN [id number] PCP:  Juline Patch, MD  Cardiologist:  Dr. Rockey Situ, MD  ***refresh   Chief Complaint: Chest pain  History of Present Illness: Daniel Black is a 58 y.o. male with history of CAD with an MI in 2004 s/p PCI to the LAD and RCA at that time s/p repeat PCI to the RCA in 2010 s/p inferior ST elevation MI in 11/2017 s/p PCI to the LCx, RCA, and AV groove as below, HIV, CKD stage III, HLD, Bell's palsy, DDD, kidney stones, and remote tobacco abuse who presents for evaluation of ***.   Prior LHC in 2010 showed  patent LAD stent, 30% distal LAD stenosis, 25% proximal LCx stenosis, 20% mid LCx stenosis, 95% mid RCA stenosis s/p PCI/DES, and 40% distal RCA stenosis. Prior EF normal by Myoview in 2010. He was admitted to the hospital in 11/2017 with an inferior ST elevation MI. LHC on 11/16/2017 showed severe 3-vessel CAD with patent stents in the LAD and RCA. There was severe stenosis in the distal RCA and posterior AV groove artery felt to be the culprit of his STEMI. There was evidence of embolization into the distal PL3. There was also severe stenosis in the mid LCx. During the case, he had a VF arrest shortly after obtaining arterial access that was successfully defibrillated. He underwent successful PCI/DES to the distal RCA as well as posterior AV groove segment. Echo on 11/16/2017 showed an EF of 55-60%, mild HK of the inferior myocardium, normal LV diastolic function. He underwent staged PCI of the LCx on 11/19/2017 with a second stent also being placed overlapping proximally due to suspected edge dissection.   Cath details from 11/19/2017: proximal to mid LAD 10%, mid LAD 30%, proximal to mid LCx 95%, proximal to mid RCA 20%, mid to distal RCA 30%, previously placed distal RCA stent widely patent, ostial rPDA 95%, previously placed post AV groove stent widely patent, post atrio lesion-2 100%.  He  was mos recently seen in the office in 11/2017 and was doing reasonably well with occasional chest discomfort with exertion.   Labs: 05/2018 - TC 91, TG 66, HDL 40, LDL 38, SCr 1.30, K+ 4.5, HGB 15.5, viral load < 20  Patient called on 08/23/2018 noting a 1 week history of chest pain and SOB.   ***   Past Medical History:  Diagnosis Date  . Asymptomatic HIV infection ([place])   . Bell's palsy 2004  . CAD (coronary artery disease)    a. MI in 2004 w/ PCI to LAD and mRCA; b. LHC 07/2008: patent LAD stent, 30% distal LAD stenosis, 25% proximal LCx stenosis, 20% mid LCx stenosis, 95% mid RCA stenosis s/p PCI/DES (Xience 3.5 x 28 mm), and 40% distal RCA stenosis  . DDD (degenerative disc disease) 10/02   has lumbar fusion  . Erectile dysfunction   . HLD (hyperlipidemia)    mixed  . Myocardial infarction [facility]) 2004   x 3- last was July 2019 has 6 stents  . Nephrolithiasis    stage 3  . Skin cancer, basal cell    skin    Past Surgical History:  Procedure Laterality Date  . AMPUTATION  1985   L fingers reattachement   . back fusion     lumbar  . CARDIAC CATHETERIZATION     x2. 2004, 2010. 3 stents(total)  . CHOLECYSTECTOMY  3/03  . COLONOSCOPY WITH PROPOFOL N/A  06/13/2016   Procedure: COLONOSCOPY WITH PROPOFOL;  Surgeon: Lucilla Lame, MD;  Location: [place];  Service: Endoscopy;  Laterality: N/A;  . CORONARY/GRAFT ACUTE MI REVASCULARIZATION N/A 11/16/2017   Procedure: Coronary/Graft Acute MI Revascularization;  Surgeon: [name], MD;  Location: [place] [facility];  Service: Cardiovascular;  Laterality: N/A;  . LEFT HEART CATH AND CORONARY ANGIOGRAPHY N/A 11/16/2017   Procedure: LEFT HEART CATH AND CORONARY ANGIOGRAPHY;  Surgeon: [name], MD;  Location: [place] [facility];  Service: Cardiovascular;  Laterality: N/A;  . LEFT HEART CATH AND CORONARY ANGIOGRAPHY N/A 11/19/2017   Procedure: LEFT HEART CATH AND CORONARY ANGIOGRAPHY;  Surgeon: [name],  MD;  Location: [place] [facility];  Service: Cardiovascular;  Laterality: N/A;  . POLYPECTOMY  06/13/2016   Procedure: POLYPECTOMY;  Surgeon: Lucilla Lame, MD;  Location: [place];  Service: Endoscopy;;  . WRIST SURGERY  10/2011    No outpatient medications have been marked as taking for the 08/31/18 encounter (Appointment) with Rise Mu, PA-C.    Allergies:   Patient has no known allergies.   Social History:  The patient  reports that he quit smoking about 8 years ago. He has never used smokeless tobacco. He reports that he does not drink alcohol or use drugs.   Family History:  The patient's family history includes Cancer in his father; Coronary artery disease in his father and another family member; Hearing loss in his father and mother; Heart disease in his father and mother; Hypertension in his father and mother; Kidney disease in his father; Stroke in his father; Varicose Veins in his father; Vision loss in his father.  ROS:   ROS   PHYSICAL EXAM: *** VS:  There were no vitals taken for this visit. BMI: There is no height or weight on file to calculate BMI.  Physical Exam   EKG:  Was ordered and interpreted by me today. Shows ***  Recent Labs: 11/16/2017: ALT 22 11/20/2017: Magnesium 1.9; TSH 4.546 05/12/2018: BUN 16; Creatinine, Ser 1.30; Potassium 4.5; Sodium 143 05/13/2018: Hemoglobin LA11; Platelets LA11  11/17/2017: VLDL 11 05/12/2018: Chol/HDL Ratio 2.3; Cholesterol, Total 91; HDL 40; LDL Calculated 38; Triglycerides 66   CrCl cannot be calculated (Patient's most recent lab result is older than the maximum 21 days allowed.).   Wt Readings from Last 3 Encounters:  05/13/18 185 lb (83.9 kg)  05/12/18 185 lb (83.9 kg)  04/29/18 186 lb (84.4 kg)     Other studies reviewed: Additional studies/records reviewed today include: summarized above  ASSESSMENT AND PLAN:  1. ***  Disposition: F/u with Dr. Marland Kitchen or an APP in ***.  Current medicines are reviewed at  length with the patient today.  The patient did not have any concerns regarding medicines.  Signed, Christell Faith, PA-C 08/25/2018 12:56 PM     [address] [place], [place] [address] [phone]

## 2018-08-27 ENCOUNTER — Telehealth: Payer: Self-pay

## 2018-08-27 NOTE — Telephone Encounter (Signed)
Pt has in office appt 5/26. Call to prescreen for CV19.       COVID-19 Pre-Screening Questions:  . In the past 7 to 10 days have you had a cough,  shortness of breath, headache, congestion, fever (100 or greater) body aches, chills, sore throat, or sudden loss of taste or sense of smell? NO . Have you been around anyone with known Covid 19. NO . Have you been around anyone who is awaiting Covid 19 test results in the past 7 to 10 days? NO . Have you been around anyone who has been exposed to Covid 19, or has mentioned symptoms of Covid 19 within the past 7 to 10 days? NO  If you have any concerns/questions about symptoms patients report during screening (either on the phone or at threshold). Contact the provider seeing the patient or DOD for further guidance.  If neither are available contact a member of the leadership team.  Reviewed procedure for in office visit. Pt verbalized understanding as is cooperative with plan.

## 2018-08-31 ENCOUNTER — Ambulatory Visit: Payer: 59 | Admitting: Physician Assistant

## 2018-08-31 NOTE — Progress Notes (Signed)
Cardiology Office Note Date:  09/01/2018  Patient ID:  Daniel Black, Daniel Black Nov 19, 1960, MRN [id number] PCP:  Juline Patch, MD  Cardiologist:  Dr. Rockey Situ, MD    Chief Complaint: Chest pain  History of Present Illness: Daniel Black is a 58 y.o. male with history of CAD with an MI in 2004 s/p PCI to the LAD and RCA at that time s/p repeat PCI to the RCA in 2010 s/p inferior ST elevation MI in 11/2017 s/p PCI to the LCx, RCA, and AV groove as below, HIV, CKD stage III, HLD, Bell's palsy, DDD, kidney stones, and remote tobacco abuse who presents for evaluation of chest pain.   Prior LHC in 2010 showed  patent LAD stent, 30% distal LAD stenosis, 25% proximal LCx stenosis, 20% mid LCx stenosis, 95% mid RCA stenosis s/p PCI/DES, and 40% distal RCA stenosis. Prior EF normal by Myoview in 2010. He was admitted to the hospital in 11/2017 with an inferior ST elevation MI. LHC on 11/16/2017 showed severe 3-vessel CAD with patent stents in the LAD and RCA. There was severe stenosis in the distal RCA and posterior AV groove artery felt to be the culprit of his STEMI. There was evidence of embolization into the distal PL3 and in the mid LCx. During the case, he had a VF arrest shortly after obtaining arterial access that was successfully defibrillated. He underwent successful PCI/DES to the distal RCA as well as posterior AV groove segment. Echo on 11/16/2017 showed an EF of 55-60%, mild HK of the inferior myocardium, normal LV diastolic function. He underwent staged PCI of the LCx on 11/19/2017 with a second stent also being placed overlapping proximally due to suspected edge dissection.   Cath details from 11/19/2017: proximal to mid LAD 10%, mid LAD 30%, proximal to mid LCx 95%, proximal to mid RCA 20%, mid to distal RCA 30%, previously placed distal RCA stent widely patent, ostial rPDA 95%, previously placed post AV groove stent widely patent, post atrio lesion-2 100%.  He was most recently seen in the  office in 11/2017 and was doing reasonably well with occasional chest discomfort with exertion.   Labs: 05/2018 - TC 91, TG 66, HDL 40, LDL 38, SCr 1.30, K+ 4.5, HGB 15.5, viral load < 20  Patient called on 08/23/2018 noting a 1 week history of chest pain and SOB.   Patient comes in today noting a long history of SOB, though this has been getting worse over the past several weeks with associated chest tightness that will come on if he exerts himself. He gives the example of needing to rest after "climbing 100 stairs." With rest, symptoms improve. Symptoms do not feel similar to his MI. He does wonder if symptoms are related to some of his medications. He reports compliance with all medications. No BRBPR or melena. No falls. No lower extremity swelling, orthopnea, PND, or early satiety. No dizziness, presyncope, or syncope. Currently, asymptomatic.    Past Medical History:  Diagnosis Date  . Asymptomatic HIV infection ([place])   . Bell's palsy 2004  . CAD (coronary artery disease)    a. MI in 2004 w/ PCI to LAD and mRCA; b. LHC 07/2008: patent LAD stent, 30% distal LAD stenosis, 25% proximal LCx stenosis, 20% mid LCx stenosis, 95% mid RCA stenosis s/p PCI/DES (Xience 3.5 x 28 mm), and 40% distal RCA stenosis  . DDD (degenerative disc disease) 10/02   has lumbar fusion  . Erectile dysfunction   . HLD (hyperlipidemia)  mixed  . Myocardial infarction ([place]) 2004   x 3- last was July 2019 has 6 stents  . Nephrolithiasis    stage 3  . Skin cancer, basal cell    skin    Past Surgical History:  Procedure Laterality Date  . AMPUTATION  1985   L fingers reattachement   . back fusion     lumbar  . CARDIAC CATHETERIZATION     x2. 2004, 2010. 3 stents(total)  . CHOLECYSTECTOMY  3/03  . COLONOSCOPY WITH PROPOFOL N/A 06/13/2016   Procedure: COLONOSCOPY WITH PROPOFOL;  Surgeon: Lucilla Lame, MD;  Location: [place];  Service: Endoscopy;  Laterality: N/A;  . CORONARY/GRAFT ACUTE MI  REVASCULARIZATION N/A 11/16/2017   Procedure: Coronary/Graft Acute MI Revascularization;  Surgeon: [name], MD;  Location: [place] [facility];  Service: Cardiovascular;  Laterality: N/A;  . LEFT HEART CATH AND CORONARY ANGIOGRAPHY N/A 11/16/2017   Procedure: LEFT HEART CATH AND CORONARY ANGIOGRAPHY;  Surgeon: [name], MD;  Location: [place] [facility];  Service: Cardiovascular;  Laterality: N/A;  . LEFT HEART CATH AND CORONARY ANGIOGRAPHY N/A 11/19/2017   Procedure: LEFT HEART CATH AND CORONARY ANGIOGRAPHY;  Surgeon: [name], MD;  Location: [place] [facility];  Service: Cardiovascular;  Laterality: N/A;  . POLYPECTOMY  06/13/2016   Procedure: POLYPECTOMY;  Surgeon: Lucilla Lame, MD;  Location: [place];  Service: Endoscopy;;  . WRIST SURGERY  10/2011    Current Meds  Medication Sig  . aspirin 81 MG chewable tablet Chew 1 tablet (81 mg total) by mouth daily.  . bictegravir-emtricitabine-tenofovir AF (BIKTARVY) 50-200-25 MG TABS tablet Take 1 tablet by mouth daily.  Marland Kitchen ezetimibe (ZETIA) 10 MG tablet Take 1 tablet (10 mg total) by mouth daily.  . Multiple Vitamin (MULTIVITAMIN) tablet Take 1 tablet by mouth daily.  . rosuvastatin (CRESTOR) 40 MG tablet Take 1 tablet (40 mg total) by mouth daily.  . ticagrelor (BRILINTA) 90 MG TABS tablet Take 1 tablet (90 mg total) by mouth 2 (two) times daily. (Patient taking differently: Take 90 mg by mouth 2 (two) times daily. Dr Rockey Situ)    Allergies:   Patient has no known allergies.   Social History:  The patient  reports that he quit smoking about 8 years ago. He has never used smokeless tobacco. He reports that he does not drink alcohol or use drugs.   Family History:  The patient's family history includes Cancer in his father; Coronary artery disease in his father and another family member; Hearing loss in his father and mother; Heart disease in his father and mother; Hypertension in his father and mother; Kidney  disease in his father; Stroke in his father; Varicose Veins in his father; Vision loss in his father.  ROS:   Review of Systems  Constitutional: Positive for malaise/fatigue. Negative for chills, diaphoresis, fever and weight loss.  HENT: Negative for congestion.   Eyes: Negative for discharge and redness.  Respiratory: Positive for shortness of breath. Negative for cough, hemoptysis, sputum production and wheezing.   Cardiovascular: Positive for chest pain. Negative for palpitations, orthopnea, claudication, leg swelling and PND.  Gastrointestinal: Negative for abdominal pain, blood in stool, heartburn, melena, nausea and vomiting.  Genitourinary: Negative for hematuria.  Musculoskeletal: Negative for falls and myalgias.  Skin: Negative for rash.  Neurological: Negative for dizziness, tingling, tremors, sensory change, speech change, focal weakness, loss of consciousness and weakness.  Endo/Heme/Allergies: Does not bruise/bleed easily.  Psychiatric/Behavioral: Negative for substance abuse.  The patient is not nervous/anxious.   All other systems reviewed and are negative.    PHYSICAL EXAM:  VS:  BP 128/68 (BP Location: Left Arm, Patient Position: Sitting, Cuff Size: Normal)   Pulse 62   Ht 5\' 10"  (1.778 m)   Wt 189 lb 1.9 oz (85.8 kg)   BMI 27.14 kg/m  BMI: Body mass index is 27.14 kg/m.  Physical Exam  Constitutional: He is oriented to person, place, and time. He appears well-developed and well-nourished.  HENT:  Head: Normocephalic and atraumatic.  Eyes: Right eye exhibits no discharge. Left eye exhibits no discharge.  Neck: Normal range of motion. No JVD present.  Cardiovascular: Normal rate, regular rhythm, S1 normal, S2 normal and normal heart sounds. Exam reveals no distant heart sounds, no friction rub, no midsystolic click and no opening snap.  No murmur heard. Pulses:      Posterior tibial pulses are 2+ on the right side and 2+ on the left side.  Pulmonary/Chest: Effort  normal and breath sounds normal. No respiratory distress. He has no decreased breath sounds. He has no wheezes. He has no rales. He exhibits no tenderness.  Abdominal: Soft. He exhibits no distension. There is no abdominal tenderness.  Musculoskeletal:        General: No edema.  Neurological: He is alert and oriented to person, place, and time.  Skin: Skin is warm and dry. No cyanosis. Nails show no clubbing.  Psychiatric: He has a normal mood and affect. His speech is normal and behavior is normal. Judgment and thought content normal.     EKG:  Was ordered and interpreted by me today. Shows NSR, 62 bpm, prior inferior infarct, no acute st/t changes   Recent Labs: 11/16/2017: ALT 22 11/20/2017: Magnesium 1.9; TSH 4.546 05/12/2018: BUN 16; Creatinine, Ser 1.30; Potassium 4.5; Sodium 143 05/13/2018: Hemoglobin LA11; Platelets LA11  11/17/2017: VLDL 11 05/12/2018: Chol/HDL Ratio 2.3; Cholesterol, Total 91; HDL 40; LDL Calculated 38; Triglycerides 66   CrCl cannot be calculated (Patient's most recent lab result is older than the maximum 21 days allowed.).   Wt Readings from Last 3 Encounters:  09/01/18 189 lb 1.9 oz (85.8 kg)  05/13/18 185 lb (83.9 kg)  05/12/18 185 lb (83.9 kg)     Other studies reviewed: Additional studies/records reviewed today include: summarized above  ASSESSMENT AND PLAN:  1. Multivessel CAD involving the native coronary arteries with stable angina: Currently chest pain free. Discussion with patient regarding invasive and noninvasive evaluation modalities as well as optimization of medical therapy. We have agreed to defer stress testing at this time and proceed with Imdur 30 mg daily. If he continues to have symptoms with this, or if they progress we will proceed with LHC. Continue DAPT with ASA and Brilinta as he is not yet 12 months out from his MI. He does wonder if some of his SOB is related to his medications, including antiviral therapy and Brilinta. This is certainly  a possibility, though we will not change his Brilinta at this time given the above. Aggressive risk factor modification and secondary prevention.   2. CKD stage III: Renal function stable in 05/2018.   3. HLD: LDL of 38 from 05/2018. Remains on Crestor and Zetia.   4. HIV: On antiviral therapy. Followed by ID.   Disposition: F/u with Dr. Rockey Situ or an APP in 1 month.  Current medicines are reviewed at length with the patient today.  The patient did not have any concerns regarding medicines.  Signed, Christell Faith, PA-C 09/01/2018 9:22 AM     [facility] [address] [place], [place] [address] [phone]

## 2018-09-01 ENCOUNTER — Other Ambulatory Visit: Payer: Self-pay

## 2018-09-01 ENCOUNTER — Ambulatory Visit: Payer: 59 | Admitting: Physician Assistant

## 2018-09-01 ENCOUNTER — Encounter: Payer: Self-pay | Admitting: Physician Assistant

## 2018-09-01 VITALS — BP 128/68 | HR 62 | Ht 70.0 in | Wt 189.1 lb

## 2018-09-01 DIAGNOSIS — N183 Chronic kidney disease, stage 3 unspecified: Secondary | ICD-10-CM

## 2018-09-01 DIAGNOSIS — I25118 Atherosclerotic heart disease of native coronary artery with other forms of angina pectoris: Secondary | ICD-10-CM | POA: Diagnosis not present

## 2018-09-01 DIAGNOSIS — B20 Human immunodeficiency virus [HIV] disease: Secondary | ICD-10-CM | POA: Diagnosis not present

## 2018-09-01 DIAGNOSIS — E785 Hyperlipidemia, unspecified: Secondary | ICD-10-CM

## 2018-09-01 MED ORDER — ISOSORBIDE MONONITRATE ER 30 MG PO TB24: 30.0000 mg | ORAL_TABLET | Freq: Every day | ORAL | 3 refills | Status: DC

## 2018-09-01 NOTE — Patient Instructions (Signed)
Medication Instructions:  Your physician has recommended you make the following change in your medication:  1- START Imdur Take 1 tablet (30 mg total) by mouth daily.   If you need a refill on your cardiac medications before your next appointment, please call your pharmacy.   Lab work: None ordered  If you have labs (blood work) drawn today and your tests are completely normal, you will receive your results only by: Marland Kitchen MyChart Message (if you have MyChart) OR . A paper copy in the mail If you have any lab test that is abnormal or we need to change your treatment, we will call you to review the results.  Testing/Procedures: None ordered   Follow-Up: At [facility], you and your health needs are our priority.  As part of our continuing mission to provide you with exceptional heart care, we have created designated Provider Care Teams.  These Care Teams include your primary Cardiologist (physician) and Advanced Practice Providers (APPs -  Physician Assistants and Nurse Practitioners) who all work together to provide you with the care you need, when you need it. You will need a follow up appointment in 1 months.  You may see Ida Rogue, MD or Christell Faith, PA-C.  Any Other Special Instructions Will Be Listed Below (If Applicable). Please call in 1 week to give update on symptoms. Could also send my chart message to Christell Faith, PA.

## 2018-09-07 MED FILL — BIKTARVY 50-200-25 MG TABS: 50-200-25 | 30 days supply | Qty: 30 | Fill #0

## 2018-09-29 NOTE — Progress Notes (Signed)
Cardiology Office Note Date:  10/05/2018  Patient ID:  Daniel Black, Daniel Black Jun 19, 1960, MRN [id number] PCP:  Juline Patch, MD  Cardiologist:  Dr. Rockey Situ, MD    Chief Complaint: Follow-up  History of Present Illness: Daniel Black is a 58 y.o. male with history of CAD with an MI in 2004 s/p PCI to the LAD and RCA at that time s/p repeat PCI to the RCA in 2010 s/p inferior ST elevation MI in 11/2017 s/p PCI to the LCx, RCA, and AV groove as below, HIV, CKD stage III, HLD, Bell's palsy, DDD, kidney stones, and remote tobacco abuse who presents for follow-up of chest pain.   Prior LHC in 2010 showed patent LAD stent, 30% distal LAD stenosis, 25% proximal LCx stenosis, 20% mid LCx stenosis, 95% mid RCA stenosis s/p PCI/DES, and 40% distal RCA stenosis. Prior EF normal by Myoview in 2010. He was admitted to the hospital in 11/2017 with an inferior ST elevation MI. LHC on 11/16/2017 showed severe 3-vessel CAD with patent stents in the LAD and RCA. There was severe stenosis in the distal RCA and posterior AV groove artery felt to be the culprit of his STEMI. There was evidence of embolization into the distal PL3 and in the mid LCx. During the case, he had a VF arrest shortly after obtaining arterial access that was successfully defibrillated. He underwent successful PCI/DES to the distal RCA as well as posterior AV groove segment. Echo on 11/16/2017 showed an EF of 55-60%, mild HK of the inferior myocardium, normal LV diastolic function. He underwent staged PCI of the LCx on 11/19/2017 with a second stent also being placed overlapping proximally due to suspected edge dissection.   Cath details from 11/19/2017: proximal to mid LAD 10%, mid LAD 30%, proximal to mid LCx 95% s/p PCI/DES with 2nd stent overlapping proximally due to suspected edge dissection, proximal to mid RCA 20%, mid to distal RCA 30%, previously placed distal RCA stent widely patent, ostial rPDA 95%, previously placed post AV groove  stent widely patent, post atrio lesion-2 100%.  He was seen in 11/2017, and was doing reasonably well with occasional chest discomfort with exertion.  He was seen most recently on 09/01/2018 noting a long history of shortness of breath that has been getting worse over the past several weeks with associated chest tightness that was exertional.  We elected to proceed with escalation of antianginal therapy with addition of isosorbide mononitrate with subsequent MyChart message indicating symptom resolution on escalation of medical therapy.  He comes in doing well today.  He continues to note significant improvement and chest pain and shortness of breath following the addition of Imdur.  He states he will still occasionally get a brief episode of chest pain when walking up an incline though much improved from prior and lasting only a few seconds.  No falls, BRBPR, or melena.  No lower extremity swelling, abdominal distention, orthopnea, PND, early satiety.  He is pleased with his improvement.  Labs: 05/2018 - TC 91, TG 66, HDL 40, LDL 38, SCr 1.30, K+ 4.5, HGB 15.5, viral load < 20    Past Medical History:  Diagnosis Date  . Asymptomatic HIV infection ([place])   . Bell's palsy 2004  . CAD (coronary artery disease)    a. MI in 2004 w/ PCI to LAD and mRCA; b. LHC 07/2008: patent LAD stent, 30% distal LAD stenosis, 25% proximal LCx stenosis, 20% mid LCx stenosis, 95% mid RCA stenosis s/p PCI/DES (Xience  3.5 x 28 mm), and 40% distal RCA stenosis  . DDD (degenerative disc disease) 10/02   has lumbar fusion  . Erectile dysfunction   . HLD (hyperlipidemia)    mixed  . Myocardial infarction [facility]) 2004   x 3- last was July 2019 has 6 stents  . Nephrolithiasis    stage 3  . Skin cancer, basal cell    skin    Past Surgical History:  Procedure Laterality Date  . AMPUTATION  1985   L fingers reattachement   . back fusion     lumbar  . CARDIAC CATHETERIZATION     x2. 2004, 2010. 3 stents(total)  .  CHOLECYSTECTOMY  3/03  . COLONOSCOPY WITH PROPOFOL N/A 06/13/2016   Procedure: COLONOSCOPY WITH PROPOFOL;  Surgeon: Lucilla Lame, MD;  Location: [facility];  Service: Endoscopy;  Laterality: N/A;  . CORONARY/GRAFT ACUTE MI REVASCULARIZATION N/A 11/16/2017   Procedure: Coronary/Graft Acute MI Revascularization;  Surgeon: [name], MD;  Location: [facility];  Service: Cardiovascular;  Laterality: N/A;  . LEFT HEART CATH AND CORONARY ANGIOGRAPHY N/A 11/16/2017   Procedure: LEFT HEART CATH AND CORONARY ANGIOGRAPHY;  Surgeon: [name], MD;  Location: [place] [facility];  Service: Cardiovascular;  Laterality: N/A;  . LEFT HEART CATH AND CORONARY ANGIOGRAPHY N/A 11/19/2017   Procedure: LEFT HEART CATH AND CORONARY ANGIOGRAPHY;  Surgeon: [name], MD;  Location: [place] [facility];  Service: Cardiovascular;  Laterality: N/A;  . POLYPECTOMY  06/13/2016   Procedure: POLYPECTOMY;  Surgeon: Lucilla Lame, MD;  Location: [facility];  Service: Endoscopy;;  . WRIST SURGERY  10/2011    Current Meds  Medication Sig  . aspirin 81 MG chewable tablet Chew 1 tablet (81 mg total) by mouth daily.  . bictegravir-emtricitabine-tenofovir AF (BIKTARVY) 50-200-25 MG TABS tablet Take 1 tablet by mouth daily.  Marland Kitchen ezetimibe (ZETIA) 10 MG tablet Take 1 tablet (10 mg total) by mouth daily.  . isosorbide mononitrate (IMDUR) 30 MG 24 hr tablet Take 1 tablet (30 mg total) by mouth daily.  . Multiple Vitamin (MULTIVITAMIN) tablet Take 1 tablet by mouth daily.  . rosuvastatin (CRESTOR) 40 MG tablet Take 1 tablet (40 mg total) by mouth daily.  . ticagrelor (BRILINTA) 90 MG TABS tablet Take 1 tablet (90 mg total) by mouth 2 (two) times daily. (Patient taking differently: Take 90 mg by mouth 2 (two) times daily. Dr Rockey Situ)    Allergies:   Patient has no known allergies.   Social History:  The patient  reports that he quit smoking about 8 years ago. He has never used smokeless  tobacco. He reports that he does not drink alcohol or use drugs.   Family History:  The patient's family history includes Cancer in his father; Coronary artery disease in his father and another family member; Hearing loss in his father and mother; Heart disease in his father and mother; Hypertension in his father and mother; Kidney disease in his father; Stroke in his father; Varicose Veins in his father; Vision loss in his father.  ROS:   Review of Systems  Constitutional: Negative for chills, diaphoresis, fever, malaise/fatigue and weight loss.  HENT: Negative for congestion.   Eyes: Negative for discharge and redness.  Respiratory: Positive for shortness of breath. Negative for cough, hemoptysis, sputum production and wheezing.        Much improved  Cardiovascular: Positive for chest pain. Negative for palpitations, orthopnea, claudication, leg swelling and PND.  Much improved  Gastrointestinal: Negative for abdominal pain, blood in stool, heartburn, melena, nausea and vomiting.  Genitourinary: Negative for hematuria.  Musculoskeletal: Positive for joint pain. Negative for falls and myalgias.       Bilateral knee pain first thing in the morning  Skin: Negative for rash.  Neurological: Negative for dizziness, tingling, tremors, sensory change, speech change, focal weakness, loss of consciousness and weakness.  Endo/Heme/Allergies: Does not bruise/bleed easily.  Psychiatric/Behavioral: Negative for substance abuse. The patient is not nervous/anxious.   All other systems reviewed and are negative.    PHYSICAL EXAM:  VS:  BP 114/80 (BP Location: Left Arm, Patient Position: Sitting, Cuff Size: Normal)   Pulse 66   Ht 5\' 10"  (1.778 m)   Wt 190 lb 12 oz (86.5 kg)   BMI 27.37 kg/m  BMI: Body mass index is 27.37 kg/m.  Physical Exam  Constitutional: He is oriented to person, place, and time. He appears well-developed and well-nourished.  HENT:  Head: Normocephalic and atraumatic.   Eyes: Right eye exhibits no discharge. Left eye exhibits no discharge.  Neck: Normal range of motion. No JVD present.  Cardiovascular: Normal rate, regular rhythm, S1 normal, S2 normal and normal heart sounds. Exam reveals no distant heart sounds, no friction rub, no midsystolic click and no opening snap.  No murmur heard. Pulses:      Posterior tibial pulses are 2+ on the right side and 2+ on the left side.  Pulmonary/Chest: Effort normal and breath sounds normal. No respiratory distress. He has no decreased breath sounds. He has no wheezes. He has no rales. He exhibits no tenderness.  Abdominal: Soft. He exhibits no distension. There is no abdominal tenderness.  Musculoskeletal:        General: No edema.  Neurological: He is alert and oriented to person, place, and time.  Skin: Skin is warm and dry. No cyanosis. Nails show no clubbing.  Psychiatric: He has a normal mood and affect. His speech is normal and behavior is normal. Judgment and thought content normal.     EKG:  Was ordered and interpreted by me today. Shows NSR, 66 bpm, prior inferior infarct, no acute st/t changes   Recent Labs: 11/16/2017: ALT 22 11/20/2017: Magnesium 1.9; TSH 4.546 05/12/2018: BUN 16; Creatinine, Ser 1.30; Potassium 4.5; Sodium 143 05/13/2018: Hemoglobin LA11; Platelets LA11  11/17/2017: VLDL 11 05/12/2018: Chol/HDL Ratio 2.3; Cholesterol, Total 91; HDL 40; LDL Calculated 38; Triglycerides 66   CrCl cannot be calculated (Patient's most recent lab result is older than the maximum 21 days allowed.).   Wt Readings from Last 3 Encounters:  10/05/18 190 lb 12 oz (86.5 kg)  09/01/18 189 lb 1.9 oz (85.8 kg)  05/13/18 185 lb (83.9 kg)     Other studies reviewed: Additional studies/records reviewed today include: summarized above  ASSESSMENT AND PLAN:  1. Multivessel CAD involving the native coronary arteries with stable angina: Symptoms are much improved following addition of Imdur.  Continue current  medications including dual antiplatelet therapy with aspirin and Brilinta without interruption through at least 11/2018.  Continue Imdur, Crestor, and Zetia.  Aggressive risk factor modification and secondary prevention.  He will have completed 12 months of dual antiplatelet therapy in 11/2018.  At that time, he will contact our office and update [place] with how he is feeling.  We will forward the message to interventional cardiology for input regarding dual antiplatelet therapy moving forward.  2. CKD stage III: Renal function stable in 05/2018.  3. Hyperlipidemia: LDL  of 38 from 05/2018.  Continue Crestor and Zetia.  4. HIV: On antiviral therapy.  Followed by ID.  Disposition: F/u with Dr. Rockey Situ or an APP in 6 months.  Current medicines are reviewed at length with the patient today.  The patient did not have any concerns regarding medicines.  Signed, Christell Faith, PA-C 10/05/2018 10:34 AM     [address], [place] [id number] [phone]

## 2018-09-30 ENCOUNTER — Telehealth: Payer: Self-pay | Admitting: *Deleted

## 2018-09-30 NOTE — Telephone Encounter (Signed)

## 2018-10-05 ENCOUNTER — Other Ambulatory Visit: Payer: Self-pay

## 2018-10-05 ENCOUNTER — Encounter: Payer: Self-pay | Admitting: Physician Assistant

## 2018-10-05 ENCOUNTER — Ambulatory Visit: Payer: 59 | Admitting: Cardiovascular Disease

## 2018-10-05 ENCOUNTER — Ambulatory Visit (INDEPENDENT_AMBULATORY_CARE_PROVIDER_SITE_OTHER): Payer: 59 | Admitting: Physician Assistant

## 2018-10-05 VITALS — BP 114/80 | HR 66 | Ht 70.0 in | Wt 190.8 lb

## 2018-10-05 DIAGNOSIS — B20 Human immunodeficiency virus [HIV] disease: Secondary | ICD-10-CM | POA: Diagnosis not present

## 2018-10-05 DIAGNOSIS — I25118 Atherosclerotic heart disease of native coronary artery with other forms of angina pectoris: Secondary | ICD-10-CM | POA: Diagnosis not present

## 2018-10-05 DIAGNOSIS — E785 Hyperlipidemia, unspecified: Secondary | ICD-10-CM

## 2018-10-05 DIAGNOSIS — N183 Chronic kidney disease, stage 3 unspecified: Secondary | ICD-10-CM

## 2018-10-05 MED ORDER — EZETIMIBE 10 MG PO TABS: 10.0000 mg | ORAL_TABLET | Freq: Every day | ORAL | 3 refills | Status: DC

## 2018-10-05 MED ORDER — ROSUVASTATIN CALCIUM 40 MG PO TABS: 40.0000 mg | ORAL_TABLET | Freq: Every day | ORAL | 3 refills | Status: DC

## 2018-10-05 MED ORDER — ISOSORBIDE MONONITRATE ER 30 MG PO TB24: 30.0000 mg | ORAL_TABLET | Freq: Every day | ORAL | 3 refills | Status: DC

## 2018-10-05 NOTE — Patient Instructions (Signed)
Medication Instructions:  Your physician recommends that you continue on your current medications as directed. Please refer to the Current Medication list given to you today.  If you need a refill on your cardiac medications before your next appointment, please call your pharmacy.   Lab work: None ordered  If you have labs (blood work) drawn today and your tests are completely normal, you will receive your results only by: Marland Kitchen MyChart Message (if you have MyChart) OR . A paper copy in the mail If you have any lab test that is abnormal or we need to change your treatment, we will call you to review the results.  Testing/Procedures: None ordered  Follow-Up: At [facility], you and your health needs are our priority.  As part of our continuing mission to provide you with exceptional heart care, we have created designated Provider Care Teams.  These Care Teams include your primary Cardiologist (physician) and Advanced Practice Providers (APPs -  Physician Assistants and Nurse Practitioners) who all work together to provide you with the care you need, when you need it. You will need a follow up appointment - call in August to let us know.

## 2018-10-11 MED FILL — BIKTARVY 50-200-25 MG TABS: 50-200-25 | 30 days supply | Qty: 30 | Fill #1

## 2018-11-09 MED FILL — BIKTARVY 50-200-25 MG TABS: 50-200-25 | 30 days supply | Qty: 30 | Fill #2

## 2018-11-11 ENCOUNTER — Other Ambulatory Visit
Admission: RE | Admit: 2018-11-11 | Discharge: 2018-11-11 | Disposition: A | Discharge status: Home / Self Care | Payer: 59 | Source: Ambulatory Visit | Attending: Infectious Diseases | Admitting: Infectious Diseases

## 2018-11-11 ENCOUNTER — Other Ambulatory Visit: Payer: Self-pay | Admitting: Infectious Diseases

## 2018-11-11 ENCOUNTER — Ambulatory Visit: Payer: 59 | Attending: Infectious Diseases | Admitting: Infectious Diseases

## 2018-11-11 ENCOUNTER — Other Ambulatory Visit: Payer: Self-pay

## 2018-11-11 ENCOUNTER — Encounter: Payer: Self-pay | Admitting: Infectious Diseases

## 2018-11-11 VITALS — BP 115/74 | HR 80 | Temp 97.6°F | Ht 70.0 in | Wt 194.0 lb

## 2018-11-11 DIAGNOSIS — Z87891 Personal history of nicotine dependence: Secondary | ICD-10-CM

## 2018-11-11 DIAGNOSIS — Z7982 Long term (current) use of aspirin: Secondary | ICD-10-CM | POA: Diagnosis not present

## 2018-11-11 DIAGNOSIS — I252 Old myocardial infarction: Secondary | ICD-10-CM

## 2018-11-11 DIAGNOSIS — B20 Human immunodeficiency virus [HIV] disease: Secondary | ICD-10-CM

## 2018-11-11 DIAGNOSIS — Z79899 Other long term (current) drug therapy: Secondary | ICD-10-CM | POA: Diagnosis not present

## 2018-11-11 DIAGNOSIS — Z955 Presence of coronary angioplasty implant and graft: Secondary | ICD-10-CM

## 2018-11-11 DIAGNOSIS — I2510 Atherosclerotic heart disease of native coronary artery without angina pectoris: Secondary | ICD-10-CM

## 2018-11-11 DIAGNOSIS — R85612 Low grade squamous intraepithelial lesion on cytologic smear of anus (LGSIL): Secondary | ICD-10-CM | POA: Diagnosis not present

## 2018-11-11 DIAGNOSIS — N189 Chronic kidney disease, unspecified: Secondary | ICD-10-CM | POA: Diagnosis not present

## 2018-11-11 LAB — COMPREHENSIVE METABOLIC PANEL
ALT: 34 U/L (ref 0–44)
AST: 31 U/L (ref 15–41)
Albumin: 4.5 g/dL (ref 3.5–5.0)
Alkaline Phosphatase: 58 U/L (ref 38–126)
Anion gap: 10 (ref 5–15)
BUN: 27 mg/dL — ABNORMAL HIGH (ref 6–20)
CO2: 24 mmol/L (ref 22–32)
Calcium: 9.2 mg/dL (ref 8.9–10.3)
Chloride: 105 mmol/L (ref 98–111)
Creatinine, Ser: 1.47 mg/dL — ABNORMAL HIGH (ref 0.61–1.24)
GFR calc Af Amer: >60 mL/min (ref >60)
GFR calc non Af Amer: 52 mL/min — ABNORMAL LOW (ref >60)
Glucose, Bld: 94 mg/dL (ref 70–99)
Potassium: 4 mmol/L (ref 3.5–5.1)
Sodium: 139 mmol/L (ref 135–145)
Total Bilirubin: 1 mg/dL (ref 0.3–1.2)
Total Protein: 7 g/dL (ref 6.5–8.1)

## 2018-11-11 NOTE — Progress Notes (Signed)
NAME: Daniel Black  DOB: 1960/06/02  MRN: [id number]  Date/Time: 11/11/2018 9:26 AM   Subjective:   ?Follow-up visit for HIV. Daniel Black is a 58 y.o. male with a history of AIDS, CAD, STEMI status post stents is here for follow-up visit. He is on Biktarvy and 100% adherent.  No side effects.  Last viral load less than 20 from 05/13/2018   First visit to my clinic was 05/13/2018 He was followed by Dr. Ola Spurr before [facility] diagnosed 1984 Nadir Cd4 70 OI none HAARt history AZT and other drugs Atrial flutter Biktarvy  Acquired thru sex with men Genotype( genosure archive)  05/13/2018 ?RAMs*: K101K/R, K103K/N -resistant to efavirenz Past medical history Bell's palsy Degenerative disc disease Mixed hyperlipidemia Nephrolithiasis MI August 2019 had STEMI  Aug 12 th  2019 STEMI  Dist RCA lesion is 85% stenosed.  A drug-eluting stent was successfully placed using a STENT RESOLUTE ONYX 3.5X12.  Ost RPDA to RPDA lesion is 95% stenosed. A drug-eluting stent was successfully placed using a STENT RESOLUTE ONYX 2.5X15. Had Vfib and was shocked? 8/15 Successful angioplasty and drug-eluting stent placement to the mid left circumflex. A second stent was overlapped proximally due to suspected edge dissection that was covered successfully with a second stent.    Low testosterone   Past Surgical History:   . CHOLECYSTECTOMY  . FINGER  Left reattachment Colonoscopy with poly removal . LUMBAR FUSION   SH Former smoker Works in the [employer] with male partner who is HIV negative    Family History  Problem Relation Age of Onset  . Heart disease Father   . Kidney disease Father   . Cancer Father   . Hearing loss Father   . Hypertension Father   . Stroke Father   . Vision loss Father   . Coronary artery disease Father   . Varicose Veins Father   . Hypertension Mother   . Hearing loss Mother   . Heart disease Mother   . Coronary artery disease Other         family hx   No Known Allergies ? Current Outpatient Medications  Medication Sig Dispense Refill  . aspirin 81 MG chewable tablet Chew 1 tablet (81 mg total) by mouth daily.    . bictegravir-emtricitabine-tenofovir AF (BIKTARVY) 50-200-25 MG TABS tablet Take 1 tablet by mouth daily. 30 tablet 2  . ezetimibe (ZETIA) 10 MG tablet Take 1 tablet (10 mg total) by mouth daily. 90 tablet 3  . isosorbide mononitrate (IMDUR) 30 MG 24 hr tablet Take 1 tablet (30 mg total) by mouth daily. 90 tablet 3  . Multiple Vitamin (MULTIVITAMIN) tablet Take 1 tablet by mouth daily.    . rosuvastatin (CRESTOR) 40 MG tablet Take 1 tablet (40 mg total) by mouth daily. 90 tablet 3  . ticagrelor (BRILINTA) 90 MG TABS tablet Take 1 tablet (90 mg total) by mouth 2 (two) times daily. (Patient taking differently: Take 90 mg by mouth 2 (two) times daily. Dr Rockey Situ) 180 tablet 3   No current facility-administered medications for this visit.     REVIEW OF SYSTEMS:  Const: negative fever, negative chills, negative weight loss Eyes: negative diplopia or visual changes, negative eye pain ENT: negative coryza, negative sore throat Resp: negative cough, hemoptysis, positive for dyspnea on walking fast Cards: negative for chest pain, positive for palpitations, lower extremity edema GU: negative for frequency, dysuria and hematuria Skin: negative for rash and pruritus Heme: Easy bleeding MS: negative for myalgias,  arthralgias, back pain and muscle weakness Neurolo:negative for headaches, dizziness, vertigo, memory problems  Psych: negative for feelings of anxiety, depression   Objective:  VITALS:  BP 115/74 (BP Location: Left Arm, Patient Position: Sitting, Cuff Size: Normal)   Pulse 80   Temp 97.6 F (36.4 C) (Oral)   Ht 5\' 10"  (1.778 m)   Wt 194 lb (88 kg)   BMI 27.84 kg/m  PHYSICAL EXAM:  General: Alert, cooperative, no distress, appears stated age.  Head: Normocephalic, without obvious abnormality,  atraumatic. Eyes: Conjunctivae clear, anicteric sclerae. Pupils are equal Nose: Nares normal. No drainage or sinus tenderness. Throat: Lips, mucosa, and tongue normal. No Thrush Neck: Supple, symmetrical, no adenopathy, thyroid: non tender no carotid bruit and no JVD. Back: No CVA tenderness. Lungs: Clear to auscultation bilaterally. No Wheezing or Rhonchi. No rales. Heart: Regular rate and rhythm, no murmur, rub or gallop. Abdomen: Soft, non-tender,not distended. Bowel sounds normal. No masses Extremities: Extremities normal, atraumatic, no cyanosis. No edema. No clubbing Skin: No rashes or lesions. Not Jaundiced Lymph: Cervical, supraclavicular normal. Neurologic: Grossly non-focal External examination of the anal area.  No warts seen no hemorrhoids. Anal Pap done  Pertinent Labs {Health maintenance Vaccination  Vaccine Date last given comment  Influenza    Hepatitis B    Hepatitis A    Prevnar-PCV-13    Pneumovac-PPSV-23    TdaP    HPV    Shingrix ( zoster vaccine)     ______________________  Labs Lab Result  Date comment  HIV VL <20  05/13/2018   CD4     Genotype  K103N  05/13/2018   [id number]  nonreactive  05/13/2018   HIV antibody     RPR  NR  05/13/2018   Quantiferon Gold  negative  05/13/2018   Hep C ab  nonreactive  05/13/2018   Hepatitis B-ab,ag,c Neg  05/13/2018  did not seroconvert after vaccination  Hepatitis A-IgM, IgG /T  IgM negative  05/13/2018   Lipid  91/40/38/66  05/13/2018   GC/CHL     PAP     HB,PLT,Cr, LFT       Preventive  Procedure Result  Date comment  colonoscopy   06/13/2016        Dental exam     Opthal       Impression/Recommendation ??HIV/AIDS- diagnosed in 1984 with nadir cd4 being 70. On biktarvy-100% adherent. Vl < 20 and cd4 is 616- will send labs today-Vl, CD$, RPR, quantiferon gold,   CAD - strong FH- has multiple stents- had STEMI in Aug 2019. - on tigacrelor, aspirin ?  CKD- last cr from 2/5 was 1.30 ( better) says he took upto 5g/day  of motrin in the past which made him have CKD Watch closely as on Tenofovir-  Genotype archive  Has only 103K mutation which is resistance to efavirenz - we can do a non tenofovir regimen- I am not inclined to use abacavir because of CAD. May do a dual therapy- will look into this further after today's cr  Health maintenance  updated. He will get his vaccination record No tires to hepb inspite of vaccination- can do heplisav    Anal PAP done today  ?follow up 6 months or earlier ? ___________________________________________________ Discussed with patient, requesting provider

## 2018-11-11 NOTE — Addendum Note (Signed)
Addended by: Santiago Bur on: 11/11/2018 11:23 AM   Modules accepted: Orders

## 2018-11-11 NOTE — Patient Instructions (Signed)
You are here for follow up- your last vl , 20 . Today we will check your cr and if still up we can think of a tenofovir free regimen which would be Dovato- dolutegravir + FTC. Today you had anal pap-

## 2018-11-12 LAB — T-HELPER CELLS CD4/CD8 %
% CD 4 Pos. Lymph.: 25.6 % — ABNORMAL LOW (ref 30.8–58.5)
Absolute CD 4 Helper: 614 /uL (ref 359–1519)
Basophils Absolute: 0 10*3/uL (ref 0.0–0.2)
Basos: 0 %
CD3+CD4+ Cells/CD3+CD8+ Cells Bld: 0.4 — ABNORMAL LOW (ref 0.92–3.72)
CD3+CD8+ Cells # Bld: 1522 /uL — ABNORMAL HIGH (ref 109–897)
CD3+CD8+ Cells NFr Bld: 63.4 % — ABNORMAL HIGH (ref 12.0–35.5)
EOS (ABSOLUTE): 0.1 10*3/uL (ref 0.0–0.4)
Eos: 2 %
Hematocrit: 44.4 % (ref 37.5–51.0)
Hemoglobin: 14.9 g/dL (ref 13.0–17.7)
Immature Grans (Abs): 0 10*3/uL (ref 0.0–0.1)
Immature Granulocytes: 0 %
Lymphocytes Absolute: 2.4 10*3/uL (ref 0.7–3.1)
Lymphs: 44 %
MCH: 32 pg (ref 26.6–33.0)
MCHC: 33.6 g/dL (ref 31.5–35.7)
MCV: 96 fL (ref 79–97)
Monocytes Absolute: 0.4 10*3/uL (ref 0.1–0.9)
Monocytes: 7 %
Neutrophils Absolute: 2.5 10*3/uL (ref 1.4–7.0)
Neutrophils: 47 %
Platelets: 190 10*3/uL (ref 150–450)
RBC: 4.65 x10E6/uL (ref 4.14–5.80)
RDW: 13.9 % (ref 11.6–15.4)
WBC: 5.3 10*3/uL (ref 3.4–10.8)

## 2018-11-12 LAB — HIV-1 RNA QUANT-NO REFLEX-BLD
HIV 1 RNA Quant: 50 copies/mL
LOG10 HIV-1 RNA: 1.699 log10copy/mL

## 2018-11-26 LAB — CYTOLOGY - NON PAP

## 2018-11-29 ENCOUNTER — Other Ambulatory Visit: Payer: Self-pay | Admitting: Cardiovascular Disease

## 2018-12-01 NOTE — Telephone Encounter (Signed)
Would consider staying on asa 81 with lower dose brilinta, He may need another commercial pay coupon card

## 2018-12-07 MED FILL — BIKTARVY 50-200-25 MG TABS: 50-200-25 | 30 days supply | Qty: 30 | Fill #3

## 2018-12-09 ENCOUNTER — Other Ambulatory Visit: Payer: Self-pay | Admitting: *Deleted

## 2018-12-09 MED ORDER — TICAGRELOR 60 MG PO TABS: 60.0000 mg | ORAL_TABLET | Freq: Two times a day (BID) | ORAL | 3 refills | Status: DC

## 2018-12-30 MED FILL — BIKTARVY 50-200-25 MG TABS: 50-200-25 | 30 days supply | Qty: 30 | Fill #4

## 2019-01-27 MED FILL — BIKTARVY 50-200-25 MG TABS: 50-200-25 | 30 days supply | Qty: 30 | Fill #5

## 2019-02-01 ENCOUNTER — Ambulatory Visit: Payer: 59 | Admitting: Family Medicine

## 2019-02-08 ENCOUNTER — Encounter: Payer: Self-pay | Admitting: Family Medicine

## 2019-02-08 ENCOUNTER — Other Ambulatory Visit: Payer: Self-pay

## 2019-02-08 ENCOUNTER — Ambulatory Visit (INDEPENDENT_AMBULATORY_CARE_PROVIDER_SITE_OTHER): Payer: 59 | Admitting: Family Medicine

## 2019-02-08 VITALS — BP 130/70 | HR 80 | Ht 70.0 in | Wt 200.0 lb

## 2019-02-08 DIAGNOSIS — M7918 Myalgia, other site: Secondary | ICD-10-CM | POA: Diagnosis not present

## 2019-02-08 DIAGNOSIS — M5412 Radiculopathy, cervical region: Secondary | ICD-10-CM

## 2019-02-08 MED ORDER — MELOXICAM 15 MG PO TABS: 15.0000 mg | ORAL_TABLET | Freq: Every day | ORAL | 1 refills | Status: DC

## 2019-02-08 MED ORDER — PREDNISONE 10 MG PO TABS: 10.0000 mg | ORAL_TABLET | Freq: Every day | ORAL | 0 refills | Status: DC

## 2019-02-08 NOTE — Patient Instructions (Addendum)
Cervical Radiculopathy  Cervical radiculopathy happens when a nerve in the neck (a cervical nerve) is pinched or bruised. This condition can happen because of an injury to the cervical spine (vertebrae) in the neck, or as part of the normal aging process. Pressure on the cervical nerves can cause pain or numbness that travels from the neck all the way down into the arm and fingers. Usually, this condition gets better with rest. Treatment may be needed if the condition does not improve. What are the causes? This condition may be caused by:  A neck injury.  A bulging (herniated) disk.  Muscle spasms.  Muscle tightness in the neck because of overuse.  Arthritis.  Breakdown or degeneration in the bones and joints of the spine (spondylosis) due to aging.  Bone spurs that may develop near the cervical nerves. What are the signs or symptoms? Symptoms of this condition include:  Pain. The pain may travel from the neck to the arm and hand. The pain can be severe or irritating. It may be worse when you move your neck.  Numbness or tingling in your arm or hand.  Weakness in the affected arm and hand, in severe cases. How is this diagnosed? This condition may be diagnosed based on your symptoms, your medical history, and a physical exam. You may also have tests, including:  X-rays.  A CT scan.  An MRI.  An electromyogram (EMG).  Nerve conduction tests. How is this treated? In many cases, treatment is not needed for this condition. With rest, the condition usually gets better over time. If treatment is needed, options may include:  Wearing a soft neck collar (cervical collar) for short periods of time, as told by your health care provider.  Doing physical therapy to strengthen your neck muscles.  Taking medicines, such as NSAIDs or oral corticosteroids.  Having spinal injections, in severe cases.  Having surgery. This may be needed if other treatments do not help. Different types  of surgery may be done depending on the cause of this condition. Follow these instructions at home: If you have a cervical collar:  Wear it as told by your health care provider. Remove it only as told by your health care provider.  Ask your health care provider if you can remove the collar for cleaning and bathing. If you are allowed to remove the collar for cleaning or bathing: ? Follow instructions from your health care provider about how to remove the collar safely. ? Clean the collar by wiping it with mild soap and water and drying it completely. ? Take out any removable pads in the collar every 1-2 days, and wash them by hand with soap and water. Let them air-dry completely before you put them back in the collar. ? Check your skin under the collar for irritation or sores. If you see any, tell your health care provider. Managing pain      Take over-the-counter and prescription medicines only as told by your health care provider.  If directed, put ice on the affected area. ? If you have a soft neck collar, remove it as told by your health care provider. ? Put ice in a plastic bag. ? Place a towel between your skin and the bag. ? Leave the ice on for 20 minutes, 2-3 times a day.  If applying ice does not help, you can try using heat. Use the heat source that your health care provider recommends, such as a moist heat pack or a heating pad. ?  Place a towel between your skin and the heat source. ? Leave the heat on for 20-30 minutes. ? Remove the heat if your skin turns bright red. This is especially important if you are unable to feel pain, heat, or cold. You may have a greater risk of getting burned.  Try a gentle neck and shoulder massage to help relieve symptoms. Activity  Rest as needed.  Return to your normal activities as told by your health care provider. Ask your health care provider what activities are safe for you.  Do stretching and strengthening exercises as told by  your health care provider or physical therapist.  Do not lift anything that is heavier than 10 lb (4.5 kg) until your health care provider tells you that it is safe. General instructions  Use a flat pillow when you sleep.  Do not drive while wearing a cervical collar. If you do not have a cervical collar, ask your health care provider if it is safe to drive while your neck heals.  Ask your health care provider if the medicine prescribed to you requires you to avoid driving or using heavy machinery.  Do not use any products that contain nicotine or tobacco, such as cigarettes, e-cigarettes, and chewing tobacco. These can delay healing. If you need help quitting, ask your health care provider.  Keep all follow-up visits as told by your health care provider. This is important. Contact a health care provider if:  Your condition does not improve with treatment. Get help right away if:  Your pain gets much worse and cannot be controlled with medicines.  You have weakness or numbness in your hand, arm, face, or leg.  You have a high fever.  You have a stiff, rigid neck.  You lose control of your bowels or your bladder (have incontinence).  You have trouble with walking, balance, or speaking. Summary  Cervical radiculopathy happens when a nerve in the neck is pinched or bruised.  A nerve can get pinched from a bulging disk, arthritis, muscle spasms, or an injury to the neck.  Symptoms include pain, tingling, or numbness radiating from the neck into the arm or hand. Weakness can also occur in severe cases.  Treatment may include rest, wearing a cervical collar, and physical therapy. Medicines may be prescribed to help with pain. In severe cases, injections or surgery may be needed. This information is not intended to replace advice given to you by your health care provider. Make sure you discuss any questions you have with your health care provider. Document Released: 12/17/2000  Document Revised: 02/12/2018 Document Reviewed: 02/12/2018 Elsevier Patient Education  2020 [employer].    Why follow it? Research shows. . Those who follow the Mediterranean diet have a reduced risk of heart disease  . The diet is associated with a reduced incidence of Parkinson's and Alzheimer's diseases . People following the diet may have longer life expectancies and lower rates of chronic diseases  . The Dietary Guidelines for Americans recommends the Mediterranean diet as an eating plan to promote health and prevent disease  What Is the Mediterranean Diet?  . Healthy eating plan based on typical foods and recipes of Mediterranean-style cooking . The diet is primarily a plant based diet; these foods should make up a majority of meals   Starches - Plant based foods should make up a majority of meals - They are an important sources of vitamins, minerals, energy, antioxidants, and fiber - Choose whole grains, foods high in fiber  and minimally processed items  - Typical grain sources include wheat, oats, barley, corn, brown rice, bulgar, farro, millet, polenta, couscous  - Various types of beans include chickpeas, lentils, fava beans, black beans, white beans   Fruits  Veggies - Large quantities of antioxidant rich fruits & veggies; 6 or more servings  - Vegetables can be eaten raw or lightly drizzled with oil and cooked  - Vegetables common to the traditional Mediterranean Diet include: artichokes, arugula, beets, broccoli, brussel sprouts, cabbage, carrots, celery, collard greens, cucumbers, eggplant, kale, leeks, lemons, lettuce, mushrooms, okra, onions, peas, peppers, potatoes, pumpkin, radishes, rutabaga, shallots, spinach, sweet potatoes, turnips, zucchini - Fruits common to the Mediterranean Diet include: apples, apricots, avocados, cherries, clementines, dates, figs, grapefruits, grapes, melons, nectarines, oranges, peaches, pears, pomegranates, strawberries, tangerines  Fats -  Replace butter and margarine with healthy oils, such as olive oil, canola oil, and tahini  - Limit nuts to no more than a handful a day  - Nuts include walnuts, almonds, pecans, pistachios, pine nuts  - Limit or avoid candied, honey roasted or heavily salted nuts - Olives are central to the [employer] - can be eaten whole or used in a variety of dishes   Meats Protein - Limiting red meat: no more than a few times a month - When eating red meat: choose lean cuts and keep the portion to the size of deck of cards - Eggs: approx. 0 to 4 times a week  - Fish and lean poultry: at least 2 a week  - Healthy protein sources include, chicken, [place], lean beef, lamb - Increase intake of seafood such as tuna, salmon, trout, mackerel, shrimp, scallops - Avoid or limit high fat processed meats such as sausage and bacon  Dairy - Include moderate amounts of low fat dairy products  - Focus on healthy dairy such as fat free yogurt, skim milk, low or reduced fat cheese - Limit dairy products higher in fat such as whole or 2% milk, cheese, ice cream  Alcohol - Moderate amounts of red wine is ok  - No more than 5 oz daily for women (all ages) and men older than age 35  - No more than 10 oz of wine daily for men younger than 48  Other - Limit sweets and other desserts  - Use herbs and spices instead of salt to flavor foods  - Herbs and spices common to the traditional Mediterranean Diet include: basil, bay leaves, chives, cloves, cumin, fennel, garlic, lavender, marjoram, mint, oregano, parsley, pepper, rosemary, sage, savory, sumac, tarragon, thyme   It's not just a diet, it's a lifestyle:  . The Mediterranean diet includes lifestyle factors typical of those in the region  . Foods, drinks and meals are best eaten with others and savored . Daily physical activity is important for overall good health . This could be strenuous exercise like running and aerobics . This could also be more leisurely  activities such as walking, housework, yard-work, or taking the stairs . Moderation is the key; a balanced and healthy diet accommodates most foods and drinks . Consider portion sizes and frequency of consumption of certain foods   Meal Ideas & Options:  . Breakfast:  o Whole wheat toast or whole wheat English muffins with peanut butter & hard boiled egg o Steel cut oats topped with apples & cinnamon and skim milk  o Fresh fruit: banana, strawberries, melon, berries, peaches  o Smoothies: strawberries, bananas, greek yogurt, peanut butter o Low fat  greek yogurt with blueberries and granola  o Egg white omelet with spinach and mushrooms o Breakfast couscous: whole wheat couscous, apricots, skim milk, cranberries  . Sandwiches:  o Hummus and grilled vegetables (peppers, zucchini, squash) on whole wheat bread   o Grilled chicken on whole wheat pita with lettuce, tomatoes, cucumbers or tzatziki  o Tuna salad on whole wheat bread: tuna salad made with greek yogurt, olives, red peppers, capers, green onions o Garlic rosemary lamb pita: lamb sauted with garlic, rosemary, salt & pepper; add lettuce, cucumber, greek yogurt to pita - flavor with lemon juice and black pepper  . Seafood:  o Mediterranean grilled salmon, seasoned with garlic, basil, parsley, lemon juice and black pepper o Shrimp, lemon, and spinach whole-grain pasta salad made with low fat greek yogurt  o Seared scallops with lemon orzo  o Seared tuna steaks seasoned salt, pepper, coriander topped with tomato mixture of olives, tomatoes, olive oil, minced garlic, parsley, green onions and cappers  . Meats:  o Herbed greek chicken salad with kalamata olives, cucumber, feta  o Red bell peppers stuffed with spinach, bulgur, lean ground beef (or lentils) & topped with feta   o Kebabs: skewers of chicken, tomatoes, onions, zucchini, squash  o [place] burgers: made with red onions, mint, dill, lemon juice, feta cheese topped with roasted red  peppers . Vegetarian o Cucumber salad: cucumbers, artichoke hearts, celery, red onion, feta cheese, tossed in olive oil & lemon juice  o Hummus and whole grain pita points with a greek salad (lettuce, tomato, feta, olives, cucumbers, red onion) o Lentil soup with celery, carrots made with vegetable broth, garlic, salt and pepper  o Tabouli salad: parsley, bulgur, mint, scallions, cucumbers, tomato, radishes, lemon juice, olive oil, salt and pepper.

## 2019-02-08 NOTE — Progress Notes (Signed)
Date:  02/08/2019   Name:  Daniel Black   DOB:  [birth year]   MRN:  [id number]   Chief Complaint: Shoulder Pain (L) shoulder pain- 6/10 pain scale- hurts to sleep on that side. Sharp, stabbing pain- Aleve helps some)  Shoulder Pain  The pain is present in the left shoulder. This is a new problem. The current episode started 1 to 4 weeks ago (weeks). The problem occurs daily. The problem has been unchanged. Quality: pinching. The pain is moderate. Pertinent negatives include no fever, inability to bear weight, joint locking, joint swelling, limited range of motion, numbness, stiffness or tingling. The symptoms are aggravated by lying down (certain positions). He has tried NSAIDS for the symptoms. The treatment provided mild relief. hx of neck pain    Review of Systems  Constitutional: Negative for chills and fever.  HENT: Negative for drooling, ear discharge, ear pain and sore throat.   Respiratory: Negative for cough, shortness of breath and wheezing.   Cardiovascular: Negative for chest pain, palpitations and leg swelling.  Gastrointestinal: Negative for abdominal pain, blood in stool, constipation, diarrhea and nausea.  Endocrine: Negative for polydipsia.  Genitourinary: Negative for dysuria, frequency, hematuria and urgency.  Musculoskeletal: Negative for back pain, myalgias, neck pain and stiffness.  Skin: Negative for rash.  Allergic/Immunologic: Negative for environmental allergies.  Neurological: Negative for dizziness, tingling, numbness and headaches.  Hematological: Does not bruise/bleed easily.  Psychiatric/Behavioral: Negative for suicidal ideas. The patient is not nervous/anxious.     Patient Active Problem List   Diagnosis Date Noted  . Acute ST elevation myocardial infarction (STEMI) of inferior wall ([place]) 11/16/2017  . CAD in native artery 11/16/2017  . AKI (acute kidney injury) ([place]) 11/16/2017  . Special screening for malignant neoplasms, colon   . Benign  neoplasm of ascending colon   . Human immunodeficiency virus (HIV) disease ([place]) 11/25/2009  . Hyperlipidemia 11/23/2009  . Coronary atherosclerosis 11/23/2009  . Chest pain 11/23/2009    No Known Allergies  Past Surgical History:  Procedure Laterality Date  . AMPUTATION  1985   L fingers reattachement   . back fusion     lumbar  . CARDIAC CATHETERIZATION     x2. 2004, 2010. 3 stents(total)  . CHOLECYSTECTOMY  3/03  . COLONOSCOPY WITH PROPOFOL N/A 06/13/2016   Procedure: COLONOSCOPY WITH PROPOFOL;  Surgeon: Lucilla Lame, MD;  Location: [facility];  Service: Endoscopy;  Laterality: N/A;  . CORONARY/GRAFT ACUTE MI REVASCULARIZATION N/A 11/16/2017   Procedure: Coronary/Graft Acute MI Revascularization;  Surgeon: [name], MD;  Location: [facility];  Service: Cardiovascular;  Laterality: N/A;  . LEFT HEART CATH AND CORONARY ANGIOGRAPHY N/A 11/16/2017   Procedure: LEFT HEART CATH AND CORONARY ANGIOGRAPHY;  Surgeon: [name], MD;  Location: [facility];  Service: Cardiovascular;  Laterality: N/A;  . LEFT HEART CATH AND CORONARY ANGIOGRAPHY N/A 11/19/2017   Procedure: LEFT HEART CATH AND CORONARY ANGIOGRAPHY;  Surgeon: [name], MD;  Location: [facility];  Service: Cardiovascular;  Laterality: N/A;  . POLYPECTOMY  06/13/2016   Procedure: POLYPECTOMY;  Surgeon: Lucilla Lame, MD;  Location: [facility];  Service: Endoscopy;;  . WRIST SURGERY  10/2011    Social History   Tobacco Use  . Smoking status: Former Smoker    Quit date: 04/07/2010    Years since quitting: 8.8  . Smokeless tobacco: Never Used  Substance Use Topics  . Alcohol use: No  Comment: occasional (1x/mo)  . Drug use: No     Medication list has been reviewed and updated.  Current Meds  Medication Sig  . aspirin 81 MG chewable tablet Chew 1 tablet (81 mg total) by mouth daily.  . bictegravir-emtricitabine-tenofovir AF (BIKTARVY) 50-200-25 MG TABS  tablet Take 1 tablet by mouth daily.  Marland Kitchen ezetimibe (ZETIA) 10 MG tablet Take 1 tablet (10 mg total) by mouth daily.  . isosorbide mononitrate (IMDUR) 30 MG 24 hr tablet Take 1 tablet (30 mg total) by mouth daily.  . Multiple Vitamin (MULTIVITAMIN) tablet Take 1 tablet by mouth daily.  . rosuvastatin (CRESTOR) 40 MG tablet Take 1 tablet (40 mg total) by mouth daily.  . ticagrelor (BRILINTA) 60 MG TABS tablet Take 1 tablet (60 mg total) by mouth 2 (two) times daily. Dr Rockey Situ    [facility] 2/9 Scores 02/08/2019 05/12/2018 04/29/2018  PHQ - 2 Score 0 0 0  PHQ- 9 Score 0 0 5    BP Readings from Last 3 Encounters:  02/08/19 130/70  11/11/18 115/74  10/05/18 114/80    Physical Exam Vitals signs and nursing note reviewed.  HENT:     Head: Normocephalic.     Right Ear: External ear normal.     Left Ear: External ear normal.     Nose: Nose normal.  Eyes:     General: No scleral icterus.       Right eye: No discharge.        Left eye: No discharge.     Conjunctiva/sclera: Conjunctivae normal.     Pupils: Pupils are equal, round, and reactive to light.  Neck:     Musculoskeletal: Normal range of motion and neck supple.     Thyroid: No thyromegaly.     Vascular: No JVD.     Trachea: No tracheal deviation.  Cardiovascular:     Rate and Rhythm: Normal rate and regular rhythm.     Heart sounds: Normal heart sounds. No murmur. No friction rub. No gallop.   Pulmonary:     Effort: No respiratory distress.     Breath sounds: Normal breath sounds. No wheezing or rales.  Abdominal:     General: Bowel sounds are normal.     Palpations: Abdomen is soft. There is no mass.     Tenderness: There is no abdominal tenderness. There is no guarding or rebound.  Musculoskeletal: Normal range of motion.        General: No tenderness.     Left shoulder: He exhibits pain. He exhibits normal range of motion, no tenderness, no bony tenderness, no swelling, no spasm and normal strength.  Lymphadenopathy:      Cervical: No cervical adenopathy.  Skin:    General: Skin is warm.     Findings: No rash.  Neurological:     Mental Status: He is alert and oriented to person, place, and time.     Cranial Nerves: No cranial nerve deficit.     Sensory: Sensation is intact.     Motor: Motor function is intact.     Deep Tendon Reflexes: Reflexes are normal and symmetric.     Reflex Scores:      Tricep reflexes are 2+ on the right side and 2+ on the left side.      Bicep reflexes are 2+ on the right side and 2+ on the left side.      Brachioradialis reflexes are 2+ on the right side and 2+ on the left side.  Wt Readings from Last 3 Encounters:  02/08/19 200 lb (90.7 kg)  11/11/18 194 lb (88 kg)  10/05/18 190 lb 12 oz (86.5 kg)    BP 130/70   Pulse 80   Ht 5\' 10"  (1.778 m)   Wt 200 lb (90.7 kg)   BMI 28.70 kg/m   Assessment and Plan: 1. Rhomboid pain Rather doubtful that the area is a rhomboid strain given that there is no tenderness but it is in the general vicinity of the rhomboid perhaps teres group.  Will initiate meloxicam 15 mg once a day as well as a tapering dose of prednisone 20 mg for a week followed by 10 mg/week.  2. Cervical radiculopathy Patient symptoms and description of the pain with positioning of the neck is suggestive of cervical radiculopathy.  We will initiate meloxicam 15 mg once a day followed by a prednisone taper.  If patient continues to have discomfort as well as it does not completely go away we will refer to orthopedics for evaluation and possible injection.

## 2019-02-22 ENCOUNTER — Other Ambulatory Visit: Payer: Self-pay | Admitting: Infectious Diseases

## 2019-02-22 MED ORDER — BIKTARVY 50-200-25 MG PO TABS: 1.0000 | ORAL_TABLET | Freq: Every day | ORAL | 2 refills | Status: DC

## 2019-03-01 MED FILL — BIKTARVY 50-200-25 MG TABS: 50-200-25 | 30 days supply | Qty: 30 | Fill #0

## 2019-04-04 MED FILL — BIKTARVY 50-200-25 MG TABS: 50-200-25 | 30 days supply | Qty: 30 | Fill #1

## 2019-04-09 ENCOUNTER — Encounter: Payer: Self-pay | Admitting: Family Medicine

## 2019-04-11 ENCOUNTER — Other Ambulatory Visit: Payer: Self-pay

## 2019-04-11 DIAGNOSIS — I2510 Atherosclerotic heart disease of native coronary artery without angina pectoris: Secondary | ICD-10-CM

## 2019-04-11 DIAGNOSIS — B20 Human immunodeficiency virus [HIV] disease: Secondary | ICD-10-CM

## 2019-04-11 NOTE — Progress Notes (Unsigned)
amb cardio and inf disease due to new insurance

## 2019-04-12 MED ORDER — ISOSORBIDE MONONITRATE ER 30 MG PO TB24: 30.0000 mg | ORAL_TABLET | Freq: Every day | ORAL | 3 refills | Status: DC

## 2019-04-19 NOTE — Progress Notes (Signed)
Cardiology Office Note  Date:  04/26/2019   ID:  Daniel Black, DOB [birth year], MRN [id number]  PCP:  Juline Patch, MD   Chief Complaint  Patient presents with  . Follow-up    Pt. chest pain and shortness of breath with rapid heart beats. Meds reviewed by the pt. verbally.     HPI:  Daniel Black is a very pleasant 59 year old gentleman with  remote smoking history,  coronary artery disease,  MI in 2004 with PCI to the LAD and mid RCA at that time,  PCI in 2010 of the mid RCA lesion,  hyperlipidemia, HIV who is on antivirals   who presents for routine follow up of his coronary artery disease.  Rare atypical chest pain Exercising  treadmill , 1 hr 30 min walk  On Lipitor and Zetia On aspirin and brilinta  Total chol 90, LDL 38  Heart rate 40 to 50 at night, not on b-blocker   STEMI 11/2017 Stent to the distal RCA and posterior AV groove segment Status post staged PCI to the LCx  11/19/2017., second stent placed for suspected edge dissection   EKG personally reviewed by myself on todays visit Shows normal sinus rhythm rate 75 bpm no significant ST abnormality he has T-wave inversions inferior leads  Other past medical history  Cardiac catheterization report from April 2010 details patent LAD stent, 30% distal LAD, 25% proximal left circumflex, 20% mid circumflex, 95% mid RCA, 40% distal RCA, 30% right PDA. Xience 3.5 x 28 mm DES stent placed to the mid RCA.     PMH:   has a past medical history of Asymptomatic HIV infection ([place]), Bell's palsy (2004), CAD (coronary artery disease), DDD (degenerative disc disease) (10/02), Erectile dysfunction, HLD (hyperlipidemia), Myocardial infarction ([place]) (2004), Nephrolithiasis, and Skin cancer, basal cell.  PSH:    Past Surgical History:  Procedure Laterality Date  . AMPUTATION  1985   L fingers reattachement   . back fusion     lumbar  . CARDIAC CATHETERIZATION     x2. 2004, 2010. 3 stents(total)  . CHOLECYSTECTOMY   3/03  . COLONOSCOPY WITH PROPOFOL N/A 06/13/2016   Procedure: COLONOSCOPY WITH PROPOFOL;  Surgeon: Lucilla Lame, MD;  Location: [facility];  Service: Endoscopy;  Laterality: N/A;  . CORONARY/GRAFT ACUTE MI REVASCULARIZATION N/A 11/16/2017   Procedure: Coronary/Graft Acute MI Revascularization;  Surgeon: [name], MD;  Location: [facility];  Service: Cardiovascular;  Laterality: N/A;  . LEFT HEART CATH AND CORONARY ANGIOGRAPHY N/A 11/16/2017   Procedure: LEFT HEART CATH AND CORONARY ANGIOGRAPHY;  Surgeon: [name], MD;  Location: [facility];  Service: Cardiovascular;  Laterality: N/A;  . LEFT HEART CATH AND CORONARY ANGIOGRAPHY N/A 11/19/2017   Procedure: LEFT HEART CATH AND CORONARY ANGIOGRAPHY;  Surgeon: [name], MD;  Location: [facility];  Service: Cardiovascular;  Laterality: N/A;  . POLYPECTOMY  06/13/2016   Procedure: POLYPECTOMY;  Surgeon: Lucilla Lame, MD;  Location: [facility];  Service: Endoscopy;;  . WRIST SURGERY  10/2011    Current Outpatient Medications  Medication Sig Dispense Refill  . aspirin 81 MG chewable tablet Chew 1 tablet (81 mg total) by mouth daily.    . bictegravir-emtricitabine-tenofovir AF (BIKTARVY) 50-200-25 MG TABS tablet Take 1 tablet by mouth daily. 30 tablet 2  . ezetimibe (ZETIA) 10 MG tablet Take 1 tablet (10 mg total) by mouth daily. 90 tablet 3  . isosorbide mononitrate (IMDUR) 30 MG 24 hr  tablet Take 1 tablet (30 mg total) by mouth daily. 90 tablet 3  . Multiple Vitamin (MULTIVITAMIN) tablet Take 1 tablet by mouth daily.    . rosuvastatin (CRESTOR) 40 MG tablet Take 1 tablet (40 mg total) by mouth daily. 90 tablet 3  . ticagrelor (BRILINTA) 60 MG TABS tablet Take 1 tablet (60 mg total) by mouth 2 (two) times daily. Dr Rockey Situ 180 tablet 3   No current facility-administered medications for this visit.     Allergies:   Patient has no known allergies.   Social History:  The patient   reports that he quit smoking about 9 years ago. He has never used smokeless tobacco. He reports that he does not drink alcohol or use drugs.   Family History:   family history includes Cancer in his father; Coronary artery disease in his father and another family member; Hearing loss in his father and mother; Heart disease in his father and mother; Hypertension in his father and mother; Kidney disease in his father; Stroke in his father; Varicose Veins in his father; Vision loss in his father.   Review of Systems: Review of Systems  Constitutional: Negative.   HENT: Negative.   Respiratory: Negative.   Cardiovascular: Negative.   Gastrointestinal: Negative.   Musculoskeletal: Negative.   Neurological: Negative.   Psychiatric/Behavioral: Negative.   All other systems reviewed and are negative.   PHYSICAL EXAM: VS:  BP 136/70 (BP Location: Left Arm, Patient Position: Sitting, Cuff Size: Normal)   Pulse 75   Ht 5\' 10"  (1.778 m)   Wt 196 lb (88.9 kg)   BMI 28.12 kg/m  , BMI Body mass index is 28.12 kg/m.  Constitutional:  oriented to person, place, and time. No distress.  HENT:  Head: Grossly normal Eyes:  no discharge. No scleral icterus.  Neck: No JVD, no carotid bruits  Cardiovascular: Regular rate and rhythm, no murmurs appreciated Pulmonary/Chest: Clear to auscultation bilaterally, no wheezes or rails Abdominal: Soft.  no distension.  no tenderness.  Musculoskeletal: Normal range of motion Neurological:  normal muscle tone. Coordination normal. No atrophy Skin: Skin warm and dry Psychiatric: normal affect, pleasant   Recent Labs: 11/11/2018: ALT 34; BUN 27; Creatinine, Ser 1.47; Hemoglobin 14.9; Platelets 190; Potassium 4.0; Sodium 139    Lipid Panel Lab Results  Component Value Date   CHOL 91 (L) 05/12/2018   HDL 40 05/12/2018   LDLCALC 38 05/12/2018   TRIG 66 05/12/2018      Wt Readings from Last 3 Encounters:  04/26/19 196 lb (88.9 kg)  02/08/19 200 lb (90.7  kg)  11/11/18 194 lb (88 kg)      ASSESSMENT AND PLAN:  Atherosclerosis of native coronary artery without angina pectoris, unspecified whether native or transplanted heart -  STEMI with VT Stent stent to distal RCA and mid left circumflex Atypical  Chest pain, discussed in detail  Mixed hyperlipidemia Continue Crestor and Zetia  Human immunodeficiency virus (HIV) disease (HCC) Stable, on antivirals Managed by ID    Total encounter time more than 25 minutes  Greater than 50% was spent in counseling and coordination of care with the patient   Disposition:   F/U  12 months   Orders Placed This Encounter  Procedures  . EKG 12-Lead     Signed, Esmond Plants, M.D., Ph.D. 04/26/2019  [place], [place]

## 2019-04-26 ENCOUNTER — Other Ambulatory Visit: Payer: Self-pay

## 2019-04-26 ENCOUNTER — Encounter: Payer: Self-pay | Admitting: Cardiovascular Disease

## 2019-04-26 ENCOUNTER — Ambulatory Visit: Payer: No Typology Code available for payment source | Admitting: Cardiovascular Disease

## 2019-04-26 VITALS — BP 136/70 | HR 75 | Ht 70.0 in | Wt 196.0 lb

## 2019-04-26 DIAGNOSIS — N1831 Chronic kidney disease, stage 3a: Secondary | ICD-10-CM

## 2019-04-26 DIAGNOSIS — E782 Mixed hyperlipidemia: Secondary | ICD-10-CM

## 2019-04-26 DIAGNOSIS — N289 Disorder of kidney and ureter, unspecified: Secondary | ICD-10-CM | POA: Diagnosis not present

## 2019-04-26 DIAGNOSIS — N183 Chronic kidney disease, stage 3 unspecified: Secondary | ICD-10-CM

## 2019-04-26 DIAGNOSIS — B20 Human immunodeficiency virus [HIV] disease: Secondary | ICD-10-CM | POA: Diagnosis not present

## 2019-04-26 DIAGNOSIS — I25118 Atherosclerotic heart disease of native coronary artery with other forms of angina pectoris: Secondary | ICD-10-CM | POA: Diagnosis not present

## 2019-04-26 NOTE — Patient Instructions (Signed)

## 2019-05-03 MED FILL — BIKTARVY 50-200-25 MG TABS: 50-200-25 | 30 days supply | Qty: 30 | Fill #2

## 2019-05-17 ENCOUNTER — Ambulatory Visit (INDEPENDENT_AMBULATORY_CARE_PROVIDER_SITE_OTHER): Payer: No Typology Code available for payment source | Admitting: Family Medicine

## 2019-05-17 ENCOUNTER — Other Ambulatory Visit: Payer: Self-pay

## 2019-05-17 ENCOUNTER — Encounter: Payer: Self-pay | Admitting: Family Medicine

## 2019-05-17 VITALS — BP 120/70 | HR 80 | Ht 70.0 in | Wt 196.0 lb

## 2019-05-17 DIAGNOSIS — Z0000 Encounter for general adult medical examination without abnormal findings: Secondary | ICD-10-CM

## 2019-05-17 DIAGNOSIS — R351 Nocturia: Secondary | ICD-10-CM | POA: Diagnosis not present

## 2019-05-17 DIAGNOSIS — E782 Mixed hyperlipidemia: Secondary | ICD-10-CM

## 2019-05-17 NOTE — Progress Notes (Signed)
Date:  05/17/2019   Name:  Daniel Black   DOB:  [birth year]   MRN:  [id number]   Chief Complaint: Annual Exam  Patient is a 59 year old male who presents for a comprehensive physical exam. The patient reports the following problems: none. Health maintenance has been reviewed up to date.   Lab Results  Component Value Date   CREATININE 1.47 (H) 11/11/2018   BUN 27 (H) 11/11/2018   NA 139 11/11/2018   K 4.0 11/11/2018   CL 105 11/11/2018   CO2 24 11/11/2018   Lab Results  Component Value Date   CHOL 91 (L) 05/12/2018   HDL 40 05/12/2018   LDLCALC 38 05/12/2018   TRIG 66 05/12/2018   CHOLHDL 2.3 05/12/2018   Lab Results  Component Value Date   TSH 4.546 (H) 11/20/2017   Lab Results  Component Value Date   HGBA1C 5.3 11/16/2017     Review of Systems  Constitutional: Negative for chills and fever.  HENT: Negative for drooling, ear discharge, ear pain and sore throat.   Respiratory: Negative for cough, shortness of breath and wheezing.   Cardiovascular: Negative for chest pain, palpitations and leg swelling.  Gastrointestinal: Negative for abdominal pain, blood in stool, constipation, diarrhea and nausea.  Endocrine: Negative for polydipsia.  Genitourinary: Negative for dysuria, frequency, hematuria and urgency.  Musculoskeletal: Negative for back pain, myalgias and neck pain.  Skin: Negative for rash.  Allergic/Immunologic: Negative for environmental allergies.  Neurological: Negative for dizziness and headaches.  Hematological: Does not bruise/bleed easily.  Psychiatric/Behavioral: Negative for suicidal ideas. The patient is not nervous/anxious.     Patient Active Problem List   Diagnosis Date Noted  . Acute ST elevation myocardial infarction (STEMI) of inferior wall ([place]) 11/16/2017  . CAD in native artery 11/16/2017  . AKI (acute kidney injury) ([place]) 11/16/2017  . Special screening for malignant neoplasms, colon   . Benign neoplasm of ascending colon     . Human immunodeficiency virus (HIV) disease ([place]) 11/25/2009  . Hyperlipidemia 11/23/2009  . Coronary atherosclerosis 11/23/2009  . Chest pain 11/23/2009    No Known Allergies  Past Surgical History:  Procedure Laterality Date  . AMPUTATION  1985   L fingers reattachement   . back fusion     lumbar  . CARDIAC CATHETERIZATION     x2. 2004, 2010. 3 stents(total)  . CHOLECYSTECTOMY  3/03  . COLONOSCOPY WITH PROPOFOL N/A 06/13/2016   Procedure: COLONOSCOPY WITH PROPOFOL;  Surgeon: Lucilla Lame, MD;  Location: [facility];  Service: Endoscopy;  Laterality: N/A;  . CORONARY/GRAFT ACUTE MI REVASCULARIZATION N/A 11/16/2017   Procedure: Coronary/Graft Acute MI Revascularization;  Surgeon: [name], MD;  Location: [facility];  Service: Cardiovascular;  Laterality: N/A;  . LEFT HEART CATH AND CORONARY ANGIOGRAPHY N/A 11/16/2017   Procedure: LEFT HEART CATH AND CORONARY ANGIOGRAPHY;  Surgeon: [name], MD;  Location: [facility];  Service: Cardiovascular;  Laterality: N/A;  . LEFT HEART CATH AND CORONARY ANGIOGRAPHY N/A 11/19/2017   Procedure: LEFT HEART CATH AND CORONARY ANGIOGRAPHY;  Surgeon: [name], MD;  Location: [facility];  Service: Cardiovascular;  Laterality: N/A;  . POLYPECTOMY  06/13/2016   Procedure: POLYPECTOMY;  Surgeon: Lucilla Lame, MD;  Location: [facility];  Service: Endoscopy;;  . WRIST SURGERY  10/2011    Social History   Tobacco Use  . Smoking status: Former Smoker    Quit date: 04/07/2010  Years since quitting: 9.1  . Smokeless tobacco: Never Used  Substance Use Topics  . Alcohol use: No    Comment: occasional (1x/mo)  . Drug use: No     Medication list has been reviewed and updated.  Current Meds  Medication Sig  . aspirin 81 MG chewable tablet Chew 1 tablet (81 mg total) by mouth daily.  . bictegravir-emtricitabine-tenofovir AF (BIKTARVY) 50-200-25 MG TABS tablet Take 1 tablet by mouth  daily.  Marland Kitchen ezetimibe (ZETIA) 10 MG tablet Take 1 tablet (10 mg total) by mouth daily.  . isosorbide mononitrate (IMDUR) 30 MG 24 hr tablet Take 1 tablet (30 mg total) by mouth daily.  . Multiple Vitamin (MULTIVITAMIN) tablet Take 1 tablet by mouth daily.  . rosuvastatin (CRESTOR) 40 MG tablet Take 1 tablet (40 mg total) by mouth daily.  . ticagrelor (BRILINTA) 60 MG TABS tablet Take 1 tablet (60 mg total) by mouth 2 (two) times daily. Dr Rockey Situ    [facility] 2/9 Scores 05/17/2019 02/08/2019 05/12/2018 04/29/2018  PHQ - 2 Score 0 0 0 0  PHQ- 9 Score 0 0 0 5    BP Readings from Last 3 Encounters:  05/17/19 120/70  04/26/19 136/70  02/08/19 130/70    Physical Exam Vitals and nursing note reviewed.  Constitutional:      Appearance: Normal appearance. He is well-groomed and overweight.  HENT:     Head: Normocephalic.     Jaw: There is normal jaw occlusion.     Right Ear: Tympanic membrane, ear canal and external ear normal.     Left Ear: Tympanic membrane, ear canal and external ear normal.     Nose: Nose normal.     Mouth/Throat:     Lips: Pink.     Mouth: Mucous membranes are moist.     Pharynx: Oropharynx is clear.  Eyes:     General: Lids are normal. Vision grossly intact. Gaze aligned appropriately. No visual field deficit or scleral icterus.       Right eye: No discharge.        Left eye: No discharge.     Extraocular Movements: Extraocular movements intact.     Conjunctiva/sclera: Conjunctivae normal.     Right eye: Right conjunctiva is not injected.     Pupils: Pupils are equal, round, and reactive to light.  Neck:     Thyroid: No thyroid mass, thyromegaly or thyroid tenderness.     Vascular: Normal carotid pulses. No carotid bruit, hepatojugular reflux or JVD.     Trachea: Trachea and phonation normal. No tracheal deviation.  Cardiovascular:     Rate and Rhythm: Normal rate and regular rhythm.     Pulses: Normal pulses. No decreased pulses.          Carotid pulses are 2+ on the  right side and 2+ on the left side.      Radial pulses are 2+ on the right side and 2+ on the left side.       Femoral pulses are 2+ on the right side and 2+ on the left side.      Popliteal pulses are 2+ on the right side and 2+ on the left side.       Dorsalis pedis pulses are 2+ on the right side and 2+ on the left side.       Posterior tibial pulses are 2+ on the right side and 2+ on the left side.     Heart sounds: Normal heart sounds and S1 normal.  No murmur. No systolic murmur. No diastolic murmur. No friction rub. No gallop. No S3 or S4 sounds.   Pulmonary:     Effort: No respiratory distress.     Breath sounds: Normal breath sounds. No decreased air movement. No decreased breath sounds, wheezing, rhonchi or rales.  Chest:     Chest wall: No mass.     Breasts: Breasts are symmetrical.        Right: Normal.        Left: Normal.  Abdominal:     General: Bowel sounds are normal.     Palpations: Abdomen is soft. There is no hepatomegaly, splenomegaly or mass.     Tenderness: There is no abdominal tenderness. There is no right CVA tenderness, left CVA tenderness, guarding or rebound.     Hernia: A hernia is present. Hernia is present in the umbilical area. There is no hernia in the left inguinal area or right inguinal area.  Genitourinary:    Penis: Normal.      Testes: Normal.        Right: Mass, tenderness or swelling not present.        Left: Mass, tenderness or swelling not present.     Epididymis:     Right: Normal.     Left: Normal.     Prostate: Normal. Not enlarged, not tender and no nodules present.     Rectum: Normal. Guaiac result negative. No mass.  Musculoskeletal:        General: No tenderness. Normal range of motion.     Left hand: Deformity present.     Cervical back: Full passive range of motion without pain, normal range of motion and neck supple.     Right lower leg: No edema.     Left lower leg: No edema.  Lymphadenopathy:     Head:     Right side of head:  No submental, submandibular or tonsillar adenopathy.     Left side of head: No submental, submandibular or tonsillar adenopathy.     Cervical: No cervical adenopathy.     Right cervical: No superficial, deep or posterior cervical adenopathy.    Left cervical: No superficial, deep or posterior cervical adenopathy.     Upper Body:     Right upper body: No supraclavicular, axillary or pectoral adenopathy.     Left upper body: No supraclavicular, axillary or pectoral adenopathy.     Lower Body: No right inguinal adenopathy. No left inguinal adenopathy.  Skin:    General: Skin is warm.     Capillary Refill: Capillary refill takes less than 2 seconds.     Findings: No rash.  Neurological:     Mental Status: He is alert and oriented to person, place, and time.     Cranial Nerves: Cranial nerves are intact. No cranial nerve deficit.     Sensory: Sensation is intact. No sensory deficit.     Motor: Motor function is intact.     Deep Tendon Reflexes: Reflexes are normal and symmetric.     Reflex Scores:      Tricep reflexes are 2+ on the right side and 2+ on the left side.      Bicep reflexes are 2+ on the right side and 2+ on the left side.      Brachioradialis reflexes are 2+ on the right side and 2+ on the left side.      Patellar reflexes are 2+ on the right side and 2+ on the left side.  Achilles reflexes are 2+ on the right side and 2+ on the left side. Psychiatric:        Attention and Perception: Attention normal.        Mood and Affect: Mood normal.        Speech: Speech normal.        Behavior: Behavior is cooperative.     Wt Readings from Last 3 Encounters:  05/17/19 196 lb (88.9 kg)  04/26/19 196 lb (88.9 kg)  02/08/19 200 lb (90.7 kg)    BP 120/70   Pulse 80   Ht 5\' 10"  (1.778 m)   Wt 196 lb (88.9 kg)   BMI 28.12 kg/m   Assessment and Plan:  1. Encounter for annual physical exam No subjective/objective concerns noted during the history and physical exam.   Patient's previous encounters, most recent lab, most recent imaging, and any care elsewhere in the recent past has been reviewed.Daniel Black is a 59 y.o. male who presents today for his Complete Annual Exam. He feels well. He reports exercising occasional. He reports he is sleeping well. Immunizations are reviewed and recommendations provided.   Age appropriate screening tests are discussed. Counseling given for risk factor reduction interventions.  At this time we will repeat a comprehensive metabolic panel. - Comprehensive Metabolic Panel (CMET)  2. Mixed hyperlipidemia Review of previous lipid panel notes this to be well controlled.  Chronic.  Stable.  Will continue rosuvastatin 40 mg once a day.  Will check lipid panel for further evaluation. - Lipid Panel With LDL/HDL Ratio  3. Nocturia Patient has occasional nocturia without other symptoms.  We will check a PSA at this time for evaluation.  DRE exam is unremarkable for size shape or consistency concerns.  There was no nodularity to it was noted. - PSA

## 2019-05-18 LAB — COMPREHENSIVE METABOLIC PANEL
ALT: 30 IU/L (ref 0–44)
AST: 24 IU/L (ref 0–40)
Albumin/Globulin Ratio: 2.5 — ABNORMAL HIGH (ref 1.2–2.2)
Albumin: 4.9 g/dL (ref 3.8–4.9)
Alkaline Phosphatase: 78 IU/L (ref 39–117)
BUN/Creatinine Ratio: 13 (ref 9–20)
BUN: 18 mg/dL (ref 6–24)
Bilirubin Total: 0.7 mg/dL (ref 0.0–1.2)
CO2: 25 mmol/L (ref 20–29)
Calcium: 10.1 mg/dL (ref 8.7–10.2)
Chloride: 102 mmol/L (ref 96–106)
Creatinine, Ser: 1.37 mg/dL — ABNORMAL HIGH (ref 0.76–1.27)
GFR calc Af Amer: 65 mL/min/{1.73_m2} (ref >59)
GFR calc non Af Amer: 56 mL/min/{1.73_m2} — ABNORMAL LOW (ref >59)
Globulin, Total: 2 g/dL (ref 1.5–4.5)
Glucose: 100 mg/dL — ABNORMAL HIGH (ref 65–99)
Potassium: 4.4 mmol/L (ref 3.5–5.2)
Sodium: 139 mmol/L (ref 134–144)
Total Protein: 6.9 g/dL (ref 6.0–8.5)

## 2019-05-18 LAB — LIPID PANEL WITH LDL/HDL RATIO
Cholesterol, Total: 159 mg/dL (ref 100–199)
HDL: 46 mg/dL (ref >39)
LDL Chol Calc (NIH): 99 mg/dL (ref 0–99)
LDL/HDL Ratio: 2.2 ratio (ref 0.0–3.6)
Triglycerides: 71 mg/dL (ref 0–149)
VLDL Cholesterol Cal: 14 mg/dL (ref 5–40)

## 2019-05-18 LAB — PSA: Prostate Specific Ag, Serum: 1.8 ng/mL (ref 0.0–4.0)

## 2019-05-19 ENCOUNTER — Other Ambulatory Visit
Admission: RE | Admit: 2019-05-19 | Discharge: 2019-05-19 | Disposition: A | Discharge status: Home / Self Care | Payer: No Typology Code available for payment source | Source: Ambulatory Visit | Attending: Infectious Diseases | Admitting: Infectious Diseases

## 2019-05-19 ENCOUNTER — Other Ambulatory Visit: Payer: Self-pay

## 2019-05-19 ENCOUNTER — Ambulatory Visit: Payer: No Typology Code available for payment source | Attending: Infectious Diseases | Admitting: Infectious Diseases

## 2019-05-19 ENCOUNTER — Encounter: Payer: Self-pay | Admitting: Infectious Diseases

## 2019-05-19 VITALS — BP 126/86 | HR 68 | Temp 97.9°F | Resp 16 | Ht 70.0 in | Wt 196.0 lb

## 2019-05-19 DIAGNOSIS — Z981 Arthrodesis status: Secondary | ICD-10-CM | POA: Insufficient documentation

## 2019-05-19 DIAGNOSIS — Z79899 Other long term (current) drug therapy: Secondary | ICD-10-CM | POA: Insufficient documentation

## 2019-05-19 DIAGNOSIS — Z822 Family history of deafness and hearing loss: Secondary | ICD-10-CM | POA: Insufficient documentation

## 2019-05-19 DIAGNOSIS — Z841 Family history of disorders of kidney and ureter: Secondary | ICD-10-CM | POA: Insufficient documentation

## 2019-05-19 DIAGNOSIS — Z955 Presence of coronary angioplasty implant and graft: Secondary | ICD-10-CM | POA: Insufficient documentation

## 2019-05-19 DIAGNOSIS — B20 Human immunodeficiency virus [HIV] disease: Secondary | ICD-10-CM | POA: Insufficient documentation

## 2019-05-19 DIAGNOSIS — R85612 Low grade squamous intraepithelial lesion on cytologic smear of anus (LGSIL): Secondary | ICD-10-CM | POA: Diagnosis not present

## 2019-05-19 DIAGNOSIS — I2510 Atherosclerotic heart disease of native coronary artery without angina pectoris: Secondary | ICD-10-CM | POA: Insufficient documentation

## 2019-05-19 DIAGNOSIS — Z7982 Long term (current) use of aspirin: Secondary | ICD-10-CM | POA: Insufficient documentation

## 2019-05-19 DIAGNOSIS — Z823 Family history of stroke: Secondary | ICD-10-CM | POA: Diagnosis not present

## 2019-05-19 DIAGNOSIS — I252 Old myocardial infarction: Secondary | ICD-10-CM | POA: Insufficient documentation

## 2019-05-19 DIAGNOSIS — Z8249 Family history of ischemic heart disease and other diseases of the circulatory system: Secondary | ICD-10-CM | POA: Diagnosis not present

## 2019-05-19 DIAGNOSIS — Z87891 Personal history of nicotine dependence: Secondary | ICD-10-CM | POA: Diagnosis not present

## 2019-05-19 DIAGNOSIS — N189 Chronic kidney disease, unspecified: Secondary | ICD-10-CM

## 2019-05-19 MED ORDER — BIKTARVY 50-200-25 MG PO TABS: 1.0000 | ORAL_TABLET | Freq: Every day | ORAL | 5 refills | Status: DC

## 2019-05-19 NOTE — Progress Notes (Signed)
NAME: Daniel Black  DOB: 28-May-1960  MRN: [id number]  Date/Time: 05/19/2019 9:14 AM   Subjective:   ?Follow-up visit for HIV. Daniel Black is a 59 y.o. male with a history of AIDS, CAD, STEMI status post stents is here for follow-up visit. He is on Biktarvy and 100% adherent.  No side effects.  Last viral load less than 50 from 11/11/2018  cd4 is 614 Since his last visit he saw his PCP, cardiologist  Got the 2 shots of mRNAvaccine  First visit to my clinic was 05/13/2018 He was followed by Dr. 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  A drug-eluting stent was successfully placed using a STENT RESOLUTE ONYX 3.5X12.  Ost RPDA to RPDA lesion is 95% stenosed. A drug-eluting stent was successfully placed using a STENT RESOLUTE ONYX 2.5X15. Had Vfib and was shocked? 8/15 Successful angioplasty and drug-eluting stent placement to the mid left circumflex. A second stent was overlapped proximally due to suspected edge dissection that was covered successfully with a second stent.    Low testosterone   Past Surgical History:   . CHOLECYSTECTOMY  . FINGER  Left reattachment Colonoscopy with poly removal . LUMBAR FUSION   SH Former smoker Works in the [employer] with male partner who is HIV negative  Has a cat 4 hens and 1 rooster   Family History  Problem Relation Age of Onset  . Heart disease Father   . Kidney disease Father   . Cancer Father   . Hearing loss Father   . Hypertension Father   . Stroke Father   . Vision loss Father   . Coronary artery disease Father   . Varicose Veins Father   .  Hypertension Mother   . Hearing loss Mother   . Heart disease Mother   . Coronary artery disease Other        family hx   No Known Allergies ? Current Outpatient Medications  Medication Sig Dispense Refill  . aspirin 81 MG chewable tablet Chew 1 tablet (81 mg total) by mouth daily.    . bictegravir-emtricitabine-tenofovir AF (BIKTARVY) 50-200-25 MG TABS tablet Take 1 tablet by mouth daily. 30 tablet 2  . ezetimibe (ZETIA) 10 MG tablet Take 1 tablet (10 mg total) by mouth daily. 90 tablet 3  . isosorbide mononitrate (IMDUR) 30 MG 24 hr tablet Take 1 tablet (30 mg total) by mouth daily. 90 tablet 3  . Multiple Vitamin (MULTIVITAMIN) tablet Take 1 tablet by mouth daily.    . rosuvastatin (CRESTOR) 40 MG tablet Take 1 tablet (40 mg total) by mouth daily. 90 tablet 3  . ticagrelor (BRILINTA) 60 MG TABS tablet Take 1 tablet (60 mg total) by mouth 2 (two) times daily. Dr Rockey Situ 180 tablet 3   No current facility-administered medications for this visit.    REVIEW OF SYSTEMS:  Const: negative fever, negative chills, negative weight loss Eyes: negative diplopia or visual changes, negative eye pain ENT: negative coryza, negative sore throat Resp: negative cough, hemoptysis, positive for dyspnea on walking fast Cards: negative for chest pain, positive for palpitations, lower extremity edema GU: negative for frequency, dysuria and hematuria Skin:  negative for rash and pruritus Heme: Easy bleeding MS: negative for myalgias, arthralgias, back pain and muscle weakness Neurolo:negative for headaches, dizziness, vertigo, memory problems  Psych: negative for feelings of anxiety, depression   Objective:  VITALS:  BP 126/86   Pulse 68   Temp 97.9 F (36.6 C)   Resp 16   Ht 5\' 10"  (1.778 m)   Wt 196 lb (88.9 kg)   SpO2 95%   BMI 28.12 kg/m  PHYSICAL EXAM:  General: Alert, cooperative, no distress, appears stated age.  Head: Normocephalic, without obvious abnormality, atraumatic. Eyes:  Conjunctivae clear, anicteric sclerae. Pupils are equal Nose: Nares normal. No drainage or sinus tenderness. Throat: Lips, mucosa, and tongue normal. No Thrush Neck: Supple, symmetrical, no adenopathy, thyroid: non tender no carotid bruit and no JVD. Back: No CVA tenderness. Lungs: Clear to auscultation bilaterally. No Wheezing or Rhonchi. No rales. Heart: Regular rate and rhythm, no murmur, rub or gallop. Abdomen: Soft, non-tender,not distended. Bowel sounds normal. No masses Extremities: Extremities normal, atraumatic, no cyanosis. No edema. No clubbing Skin: No rashes or lesions. Not Jaundiced Lymph: Cervical, supraclavicular normal. Neurologic: Grossly non-focal External examination of the anal area.  No warts seen no hemorrhoids. Anal Pap done  Pertinent Labs {Health maintenance Vaccination  Vaccine Date last given comment  Influenza    Hepatitis B    Hepatitis A    Prevnar-PCV-13    Pneumovac-PPSV-23    TdaP    HPV    Shingrix ( zoster vaccine)     ______________________  Labs Lab Result  Date comment  HIV VL 50  11/11/2018   CD4 614 11/11/18   Genotype  K103N  05/13/2018   [id number]  nonreactive  05/13/2018   HIV antibody     RPR  NR  05/13/2018   Quantiferon Gold  negative  05/13/2018   Hep C ab  nonreactive  05/13/2018   Hepatitis B-ab,ag,c Neg  05/13/2018  did not seroconvert after vaccination  Hepatitis A-IgM, IgG /T  IgM negative  05/13/2018   Lipid  91/40/38/66  05/13/2018   GC/CHL     PAP     HB,PLT,Cr, LFT       Preventive  Procedure Result  Date comment  colonoscopy   06/13/2016        Dental exam     Opthal       Impression/Recommendation ??HIV/AIDS- diagnosed in 1984 with nadir cd4 being 70. On biktarvy-100% adherent. Vl 50  and cd4 is 614   CAD - strong FH- has multiple stents- had STEMI in Aug 2019. - on tigacrelor, aspirin. The dose of tigacrelor is reduced to 60 mg ?  CKD- last cr from 11/11/18  was 1.47 -( says he took upto 5g/day of motrin in the past  which made him have CKD) Watch closely as on Tenofovir-  Genotype archive  Has only 103K mutation which is resistance to efavirenz - we can do a non tenofovir regimen- I am not inclined to use abacavir because of CAD. May do a dual therapy- will look into this further.   Health maintenance  updated. He will get his vaccination record No titres to hepb inspite of vaccination- ? heplisav    Anal PAP -LGSIL- will explore anoscopy  ?follow up 6 months or earlier ? ___________________________________________________ Discussed with patient

## 2019-05-19 NOTE — Patient Instructions (Signed)
You are here for follow up Vl 50 and cd4 is 614 Anal pal was low grade changes- will check to see whether you need anoscopy and where it can be done. Today will do routine labs Sent refills for [employer]

## 2019-05-20 LAB — T-HELPER CELLS CD4/CD8 %
% CD 4 Pos. Lymph.: 29.6 % — ABNORMAL LOW (ref 30.8–58.5)
Absolute CD 4 Helper: 799 /uL (ref 359–1519)
Basophils Absolute: 0 10*3/uL (ref 0.0–0.2)
Basos: 0 %
CD3+CD4+ Cells/CD3+CD8+ Cells Bld: 0.5 — ABNORMAL LOW (ref 0.92–3.72)
CD3+CD8+ Cells # Bld: 1593 /uL — ABNORMAL HIGH (ref 109–897)
CD3+CD8+ Cells NFr Bld: 59 % — ABNORMAL HIGH (ref 12.0–35.5)
EOS (ABSOLUTE): 0.1 10*3/uL (ref 0.0–0.4)
Eos: 2 %
Hematocrit: 47.1 % (ref 37.5–51.0)
Hemoglobin: 16.3 g/dL (ref 13.0–17.7)
Immature Grans (Abs): 0 10*3/uL (ref 0.0–0.1)
Immature Granulocytes: 0 %
Lymphocytes Absolute: 2.7 10*3/uL (ref 0.7–3.1)
Lymphs: 44 %
MCH: 32.7 pg (ref 26.6–33.0)
MCHC: 34.6 g/dL (ref 31.5–35.7)
MCV: 94 fL (ref 79–97)
Monocytes Absolute: 0.4 10*3/uL (ref 0.1–0.9)
Monocytes: 7 %
Neutrophils Absolute: 2.9 10*3/uL (ref 1.4–7.0)
Neutrophils: 47 %
Platelets: 221 10*3/uL (ref 150–450)
RBC: 4.99 x10E6/uL (ref 4.14–5.80)
RDW: 12.6 % (ref 11.6–15.4)
WBC: 6.1 10*3/uL (ref 3.4–10.8)

## 2019-05-20 LAB — RPR: RPR Ser Ql: NONREACTIVE

## 2019-05-20 LAB — HIV-1 RNA QUANT-NO REFLEX-BLD
HIV 1 RNA Quant: 30 copies/mL
LOG10 HIV-1 RNA: 1.477 log10copy/mL

## 2019-05-23 LAB — QUANTIFERON-TB GOLD PLUS (RQFGPL)
QuantiFERON Mitogen Value: >10 IU/mL
QuantiFERON Nil Value: 0.32 IU/mL
QuantiFERON TB1 Ag Value: 0.23 IU/mL
QuantiFERON TB2 Ag Value: 0.24 IU/mL

## 2019-05-23 LAB — QUANTIFERON-TB GOLD PLUS: QuantiFERON-TB Gold Plus: NEGATIVE

## 2019-06-01 MED FILL — BIKTARVY 50-200-25 MG TABS: 50-200-25 | 30 days supply | Qty: 30 | Fill #0

## 2019-07-04 MED FILL — BIKTARVY 50-200-25 MG TABS: 50-200-25 | 30 days supply | Qty: 30 | Fill #1

## 2019-08-04 MED FILL — BIKTARVY 50-200-25 MG TABS: 50-200-25 | 30 days supply | Qty: 30 | Fill #2

## 2019-08-10 ENCOUNTER — Other Ambulatory Visit: Payer: Self-pay

## 2019-08-10 ENCOUNTER — Emergency Department
Admission: EM | Admit: 2019-08-10 | Discharge: 2019-08-10 | Disposition: A | Discharge status: Home / Self Care | Payer: No Typology Code available for payment source | Attending: Emergency Medicine | Admitting: Emergency Medicine

## 2019-08-10 ENCOUNTER — Encounter: Payer: Self-pay | Admitting: Emergency Medicine

## 2019-08-10 ENCOUNTER — Emergency Department: Payer: No Typology Code available for payment source

## 2019-08-10 ENCOUNTER — Encounter: Payer: Self-pay | Admitting: Family Medicine

## 2019-08-10 DIAGNOSIS — Z9049 Acquired absence of other specified parts of digestive tract: Secondary | ICD-10-CM | POA: Diagnosis not present

## 2019-08-10 DIAGNOSIS — Z7982 Long term (current) use of aspirin: Secondary | ICD-10-CM | POA: Diagnosis not present

## 2019-08-10 DIAGNOSIS — I2510 Atherosclerotic heart disease of native coronary artery without angina pectoris: Secondary | ICD-10-CM | POA: Diagnosis not present

## 2019-08-10 DIAGNOSIS — R0789 Other chest pain: Secondary | ICD-10-CM

## 2019-08-10 DIAGNOSIS — I25119 Atherosclerotic heart disease of native coronary artery with unspecified angina pectoris: Secondary | ICD-10-CM | POA: Insufficient documentation

## 2019-08-10 DIAGNOSIS — I252 Old myocardial infarction: Secondary | ICD-10-CM | POA: Insufficient documentation

## 2019-08-10 DIAGNOSIS — Z87891 Personal history of nicotine dependence: Secondary | ICD-10-CM | POA: Diagnosis not present

## 2019-08-10 DIAGNOSIS — Z85828 Personal history of other malignant neoplasm of skin: Secondary | ICD-10-CM | POA: Insufficient documentation

## 2019-08-10 DIAGNOSIS — Z21 Asymptomatic human immunodeficiency virus [HIV] infection status: Secondary | ICD-10-CM | POA: Insufficient documentation

## 2019-08-10 DIAGNOSIS — R079 Chest pain, unspecified: Secondary | ICD-10-CM | POA: Diagnosis present

## 2019-08-10 LAB — BASIC METABOLIC PANEL
Anion gap: 9 (ref 5–15)
BUN: 24 mg/dL — ABNORMAL HIGH (ref 6–20)
CO2: 26 mmol/L (ref 22–32)
Calcium: 9.4 mg/dL (ref 8.9–10.3)
Chloride: 104 mmol/L (ref 98–111)
Creatinine, Ser: 1.75 mg/dL — ABNORMAL HIGH (ref 0.61–1.24)
GFR calc Af Amer: 49 mL/min — ABNORMAL LOW (ref >60)
GFR calc non Af Amer: 42 mL/min — ABNORMAL LOW (ref >60)
Glucose, Bld: 93 mg/dL (ref 70–99)
Potassium: 3.5 mmol/L (ref 3.5–5.1)
Sodium: 139 mmol/L (ref 135–145)

## 2019-08-10 LAB — TROPONIN I (HIGH SENSITIVITY)
Troponin I (High Sensitivity): 3 ng/L (ref <18)
Troponin I (High Sensitivity): 3 ng/L (ref <18)

## 2019-08-10 LAB — CBC
HCT: 44.2 % (ref 39.0–52.0)
Hemoglobin: 15.6 g/dL (ref 13.0–17.0)
MCH: 32.6 pg (ref 26.0–34.0)
MCHC: 35.3 g/dL (ref 30.0–36.0)
MCV: 92.5 fL (ref 80.0–100.0)
Platelets: 206 10*3/uL (ref 150–400)
RBC: 4.78 MIL/uL (ref 4.22–5.81)
RDW: 12.1 % (ref 11.5–15.5)
WBC: 7.2 10*3/uL (ref 4.0–10.5)
nRBC: 0 % (ref 0.0–0.2)

## 2019-08-10 NOTE — ED Triage Notes (Signed)
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 Pt states he has had multiple MIs, last was STEMI in 2019. Takes 81mg  ASA and Brilinta.

## 2019-08-10 NOTE — Discharge Instructions (Addendum)
Your kidney function was slightly elevated from your baseline at 1.75.  You should have this rechecked with your primary care doctor in 1 week and stay well-hydrated.  You should follow with the cardiology team next week.  If you develop chest pain again you should return to the ER to make sure is no signs of heart attack

## 2019-08-10 NOTE — ED Provider Notes (Signed)
[facility] Emergency Department Provider Note  ____________________________________________   First MD Initiated Contact with Patient 08/10/19 1800     (approximate)  I have reviewed the triage vital signs and the nursing notes.   HISTORY  Chief Complaint Chest Pain    HPI Daniel Black is a 59 y.o. male with coronary disease status post stents placed, hyperlipidemia who comes in with chest pain.  Patient had midsternal chest pain with nausea.  Patient takes aspirin and Brilinta.  Patient stated his symptoms started around 2:30 PM.  He was at work not exerting himself when he had some chest pressure in the center of his chest, nonradiating, nothing made it better, nothing made it worse.  It was associate with some nausea.  He states that his symptoms have since resolved after about 30 minutes.  He states that he has had some occasional chest discomfort and has not followed up with cardiology yet.  Has had stents placed and last was a year ago.  He denies any shortness of breath, leg swelling.  He currently feels at his baseline self.          Past Medical History:  Diagnosis Date  . Asymptomatic HIV infection ([place])   . Bell's palsy 2004  . CAD (coronary artery disease)    a. MI in 2004 w/ PCI to LAD and mRCA; b. LHC 07/2008: patent LAD stent, 30% distal LAD stenosis, 25% proximal LCx stenosis, 20% mid LCx stenosis, 95% mid RCA stenosis s/p PCI/DES (Xience 3.5 x 28 mm), and 40% distal RCA stenosis  . DDD (degenerative disc disease) 10/02   has lumbar fusion  . Erectile dysfunction   . HLD (hyperlipidemia)    mixed  . Myocardial infarction [facility]) 2004   x 3- last was July 2019 has 6 stents  . Nephrolithiasis    stage 3  . Skin cancer, basal cell    skin    Patient Active Problem List   Diagnosis Date Noted  . Acute ST elevation myocardial infarction (STEMI) of inferior wall ([place]) 11/16/2017  . CAD in native artery 11/16/2017  . AKI (acute  kidney injury) ([place]) 11/16/2017  . Special screening for malignant neoplasms, colon   . Benign neoplasm of ascending colon   . Human immunodeficiency virus (HIV) disease ([place]) 11/25/2009  . Hyperlipidemia 11/23/2009  . Coronary atherosclerosis 11/23/2009  . Chest pain 11/23/2009    Past Surgical History:  Procedure Laterality Date  . AMPUTATION  1985   L fingers reattachement   . back fusion     lumbar  . CARDIAC CATHETERIZATION     x2. 2004, 2010. 3 stents(total)  . CHOLECYSTECTOMY  3/03  . COLONOSCOPY WITH PROPOFOL N/A 06/13/2016   Procedure: COLONOSCOPY WITH PROPOFOL;  Surgeon: Lucilla Lame, MD;  Location: [place];  Service: Endoscopy;  Laterality: N/A;  . CORONARY/GRAFT ACUTE MI REVASCULARIZATION N/A 11/16/2017   Procedure: Coronary/Graft Acute MI Revascularization;  Surgeon: [name], MD;  Location: [place] CV LAB;  Service: Cardiovascular;  Laterality: N/A;  . LEFT HEART CATH AND CORONARY ANGIOGRAPHY N/A 11/16/2017   Procedure: LEFT HEART CATH AND CORONARY ANGIOGRAPHY;  Surgeon: [name], MD;  Location: [place] CV LAB;  Service: Cardiovascular;  Laterality: N/A;  . LEFT HEART CATH AND CORONARY ANGIOGRAPHY N/A 11/19/2017   Procedure: LEFT HEART CATH AND CORONARY ANGIOGRAPHY;  Surgeon: [name], MD;  Location: [place] CV LAB;  Service: Cardiovascular;  Laterality: N/A;  . POLYPECTOMY  06/13/2016   Procedure: POLYPECTOMY;  Surgeon: Lucilla Lame, MD;  Location: [place];  Service: Endoscopy;;  . WRIST SURGERY  10/2011    Prior to Admission medications   Medication Sig Start Date End Date Taking? Authorizing Provider  aspirin 81 MG chewable tablet Chew 1 tablet (81 mg total) by mouth daily. 11/20/17   Dustin Flock, MD  bictegravir-emtricitabine-tenofovir AF (BIKTARVY) 50-200-25 MG TABS tablet Take 1 tablet by mouth daily. 05/19/19   Tsosie Billing, MD  ezetimibe (ZETIA) 10 MG tablet Take 1 tablet (10 mg total) by  mouth daily. 10/05/18   Dunn, Areta Haber, PA-C  isosorbide mononitrate (IMDUR) 30 MG 24 hr tablet Take 1 tablet (30 mg total) by mouth daily. 04/12/19 07/11/19  Rise Mu, PA-C  Multiple Vitamin (MULTIVITAMIN) tablet Take 1 tablet by mouth daily.    [provider]  rosuvastatin (CRESTOR) 40 MG tablet Take 1 tablet (40 mg total) by mouth daily. 10/05/18   Rise Mu, PA-C  ticagrelor (BRILINTA) 60 MG TABS tablet Take 1 tablet (60 mg total) by mouth 2 (two) times daily. Dr Rockey Situ 12/09/18   [name], MD    Allergies Patient has no known allergies.  Family History  Problem Relation Age of Onset  . Heart disease Father   . Kidney disease Father   . Cancer Father   . Hearing loss Father   . Hypertension Father   . Stroke Father   . Vision loss Father   . Coronary artery disease Father   . Varicose Veins Father   . Hypertension Mother   . Hearing loss Mother   . Heart disease Mother   . Coronary artery disease Other        family hx    Social History Social History   Tobacco Use  . Smoking status: Former Smoker    Quit date: 04/07/2010    Years since quitting: 9.3  . Smokeless tobacco: Never Used  Substance Use Topics  . Alcohol use: No    Comment: occasional (1x/mo)  . Drug use: No      Review of Systems Constitutional: No fever/chills Eyes: No visual changes. ENT: No sore throat. Cardiovascular: Positive chest pain Respiratory: Denies shortness of breath. Gastrointestinal: No abdominal pain.  Positive nausea no vomiting.  No diarrhea.  No constipation. Genitourinary: Negative for dysuria. Musculoskeletal: Negative for back pain. Skin: Negative for rash. Neurological: Negative for headaches, focal weakness or numbness. All other ROS negative ____________________________________________   PHYSICAL EXAM:  VITAL SIGNS: ED Triage Vitals  Enc Vitals Group     BP 08/10/19 1407 112/73     Pulse Rate 08/10/19 1407 67     Resp 08/10/19 1407 18     Temp  08/10/19 1407 97.9 F (36.6 C)     Temp Source 08/10/19 1407 Oral     SpO2 08/10/19 1407 100 %     Weight 08/10/19 1408 182 lb (82.6 kg)     Height 08/10/19 1408 5\' 10"  (1.778 m)     Head Circumference --      Peak Flow --      Pain Score 08/10/19 1407 7     Pain Loc --      Pain Edu? --      Excl. in [place]? --     Constitutional: Alert and oriented. Well appearing and in no acute distress. Eyes: Conjunctivae are normal. EOMI. Head: Atraumatic. Nose: No congestion/rhinnorhea. Mouth/Throat: Mucous membranes are moist.   Neck: No stridor.  Trachea Midline. FROM Cardiovascular: Normal rate, regular rhythm. Grossly normal heart sounds.  Good peripheral circulation. Respiratory: Normal respiratory effort.  No retractions. Lungs CTAB. Gastrointestinal: Soft and nontender. No distention. No abdominal bruits.  Musculoskeletal: No lower extremity tenderness nor edema.  No joint effusions. Neurologic:  Normal speech and language. No gross focal neurologic deficits are appreciated.  Skin:  Skin is warm, dry and intact. No rash noted. Psychiatric: Mood and affect are normal. Speech and behavior are normal. GU: Deferred   ____________________________________________   LABS (all labs ordered are listed, but only abnormal results are displayed)  Labs Reviewed  BASIC METABOLIC PANEL - Abnormal; Notable for the following components:      Result Value   BUN 24 (*)    Creatinine, Ser 1.75 (*)    GFR calc non Af Amer 42 (*)    GFR calc Af Amer 49 (*)    All other components within normal limits  CBC  TROPONIN I (HIGH SENSITIVITY)  TROPONIN I (HIGH SENSITIVITY)   ____________________________________________   ED ECG REPORT I, Vanessa [place], the attending physician, personally viewed and interpreted this ECG.  EKG is normal sinus rate of 66, no ST elevation, no T wave inversions, normal intervals ____________________________________________  RADIOLOGY Robert Bellow, personally viewed  and evaluated these images (plain radiographs) as part of my medical decision making, as well as reviewing the written report by the radiologist.  ED MD interpretation: No pneumonia  Official radiology report(s): DG Chest 2 View  Result Date: 08/10/2019 CLINICAL DATA:  Mid chest pain for 1 week, nausea beginning today, history coronary artery disease post MI EXAM: CHEST - 2 VIEW COMPARISON:  None FINDINGS: Normal heart size and pulmonary vascularity. Tortuous aorta. Coronary arterial stents noted. Lungs clear. BILATERAL nipple shadows, confirmed on lateral view. No pulmonary infiltrate, pleural effusion or pneumothorax. Minor endplate spur formation thoracic spine. IMPRESSION: No acute abnormalities. Electronically Signed   By: Lavonia Dana M.D.   On: 08/10/2019 14:50    ____________________________________________   PROCEDURES  Procedure(s) performed (including Critical Care):  Procedures   ____________________________________________   INITIAL IMPRESSION / ASSESSMENT AND PLAN / ED COURSE   ALFREDA FAGIN was evaluated in Emergency Department on 08/10/2019 for the symptoms described in the history of present illness. He was evaluated in the context of the global COVID-19 pandemic, which necessitated consideration that the patient might be at risk for infection with the SARS-CoV-2 virus that causes COVID-19. Institutional protocols and algorithms that pertain to the evaluation of patients at risk for COVID-19 are in a state of rapid change based on information released by regulatory bodies including the CDC and federal and state organizations. These policies and algorithms were followed during the patient's care in the ED.    Most Likely DDx:  -ACS versus angina  DDx that was also considered d/t potential to cause harm, but was found less likely based on history and physical (as detailed above): -PNA (no fevers, cough but CXR to evaluate) -PNX (reassured with equal b/l breath sounds,  CXR to evaluate) -Symptomatic anemia (will get H&H) -Pulmonary embolism as no sob at rest, not pleuritic in nature, no hypoxia -Aortic Dissection as no tearing pain and no radiation to the mid back, pulses equal -Pericarditis no rub on exam, EKG changes or hx to suggest dx -Tamponade (no notable SOB, tachycardic, hypotensive) -Esophageal rupture (no h/o diffuse vomitting/no crepitus)   Patient's kidney function slightly elevated from baseline  Cardiac markers negative x2  No  signs of anemia  Discussed with patient he is already made a follow-up appointment with his cardiologist Dr. Rockey Situ for next week so he would like to build go home..  We discussed that just because he had no signs of heart attack today that he could have restenosis of some of the stents that could require interventions and that if he develops the chest discomfort again he should return the ER immediately for reevaluation given his high risk nature  I discussed the provisional nature of ED diagnosis, the treatment so far, the ongoing plan of care, follow up appointments and return precautions with the patient and any family or support people present. They expressed understanding and agreed with the plan, discharged home.            ____________________________________________   FINAL CLINICAL IMPRESSION(S) / ED DIAGNOSES   Final diagnoses:  Other chest pain     MEDICATIONS GIVEN DURING THIS VISIT:  Medications - No data to display   ED Discharge Orders    None       Note:  This document was prepared using Dragon voice recognition software and may include unintentional dictation errors.   Vanessa [employer], MD 08/10/19 [phone]

## 2019-08-16 ENCOUNTER — Other Ambulatory Visit: Payer: Self-pay

## 2019-08-16 ENCOUNTER — Encounter: Payer: Self-pay | Admitting: Family

## 2019-08-16 ENCOUNTER — Ambulatory Visit (INDEPENDENT_AMBULATORY_CARE_PROVIDER_SITE_OTHER): Payer: No Typology Code available for payment source | Admitting: Family

## 2019-08-16 VITALS — BP 116/82 | HR 68 | Ht 70.0 in | Wt 188.1 lb

## 2019-08-16 DIAGNOSIS — I25118 Atherosclerotic heart disease of native coronary artery with other forms of angina pectoris: Secondary | ICD-10-CM

## 2019-08-16 DIAGNOSIS — K30 Functional dyspepsia: Secondary | ICD-10-CM

## 2019-08-16 DIAGNOSIS — E782 Mixed hyperlipidemia: Secondary | ICD-10-CM

## 2019-08-16 MED ORDER — NITROGLYCERIN 0.4 MG SL SUBL: 0.4000 mg | SUBLINGUAL_TABLET | SUBLINGUAL | 11 refills | Status: DC | PRN

## 2019-08-16 NOTE — Patient Instructions (Addendum)
Medication Instructions:  Your physician has recommended you make the following change in your medication:   START Protonix daily - if you need a prescription let us know  If you still have residual symptoms after one week we will consider INCREASING Imdur to 60mg  daily.  *If you need a refill on your cardiac medications before your next appointment, please call your pharmacy*  Lab Work: No lab work today.   Testing/Procedures: Your EKG today shows normal sinus rhythm with old MI. It was stable compared to previous.   Follow-Up: At [facility], you and your health needs are our priority.  As part of our continuing mission to provide you with exceptional heart care, we have created designated Provider Care Teams.  These Care Teams include your primary Cardiologist (physician) and Advanced Practice Providers (APPs -  Physician Assistants and Nurse Practitioners) who all work together to provide you with the care you need, when you need it.  We recommend signing up for the patient portal called "MyChart".  Sign up information is provided on this After Visit Summary.  MyChart is used to connect with patients for Virtual Visits (Telemedicine).  Patients are able to view lab/test results, encounter notes, upcoming appointments, etc.  Non-urgent messages can be sent to your provider as well.   To learn more about what you can do with MyChart, go to [email or web address].    Your next appointment:   In January as previously recommended.  The format for your next appointment:   In Person  Provider:   You may see Ida Rogue, MD or one of the following Advanced Practice Providers on your designated Care Team:    Murray Hodgkins, NP  Christell Faith, PA-C  Marrianne Mood, PA-C  Laurann Montana, NP

## 2019-08-16 NOTE — Progress Notes (Signed)
Office Visit    Patient Name: Daniel Black Date of Encounter: 08/16/2019  Primary Care Provider:  Juline Patch, MD Primary Cardiologist:  Ida Rogue, MD Electrophysiologist:  None   Chief Complaint    Daniel Black is a 59 y.o. male with a hx of CAD s/p MI 2004 with PCI to LAD/midRCA and PCI 2010 of the mid RCA lesion presents today for follow up of CAD.   Past Medical History    Past Medical History:  Diagnosis Date  . Asymptomatic HIV infection ([place])   . Bell's palsy 2004  . CAD (coronary artery disease)    a. MI in 2004 w/ PCI to LAD and mRCA; b. LHC 07/2008: patent LAD stent, 30% distal LAD stenosis, 25% proximal LCx stenosis, 20% mid LCx stenosis, 95% mid RCA stenosis s/p PCI/DES (Xience 3.5 x 28 mm), and 40% distal RCA stenosis  . DDD (degenerative disc disease) 10/02   has lumbar fusion  . Erectile dysfunction   . HLD (hyperlipidemia)    mixed  . Myocardial infarction [facility]) 2004   x 3- last was July 2019 has 6 stents  . Nephrolithiasis    stage 3  . Skin cancer, basal cell    skin   Past Surgical History:  Procedure Laterality Date  . AMPUTATION  1985   L fingers reattachement   . back fusion     lumbar  . CARDIAC CATHETERIZATION     x2. 2004, 2010. 3 stents(total)  . CHOLECYSTECTOMY  3/03  . COLONOSCOPY WITH PROPOFOL N/A 06/13/2016   Procedure: COLONOSCOPY WITH PROPOFOL;  Surgeon: Lucilla Lame, MD;  Location: [place];  Service: Endoscopy;  Laterality: N/A;  . CORONARY/GRAFT ACUTE MI REVASCULARIZATION N/A 11/16/2017   Procedure: Coronary/Graft Acute MI Revascularization;  Surgeon: [name], MD;  Location: [place] CV LAB;  Service: Cardiovascular;  Laterality: N/A;  . LEFT HEART CATH AND CORONARY ANGIOGRAPHY N/A 11/16/2017   Procedure: LEFT HEART CATH AND CORONARY ANGIOGRAPHY;  Surgeon: [name], MD;  Location: [place] CV LAB;  Service: Cardiovascular;  Laterality: N/A;  . LEFT HEART CATH AND CORONARY  ANGIOGRAPHY N/A 11/19/2017   Procedure: LEFT HEART CATH AND CORONARY ANGIOGRAPHY;  Surgeon: [name], MD;  Location: [place] CV LAB;  Service: Cardiovascular;  Laterality: N/A;  . POLYPECTOMY  06/13/2016   Procedure: POLYPECTOMY;  Surgeon: Lucilla Lame, MD;  Location: [place];  Service: Endoscopy;;  . WRIST SURGERY  10/2011    Allergies  No Known Allergies  History of Present Illness    Daniel Black is a 59 y.o. male with a hx of CAD s/p MI 2004 with PCI to LAD/midRCA and PCI 2010 of the mid RCA lesion, HIV, HLD, remote tobacco use last seen 04/26/19 by Dr. Rockey Situ.  Seen in the ED 08/10/19 for chest pain. Pain was midsternal associated with nausea which lasted 30 minutes. HS-TroponinI 3 and 3. Kidney function mildly elevated above baseline - creatinine 1.75, GFR 42. Hemoglobin 15.6. CXR no acute findings. EKG NSR 66 bpm with prior inferior infarct.   Reports symptoms of chest discomfort have been ongoing for a few weeks.  Feels like a tightness across his chest.  Does not feel similar to previous anginal symptoms.  Occurs primarily at rest.  Tells me the symptoms wax and wane.  No noted exacerbating factors.  He primarily went to the ED the other day as he had nausea associated with the symptoms for the first  time and became somewhat worried.  Checks BP at home and it is routinely in the 110s/80s.  He reports no lightheadedness, dizziness, near-syncope, syncope.  Reports some dyspnea on exertion with more than usual activity such as hiking.  This is stable at his baseline.  Reports lots of indigestion recently.  Endorses he has not been eating as well as he used to.  Has not taken anything for his indigestion.  EKGs/Labs/Other Studies Reviewed:   The following studies were reviewed today:  LHC 11/2017  Prox LAD to Mid LAD lesion is 10% stenosed.  Mid LAD lesion is 30% stenosed.  Prox Cx to Mid Cx lesion is 95% stenosed.  Prox RCA to Mid RCA lesion is 20%  stenosed.  Mid RCA to Dist RCA lesion is 30% stenosed.  Mid RCA lesion is 30% stenosed.  Previously placed Dist RCA drug eluting stent is widely patent.  Balloon angioplasty was performed.  Ost RPDA to RPDA lesion is 95% stenosed.  Previously placed Post Atrio-1 drug eluting stent is widely patent.  Balloon angioplasty was performed.  Post Atrio-2 lesion is 100% stenosed.  Post intervention, there is a 0% residual stenosis.  A drug-eluting stent was successfully placed using a [place] [id number].   Successful angioplasty and drug-eluting stent placement to the mid left circumflex.  A second stent was overlapped proximally due to suspected edge dissection that was covered successfully with a second stent.   Recommendations:   Recommend dual antiplatelet therapy with Aspirin 81mg  daily and Clopidogrel 75mg  daily long-term (beyond 12 months) because of Multiple stents..    Likely discharge home tomorrow if no complications.  Echo 11/2017 - Left ventricle: The cavity size was normal. There was mild    concentric hypertrophy. Systolic function was normal. The    estimated ejection fraction was in the range of 55% to 60%. Mild    hypokinesis of the inferior myocardium. Left ventricular    diastolic function parameters were normal.   EKG:  EKG is ordered today.  The ekg ordered today demonstrates NSR 68 bpm with prior inferior changes consistent with prior EKG  Recent Labs: 05/17/2019: ALT 30 08/10/2019: BUN 24; Creatinine, Ser 1.75; Hemoglobin 15.6; Platelets 206; Potassium 3.5; Sodium 139  Recent Lipid Panel    Component Value Date/Time   CHOL 159 05/17/2019 0935   CHOL 167 04/25/2014 0633   TRIG 71 05/17/2019 0935   TRIG 106 04/25/2014 0633   HDL 46 05/17/2019 0935   HDL 53 04/25/2014 0633   CHOLHDL 2.3 05/12/2018 1028   CHOLHDL 3.2 11/17/2017 0132   VLDL 11 11/17/2017 0132   VLDL 21 04/25/2014 0633   LDLCALC 99 05/17/2019 0935   LDLCALC 93 04/25/2014 0633     Home Medications   Current Meds  Medication Sig  . aspirin 81 MG chewable tablet Chew 1 tablet (81 mg total) by mouth daily.  . bictegravir-emtricitabine-tenofovir AF (BIKTARVY) 50-200-25 MG TABS tablet Take 1 tablet by mouth daily.  Marland Kitchen ezetimibe (ZETIA) 10 MG tablet Take 1 tablet (10 mg total) by mouth daily.  . isosorbide mononitrate (IMDUR) 30 MG 24 hr tablet Take 1 tablet (30 mg total) by mouth daily.  . Multiple Vitamin (MULTIVITAMIN) tablet Take 1 tablet by mouth daily.  . rosuvastatin (CRESTOR) 40 MG tablet Take 1 tablet (40 mg total) by mouth daily.  . ticagrelor (BRILINTA) 60 MG TABS tablet Take 1 tablet (60 mg total) by mouth 2 (two) times daily. Dr Rockey Situ   Review of Systems  Review of Systems  Constitution: Negative for chills, fever and malaise/fatigue.  Cardiovascular: Positive for chest pain and dyspnea on exertion (at baseline). Negative for leg swelling, near-syncope, orthopnea, palpitations and syncope.  Respiratory: Negative for cough, shortness of breath and wheezing.   Gastrointestinal: Positive for heartburn. Negative for nausea and vomiting.  Neurological: Negative for dizziness, light-headedness and weakness.   All other systems reviewed and are otherwise negative except as noted above.  Physical Exam    VS:  BP 116/82 (BP Location: Left Arm, Patient Position: Sitting, Cuff Size: Normal)   Pulse 68   Ht 5\' 10"  (1.778 m)   Wt 188 lb 2 oz (85.3 kg)   SpO2 98%   BMI 26.99 kg/m  , BMI Body mass index is 26.99 kg/m. GEN: Well nourished, well developed, in no acute distress. HEENT: normal. Neck: Supple, no JVD, carotid bruits, or masses. Cardiac: RRR, no murmurs, rubs, or gallops. No clubbing, cyanosis, edema.  Radials/PT 2+ and equal bilaterally.  Respiratory:  Respirations regular and unlabored, clear to auscultation bilaterally. GI: Soft, nontender, nondistended. MS: No deformity or atrophy. Skin: Warm and dry, no rash. Neuro:  Strength and  sensation are intact. Psych: Normal affect.  Accessory Clinical Findings    ECG personally reviewed by me today -normal sinus rhythm 68 bpm with stable ST/T wave abnormality in inferior leads- no acute changes.  Assessment & Plan    1. CAD -s/p stent to distal RCA and mid left circumflex. Recent ED visit with episode of chest discomfort across the chest associated with nausea that occurred at rest. EKG today with stable ST/T wave abnormality in inferior leads. Symptoms did not feel like his prior anginal symptoms and are atypical for angina as they occurred at rest. PRN nitroglycerin Rx provided. No indication for ischemia evaluation. Anticipate his symptoms are related to indigestion which he endorses more of. Start Pantoprazole daily. He will monitor BP at home and if symptoms do not improve on PPI, consider increased dose Imdur. Follow up on Monday via MyChart message.   2. HLD, LDL goal less than 70 - Lipid panel 05/17/19 with LDL 99. Endorses not following lipid-lowering diet. Education provided on lipid-lowering diet. Recommend recheck in August.   3. Indigestion - Possible etiology of chest discomfort. Recommend avoiding fried food, spicy food, acidic foods. Start Pantoprazole daily.   4. HIV -follows with ID.  Disposition: Follow up in January 2022 with Dr. Arta Bruce or APP.  Loel Dubonnet, NP 08/16/2019, 3:46 PM

## 2019-08-22 ENCOUNTER — Encounter: Payer: Self-pay | Admitting: Family

## 2019-08-22 ENCOUNTER — Other Ambulatory Visit: Payer: Self-pay | Admitting: Family

## 2019-08-22 DIAGNOSIS — K30 Functional dyspepsia: Secondary | ICD-10-CM

## 2019-08-22 DIAGNOSIS — K219 Gastro-esophageal reflux disease without esophagitis: Secondary | ICD-10-CM

## 2019-08-22 MED ORDER — PANTOPRAZOLE SODIUM 40 MG PO TBEC: 40.0000 mg | DELAYED_RELEASE_TABLET | Freq: Every day | ORAL | 1 refills | Status: DC

## 2019-08-30 ENCOUNTER — Encounter: Payer: Self-pay | Admitting: Family Medicine

## 2019-09-01 MED FILL — BIKTARVY 50-200-25 MG TABS: 50-200-25 | 30 days supply | Qty: 30 | Fill #3

## 2019-09-21 ENCOUNTER — Encounter: Payer: Self-pay | Admitting: Family Medicine

## 2019-09-21 ENCOUNTER — Other Ambulatory Visit: Payer: Self-pay

## 2019-09-21 ENCOUNTER — Ambulatory Visit (INDEPENDENT_AMBULATORY_CARE_PROVIDER_SITE_OTHER): Payer: No Typology Code available for payment source | Admitting: Family Medicine

## 2019-09-21 VITALS — BP 138/80 | HR 88 | Temp 98.1°F | Ht 70.0 in | Wt 190.0 lb

## 2019-09-21 DIAGNOSIS — J0100 Acute maxillary sinusitis, unspecified: Secondary | ICD-10-CM

## 2019-09-21 MED ORDER — AMOXICILLIN-POT CLAVULANATE 875-125 MG PO TABS: 1.0000 | ORAL_TABLET | Freq: Two times a day (BID) | ORAL | 0 refills | Status: DC

## 2019-09-21 NOTE — Progress Notes (Addendum)
Date:  09/21/2019   Name:  Daniel Black   DOB:  Jan 20, 1961   MRN:  [id number]   Chief Complaint: Sinusitis (dark, grey production. coughing from tickle in throat)  Sinusitis This is a chronic problem. The current episode started more than 1 year ago. The problem has been gradually worsening since onset. There has been no fever. The pain is mild. Associated symptoms include congestion, headaches and sinus pressure. Pertinent negatives include no chills, coughing, diaphoresis, ear pain, neck pain, shortness of breath or sore throat. Past treatments include oral decongestants (mucinex). The treatment provided mild relief.    Lab Results  Component Value Date   CREATININE 1.75 (H) 08/10/2019   BUN 24 (H) 08/10/2019   NA 139 08/10/2019   K 3.5 08/10/2019   CL 104 08/10/2019   CO2 26 08/10/2019   Lab Results  Component Value Date   CHOL 159 05/17/2019   HDL 46 05/17/2019   LDLCALC 99 05/17/2019   TRIG 71 05/17/2019   CHOLHDL 2.3 05/12/2018   Lab Results  Component Value Date   TSH 4.546 (H) 11/20/2017   Lab Results  Component Value Date   HGBA1C 5.3 11/16/2017   Lab Results  Component Value Date   WBC 7.2 08/10/2019   HGB 15.6 08/10/2019   HCT 44.2 08/10/2019   MCV 92.5 08/10/2019   PLT 206 08/10/2019   Lab Results  Component Value Date   ALT 30 05/17/2019   AST 24 05/17/2019   ALKPHOS 78 05/17/2019   BILITOT 0.7 05/17/2019     Review of Systems  Constitutional: Negative for chills, diaphoresis and fever.  HENT: Positive for congestion and sinus pressure. Negative for drooling, ear discharge, ear pain and sore throat.   Respiratory: Negative for cough, shortness of breath and wheezing.   Cardiovascular: Negative for chest pain, palpitations and leg swelling.  Gastrointestinal: Negative for abdominal pain, blood in stool, constipation, diarrhea and nausea.  Endocrine: Negative for polydipsia.  Genitourinary: Negative for dysuria, frequency, hematuria and  urgency.  Musculoskeletal: Negative for back pain, myalgias and neck pain.  Skin: Negative for rash.  Allergic/Immunologic: Negative for environmental allergies.  Neurological: Positive for headaches. Negative for dizziness.  Hematological: Does not bruise/bleed easily.  Psychiatric/Behavioral: Negative for suicidal ideas. The patient is not nervous/anxious.     Patient Active Problem List   Diagnosis Date Noted  . Acute ST elevation myocardial infarction (STEMI) of inferior wall ([place]) 11/16/2017  . CAD in native artery 11/16/2017  . AKI (acute kidney injury) ([place]) 11/16/2017  . Special screening for malignant neoplasms, colon   . Benign neoplasm of ascending colon   . Human immunodeficiency virus (HIV) disease ([place]) 11/25/2009  . Hyperlipidemia 11/23/2009  . Coronary atherosclerosis 11/23/2009  . Chest pain 11/23/2009    No Known Allergies  Past Surgical History:  Procedure Laterality Date  . AMPUTATION  1985   L fingers reattachement   . back fusion     lumbar  . CARDIAC CATHETERIZATION     x2. 2004, 2010. 3 stents(total)  . CHOLECYSTECTOMY  3/03  . COLONOSCOPY WITH PROPOFOL N/A 06/13/2016   Procedure: COLONOSCOPY WITH PROPOFOL;  Surgeon: Lucilla Lame, MD;  Location: [facility];  Service: Endoscopy;  Laterality: N/A;  . CORONARY/GRAFT ACUTE MI REVASCULARIZATION N/A 11/16/2017   Procedure: Coronary/Graft Acute MI Revascularization;  Surgeon: [name], MD;  Location: [facility];  Service: Cardiovascular;  Laterality: N/A;  . LEFT HEART CATH AND CORONARY ANGIOGRAPHY N/A 11/16/2017  Procedure: LEFT HEART CATH AND CORONARY ANGIOGRAPHY;  Surgeon: [name], MD;  Location: [facility];  Service: Cardiovascular;  Laterality: N/A;  . LEFT HEART CATH AND CORONARY ANGIOGRAPHY N/A 11/19/2017   Procedure: LEFT HEART CATH AND CORONARY ANGIOGRAPHY;  Surgeon: [name], MD;  Location: [facility];  Service: Cardiovascular;   Laterality: N/A;  . POLYPECTOMY  06/13/2016   Procedure: POLYPECTOMY;  Surgeon: Lucilla Lame, MD;  Location: [facility];  Service: Endoscopy;;  . WRIST SURGERY  10/2011    Social History   Tobacco Use  . Smoking status: Former Smoker    Quit date: 04/07/2010    Years since quitting: 9.4  . Smokeless tobacco: Never Used  Substance Use Topics  . Alcohol use: No    Comment: occasional (1x/mo)  . Drug use: No     Medication list has been reviewed and updated.  Current Meds  Medication Sig  . aspirin 81 MG chewable tablet Chew 1 tablet (81 mg total) by mouth daily.  . bictegravir-emtricitabine-tenofovir AF (BIKTARVY) 50-200-25 MG TABS tablet Take 1 tablet by mouth daily.  Marland Kitchen ezetimibe (ZETIA) 10 MG tablet Take 1 tablet (10 mg total) by mouth daily.  . isosorbide mononitrate (IMDUR) 30 MG 24 hr tablet Take 1 tablet (30 mg total) by mouth daily.  . Multiple Vitamin (MULTIVITAMIN) tablet Take 1 tablet by mouth daily.  . nitroGLYCERIN (NITROSTAT) 0.4 MG SL tablet Place 1 tablet (0.4 mg total) under the tongue every 5 (five) minutes as needed for chest pain.  . pantoprazole (PROTONIX) 40 MG tablet Take 1 tablet (40 mg total) by mouth daily.  . rosuvastatin (CRESTOR) 40 MG tablet Take 1 tablet (40 mg total) by mouth daily.  . ticagrelor (BRILINTA) 60 MG TABS tablet Take 1 tablet (60 mg total) by mouth 2 (two) times daily. Dr Rockey Situ    [facility] 2/9 Scores 09/21/2019 05/17/2019 02/08/2019 05/12/2018  PHQ - 2 Score 0 0 0 0  PHQ- 9 Score 0 0 0 0    GAD 7 : Generalized Anxiety Score 09/21/2019 05/17/2019  Nervous, Anxious, on Edge 0 0  Control/stop worrying 0 0  Worry too much - different things 0 0  Trouble relaxing 0 0  Restless 0 0  Easily annoyed or irritable 0 0  Afraid - awful might happen 0 0  Total GAD 7 Score 0 0    BP Readings from Last 3 Encounters:  09/21/19 138/80  08/16/19 116/82  08/10/19 (!) 142/88    Physical Exam Vitals and nursing note reviewed.  HENT:     Head:  Normocephalic.     Jaw: There is normal jaw occlusion.     Right Ear: Tympanic membrane, ear canal and external ear normal.     Left Ear: Tympanic membrane, ear canal and external ear normal.     Nose:     Right Turbinates: Not enlarged.     Left Turbinates: Not enlarged.     Right Sinus: No maxillary sinus tenderness or frontal sinus tenderness.     Left Sinus: Maxillary sinus tenderness present. No frontal sinus tenderness.  Eyes:     General: No scleral icterus.       Right eye: No discharge.        Left eye: No discharge.     Conjunctiva/sclera: Conjunctivae normal.     Pupils: Pupils are equal, round, and reactive to light.  Neck:     Thyroid: No thyromegaly.     Vascular:  No JVD.     Trachea: No tracheal deviation.  Cardiovascular:     Rate and Rhythm: Normal rate and regular rhythm.     Heart sounds: Normal heart sounds, S1 normal and S2 normal. No murmur heard.  No systolic murmur is present.  No diastolic murmur is present.  No friction rub. No gallop. No S3 or S4 sounds.   Pulmonary:     Effort: No respiratory distress.     Breath sounds: Normal breath sounds. No wheezing or rales.  Abdominal:     General: Bowel sounds are normal.     Palpations: Abdomen is soft. There is no mass.     Tenderness: There is no abdominal tenderness. There is no guarding or rebound.  Musculoskeletal:        General: No tenderness. Normal range of motion.     Cervical back: Normal range of motion and neck supple.  Lymphadenopathy:     Cervical: No cervical adenopathy.  Skin:    General: Skin is warm.     Findings: No rash.  Neurological:     Mental Status: He is alert and oriented to person, place, and time.     Cranial Nerves: No cranial nerve deficit.     Deep Tendon Reflexes: Reflexes are normal and symmetric.     Wt Readings from Last 3 Encounters:  09/21/19 190 lb (86.2 kg)  08/16/19 188 lb 2 oz (85.3 kg)  08/10/19 182 lb (82.6 kg)    BP 138/80   Pulse 88   Temp 98.1  F (36.7 C) (Oral)   Ht 5\' 10"  (1.778 m)   Wt 190 lb (86.2 kg)   BMI 27.26 kg/m   Assessment and Plan:  1. Acute non-recurrent maxillary sinusitis Acute.  Persistent.  Unresolved over 1 week.  Patient has productive nasal discharge with exam consistent with maxillary and frontal sinusitis.  Will initiate Augmentin 875 mg twice a day. - amoxicillin-clavulanate (AUGMENTIN) 875-125 MG tablet; Take 1 tablet by mouth 2 (two) times daily.  Dispense: 20 tablet; Refill: 0

## 2019-09-29 MED FILL — BIKTARVY 50-200-25 MG TABS: 50-200-25 | 30 days supply | Qty: 30 | Fill #4

## 2019-10-11 ENCOUNTER — Other Ambulatory Visit: Payer: Self-pay | Admitting: Physician Assistant

## 2019-10-12 ENCOUNTER — Other Ambulatory Visit: Payer: Self-pay | Admitting: Physician Assistant

## 2019-10-27 MED FILL — BIKTARVY 50-200-25 MG TABS: 50-200-25 | 30 days supply | Qty: 30 | Fill #5

## 2019-11-22 ENCOUNTER — Ambulatory Visit: Payer: No Typology Code available for payment source | Admitting: Infectious Diseases

## 2019-11-25 ENCOUNTER — Encounter: Payer: Self-pay | Admitting: Family Medicine

## 2019-12-09 ENCOUNTER — Other Ambulatory Visit (HOSPITAL_COMMUNITY): Payer: Self-pay | Admitting: Infectious Diseases

## 2019-12-09 MED FILL — BIKTARVY 50-200-25 MG TABS: 50-200-25 | 30 days supply | Qty: 30 | Fill #0

## 2020-01-02 MED FILL — BIKTARVY 50-200-25 MG TABS: 50-200-25 | 30 days supply | Qty: 30 | Fill #1

## 2020-01-05 ENCOUNTER — Telehealth: Payer: Self-pay

## 2020-01-05 NOTE — Telephone Encounter (Signed)
Called to get in for 6 mo follow up and he now sees [place] for all care.

## 2020-01-30 ENCOUNTER — Other Ambulatory Visit: Payer: Self-pay | Admitting: Cardiovascular Disease

## 2020-02-02 MED FILL — BIKTARVY 50-200-25 MG TABS: 50-200-25 | 30 days supply | Qty: 30 | Fill #2

## 2020-02-06 ENCOUNTER — Other Ambulatory Visit: Payer: Self-pay | Admitting: Pharmacist

## 2020-02-06 DIAGNOSIS — B20 Human immunodeficiency virus [HIV] disease: Secondary | ICD-10-CM

## 2020-02-06 MED ORDER — BIKTARVY 50-200-25 MG PO TABS: 1.0000 | ORAL_TABLET | Freq: Every day | ORAL | 2 refills | Status: DC

## 2020-02-28 MED FILL — BIKTARVY 50-200-25 MG TABS: 50-200-25 | 30 days supply | Qty: 30 | Fill #0

## 2020-04-02 MED FILL — BIKTARVY 50-200-25 MG TABS: 50-200-25 | 30 days supply | Qty: 30 | Fill #1

## 2020-05-03 MED FILL — BIKTARVY 50-200-25 MG TABS: 50-200-25 | 30 days supply | Qty: 30 | Fill #2

## 2020-05-08 ENCOUNTER — Other Ambulatory Visit: Payer: Self-pay | Admitting: Family

## 2020-05-08 ENCOUNTER — Other Ambulatory Visit: Payer: Self-pay | Admitting: Physician Assistant

## 2020-05-10 DIAGNOSIS — B079 Viral wart, unspecified: Secondary | ICD-10-CM | POA: Diagnosis not present

## 2020-05-10 DIAGNOSIS — R85619 Unspecified abnormal cytological findings in specimens from anus: Secondary | ICD-10-CM | POA: Diagnosis not present

## 2020-05-10 DIAGNOSIS — K6282 Dysplasia of anus: Secondary | ICD-10-CM | POA: Diagnosis not present

## 2020-05-10 DIAGNOSIS — Z7982 Long term (current) use of aspirin: Secondary | ICD-10-CM | POA: Diagnosis not present

## 2020-05-10 DIAGNOSIS — Z7902 Long term (current) use of antithrombotics/antiplatelets: Secondary | ICD-10-CM | POA: Diagnosis not present

## 2020-05-18 ENCOUNTER — Encounter: Payer: No Typology Code available for payment source | Admitting: Family Medicine

## 2020-05-28 ENCOUNTER — Other Ambulatory Visit: Payer: Self-pay | Admitting: Pharmacist

## 2020-05-28 DIAGNOSIS — B20 Human immunodeficiency virus [HIV] disease: Secondary | ICD-10-CM

## 2020-06-01 ENCOUNTER — Other Ambulatory Visit: Payer: Self-pay | Admitting: Internal Medicine

## 2020-06-01 ENCOUNTER — Encounter: Payer: Self-pay | Admitting: Pharmacist

## 2020-06-01 ENCOUNTER — Telehealth: Payer: Self-pay | Admitting: Pharmacist

## 2020-06-01 ENCOUNTER — Ambulatory Visit (HOSPITAL_BASED_OUTPATIENT_CLINIC_OR_DEPARTMENT_OTHER): Payer: 59 | Admitting: Pharmacist

## 2020-06-01 ENCOUNTER — Other Ambulatory Visit: Payer: Self-pay

## 2020-06-01 DIAGNOSIS — Z21 Asymptomatic human immunodeficiency virus [HIV] infection status: Secondary | ICD-10-CM | POA: Diagnosis not present

## 2020-06-01 DIAGNOSIS — I2510 Atherosclerotic heart disease of native coronary artery without angina pectoris: Secondary | ICD-10-CM | POA: Diagnosis not present

## 2020-06-01 DIAGNOSIS — R85619 Unspecified abnormal cytological findings in specimens from anus: Secondary | ICD-10-CM | POA: Diagnosis not present

## 2020-06-01 DIAGNOSIS — B20 Human immunodeficiency virus [HIV] disease: Secondary | ICD-10-CM

## 2020-06-01 DIAGNOSIS — E782 Mixed hyperlipidemia: Secondary | ICD-10-CM | POA: Diagnosis not present

## 2020-06-01 DIAGNOSIS — Z79899 Other long term (current) drug therapy: Secondary | ICD-10-CM

## 2020-06-01 MED ORDER — BIKTARVY 50-200-25 MG PO TABS: 1.0000 | ORAL_TABLET | Freq: Every day | ORAL | 2 refills | Status: DC

## 2020-06-01 MED FILL — BIKTARVY 50-200-25 MG TABS: 50-200-25 | 30 days supply | Qty: 30 | Fill #0

## 2020-06-01 NOTE — Addendum Note (Signed)
Addended by: Daisy Blossom, Annie Main L on: 06/01/2020 11:22 AM   Modules accepted: Orders

## 2020-06-01 NOTE — Telephone Encounter (Signed)
Called patient to schedule an appointment for the [facility]. I was unable to reach the patient so I left a HIPAA-compliant message requesting that the patient return my call.   

## 2020-06-01 NOTE — Progress Notes (Signed)
   S: Patient presents for review of their specialty medication therapy.    Patient is currently taking Biktarvy for HIV. Patient is managed by Dr. Ola Spurr for this.   Adherence: confirms.   Dosing: 1 tablet daily  Dosing: Renal Impairment: Adult  CrCl ?30 mL/minute: No dosage adjustment necessary. CrCl <30 mL/minute: Use is not recommended.  Dosing: Hepatic Impairment: Adult  Mild to moderate impairment (Child-Pugh class A or B): No dosage adjustments necessary. Severe impairment (Child-Pugh class C): Use is not recommended (has not been studied).  Drug-drug interactions: none identified   Monitoring: HIV RNA: Per [facility]where, last VL was 30 (05/19/19) CD4 count: 799 (abs - 05/19/19) S/sx of opportunistic infections: denies  Hepatotoxicity: denies  Nephrotoxicity: denies  S/sx of lactic acidosis: denies  S/sx of immune reconstitution syndrome: denies  Headache: denies  Mood changes: denies   O:     Lab Results  Component Value Date   WBC 7.2 08/10/2019   HGB 15.6 08/10/2019   HCT 44.2 08/10/2019   MCV 92.5 08/10/2019   PLT 206 08/10/2019      Chemistry      Component Value Date/Time   NA 139 08/10/2019 1415   NA 139 05/17/2019 0935   NA 139 07/21/2014 1521   K 3.5 08/10/2019 1415   K 3.9 07/21/2014 1521   CL 104 08/10/2019 1415   CL 105 07/21/2014 1521   CO2 26 08/10/2019 1415   CO2 28 07/21/2014 1521   BUN 24 (H) 08/10/2019 1415   BUN 18 05/17/2019 0935   BUN 29 (H) 07/21/2014 1521   CREATININE 1.75 (H) 08/10/2019 1415   CREATININE 1.02 07/21/2014 1521      Component Value Date/Time   CALCIUM 9.4 08/10/2019 1415   CALCIUM 8.8 (L) 07/21/2014 1521   ALKPHOS 78 05/17/2019 0935   ALKPHOS 90 07/21/2014 1521   AST 24 05/17/2019 0935   AST 24 07/21/2014 1521   ALT 30 05/17/2019 0935   ALT 19 07/21/2014 1521   BILITOT 0.7 05/17/2019 0935   BILITOT 0.2 (L) 07/21/2014 1521     No results found for: CD4TCELL, CD4TABS  Lab Results  Component  Value Date   HIV1RNAQUANT 30 05/19/2019   A/P: 1. Medication review: patient is on [place] for HIV. Reviewed the medication with the patient, including the following: Phillips Odor is a combination medication used for the treatment of HIV. Patient educated on purpose, proper use and potential adverse effects of Biktarvy. Possible adverse effects including headache, immune reconstitution syndrome, lactic acidosis,  neuropsychiatric symptoms, and renal/hepatic dysfunction. Adherence is crucial when using this drug to avoid mutations and resistance. No recommendations for changes.   Benard Halsted, PharmD, [facility] [place], [place] [phone]

## 2020-06-11 ENCOUNTER — Other Ambulatory Visit
Admission: RE | Admit: 2020-06-11 | Discharge: 2020-06-11 | Disposition: A | Discharge status: Home / Self Care | Payer: 59 | Attending: Family | Admitting: Family

## 2020-06-11 ENCOUNTER — Ambulatory Visit: Payer: 59 | Admitting: Physician Assistant

## 2020-06-11 ENCOUNTER — Other Ambulatory Visit
Admission: RE | Admit: 2020-06-11 | Discharge: 2020-06-11 | Disposition: A | Discharge status: Home / Self Care | Payer: 59 | Source: Ambulatory Visit | Attending: Physician Assistant | Admitting: Physician Assistant

## 2020-06-11 ENCOUNTER — Encounter: Payer: Self-pay | Admitting: Physician Assistant

## 2020-06-11 ENCOUNTER — Telehealth: Payer: Self-pay | Admitting: Family

## 2020-06-11 ENCOUNTER — Other Ambulatory Visit: Payer: Self-pay

## 2020-06-11 VITALS — BP 122/76 | HR 61 | Ht 70.0 in | Wt 189.0 lb

## 2020-06-11 DIAGNOSIS — R079 Chest pain, unspecified: Secondary | ICD-10-CM

## 2020-06-11 DIAGNOSIS — N183 Chronic kidney disease, stage 3 unspecified: Secondary | ICD-10-CM

## 2020-06-11 DIAGNOSIS — E785 Hyperlipidemia, unspecified: Secondary | ICD-10-CM

## 2020-06-11 DIAGNOSIS — I25118 Atherosclerotic heart disease of native coronary artery with other forms of angina pectoris: Secondary | ICD-10-CM | POA: Diagnosis not present

## 2020-06-11 DIAGNOSIS — K219 Gastro-esophageal reflux disease without esophagitis: Secondary | ICD-10-CM | POA: Diagnosis not present

## 2020-06-11 DIAGNOSIS — R0789 Other chest pain: Secondary | ICD-10-CM

## 2020-06-11 LAB — CBC
HCT: 44.4 % (ref 39.0–52.0)
Hemoglobin: 15.5 g/dL (ref 13.0–17.0)
MCH: 32.2 pg (ref 26.0–34.0)
MCHC: 34.9 g/dL (ref 30.0–36.0)
MCV: 92.1 fL (ref 80.0–100.0)
Platelets: 193 10*3/uL (ref 150–400)
RBC: 4.82 MIL/uL (ref 4.22–5.81)
RDW: 12.4 % (ref 11.5–15.5)
WBC: 6.8 10*3/uL (ref 4.0–10.5)
nRBC: 0 % (ref 0.0–0.2)

## 2020-06-11 LAB — BASIC METABOLIC PANEL
Anion gap: 7 (ref 5–15)
BUN: 21 mg/dL — ABNORMAL HIGH (ref 6–20)
CO2: 28 mmol/L (ref 22–32)
Calcium: 9.4 mg/dL (ref 8.9–10.3)
Chloride: 103 mmol/L (ref 98–111)
Creatinine, Ser: 1.4 mg/dL — ABNORMAL HIGH (ref 0.61–1.24)
GFR, Estimated: 58 mL/min — ABNORMAL LOW (ref >60)
Glucose, Bld: 92 mg/dL (ref 70–99)
Potassium: 3.7 mmol/L (ref 3.5–5.1)
Sodium: 138 mmol/L (ref 135–145)

## 2020-06-11 LAB — TROPONIN I (HIGH SENSITIVITY): Troponin I (High Sensitivity): 5 ng/L (ref <18)

## 2020-06-11 MED ORDER — SODIUM CHLORIDE 0.9% FLUSH: 3.0000 mL | Freq: Two times a day (BID) | INTRAVENOUS | Status: DC

## 2020-06-11 NOTE — Telephone Encounter (Signed)
  Please move up his office visit to this week with Dr. Rockey Situ or APP as symptoms are concerning for angina.  Recommend EKG in short stay today which I can review to evaluate for new EKG changes.  Recommend stat high sensitivity troponin in the [facility] - if elevated, will plan for ED evaluation. If normal, will see in clinic.  Recommend increasing Imdur to 60mg  daily - go ahead and take an addition 30mg  today. If chest pain recurs and does not resolve with 1-2 nitroglycerin, recommend evaluation in the ED.   Loel Dubonnet, NP

## 2020-06-11 NOTE — Telephone Encounter (Signed)
I have called the patient and spoken with him. I have advised him that our first option would be to see him in clinic today. He is agreeable with seeing Marrianne Mood, PA today at 3:30 pm.  I have asked to please go to the [facility] as soon as possible to have STAT troponin done and he is agreeable.  Order placed.

## 2020-06-11 NOTE — H&P (View-Only) (Signed)
Office Visit    Patient Name: Daniel Black Date of Encounter: 06/11/2020  PCP:  Juline Patch, MD   [place]  Cardiologist:  Ida Rogue, MD  Advanced Practice Provider:  No care team member to display Electrophysiologist:  None :[id number]}   Chief Complaint    Chief Complaint  Patient presents with  . Other    Patient c.o yesterday chest pressure when walking and today - left sided chest pain - Meds reviewed verbally with patient.     60 year old male with history of CAD s/p MI 2004 with PCI to LAD/mid RCA and repeat PCI 2010 of the mid RCA lesion, history of inferior STEMI with MI 11/2017 and PCI to LCx/RCA and AV groove as below, VF arrest during catheterization with successful defibrillation, hyperlipidemia, remote tobacco use, HIV, left finger amputation, CKDIII, nephrolithiasis, DDD, Bell's palsy, and who presents today as an add on for report of chest pain.  Past Medical History    Past Medical History:  Diagnosis Date  . Asymptomatic HIV infection ([place])   . Bell's palsy 2004  . CAD (coronary artery disease)    a. MI in 2004 w/ PCI to LAD and mRCA; b. LHC 07/2008: patent LAD stent, 30% distal LAD stenosis, 25% proximal LCx stenosis, 20% mid LCx stenosis, 95% mid RCA stenosis s/p PCI/DES (Xience 3.5 x 28 mm), and 40% distal RCA stenosis  . DDD (degenerative disc disease) 10/02   has lumbar fusion  . Erectile dysfunction   . HLD (hyperlipidemia)    mixed  . Myocardial infarction [facility]) 2004   x 3- last was July 2019 has 6 stents  . Nephrolithiasis    stage 3  . Skin cancer, basal cell    skin   Past Surgical History:  Procedure Laterality Date  . AMPUTATION  1985   L fingers reattachement   . back fusion     lumbar  . CARDIAC CATHETERIZATION     x2. 2004, 2010. 3 stents(total)  . CHOLECYSTECTOMY  3/03  . COLONOSCOPY WITH PROPOFOL N/A 06/13/2016   Procedure: COLONOSCOPY WITH PROPOFOL;  Surgeon: Lucilla Lame, MD;  Location:  [place];  Service: Endoscopy;  Laterality: N/A;  . CORONARY/GRAFT ACUTE MI REVASCULARIZATION N/A 11/16/2017   Procedure: Coronary/Graft Acute MI Revascularization;  Surgeon: [name], MD;  Location: [place] [facility];  Service: Cardiovascular;  Laterality: N/A;  . LEFT HEART CATH AND CORONARY ANGIOGRAPHY N/A 11/16/2017   Procedure: LEFT HEART CATH AND CORONARY ANGIOGRAPHY;  Surgeon: [name], MD;  Location: [place] [facility];  Service: Cardiovascular;  Laterality: N/A;  . LEFT HEART CATH AND CORONARY ANGIOGRAPHY N/A 11/19/2017   Procedure: LEFT HEART CATH AND CORONARY ANGIOGRAPHY;  Surgeon: [name], MD;  Location: [place] [facility];  Service: Cardiovascular;  Laterality: N/A;  . POLYPECTOMY  06/13/2016   Procedure: POLYPECTOMY;  Surgeon: Lucilla Lame, MD;  Location: [place];  Service: Endoscopy;;  . WRIST SURGERY  10/2011    Allergies  No Known Allergies  History of Present Illness    Daniel Black is a 60 y.o. male with PMH as above.  CAD s/p MI 2004 with PCI to LAD/mid RCA and repeat PCI 2010 of the mid RCA lesion, history of inferior STEMI with MI 11/2017 and PCI to LCx/RCA and AV groove as below, VF arrest during catheterization with successful defibrillation, hyperlipidemia, remote tobacco use, HIV, left finger amputation, CKDIII, nephrolithiasis, DDD, Bell's palsy, and  who presents today as an add on for report of chest pain.  Last seen in clinic 08/16/2019 by Laurann Montana, NP.    Prior [place] 2010 showed patent LAD stent, 30% distal LAD stenosis, 25% proximal left circumflex, and findings as below.    He was admitted 11/2017 with inferior ST elevation myocardial infarction.  Subsequent LHC 11/2017 showed severe three-vessel CAD with patent stents in the LAD and RCA.  Severe stenosis of distal RCA and posterior AV groove artery was felt to be the culprit of the STEMI.  There was evidence of embolization in the distal PL 3 and mid left  circumflex.  During the case, he had VF arrest after obtaining arterial access.  He was successfully defibrillated.  He then underwent PCI/DES to distal RCA, as well as the posterior AV groove.  Subsequent echo 11/2017 showed EF 55 to 60% mild HK of the inferior myocardium.  He underwent staged PCI of the left circumflex 11/2017 with second stent being placed overlapping proximally due to use suspected edge dissection.    Seen in the ED 08/10/2019 for chest pain.  Pain was described as midsternal and associated with nausea, lasting 30 minutes.  High-sensitivity troponin 3, 3.  Mild AKI.  Hemoglobin 15.6.  Chest x-ray without acute findings.  EKG NSR without acute ST/T changes.  When seen in clinic 08/16/2019,, he reported symptoms of chest discomfort that had been ongoing for the last few weeks.  He reported a tightness across his chest.  This feeling did not feel similar to previous anginal symptoms.  Chest tightness occurred primarily at rest.  Symptoms waxed and waned.  No clear exacerbating or alleviating factors.  He reported some dyspnea on exertion with more than usual activity, such as hiking.  This was his baseline.  He reported that he primarily went to the ED given his nausea associated with the symptoms.  He did note more indigestion recently.  He had not been eating as well as he used to in the past.  He was not taking anything for indigestion.  He was checking his BP at home with SBP 110s and DBP 80s.    It was thought his symptoms were most consistent with indigestion / atypical chest discomfort. He was started on Protonix with resolution of symptoms.  As needed sublingual nitro was provided.  It was noted that, if symptoms do not improve on PPI, Imdur could be considered.  MyChart messages reviewed and showed that Protonix did alleviate his indigestion.  He later stopped Protonix as outlined below.  On 06/11/2020, he sent a MyChart message that stated he was having some chest pain.  Sublingual  nitro did provide relief.  Call was placed the patient by staff with patient agreeable to be an add-on in clinic same day.  Stat troponin was requested before his clinic and resulted at 5, which was reviewed today and explained as a reassuring result.  Recommendation prior to clinic was to increase to Imdur 60 mg daily.  He was advised to take an additional 30 mg that same day.  On telephone follow-up by staff, he reported chest pressure that was midsternal and with his initial episode 06/10/2020.  This episode occurred with walking.  He had a second episode of more left-sided chest discomfort this morning.  He took 2 sublingual nitro with resolution of his symptoms.  Nitro were taken 30 minutes before his phone call.  During his phone call with the RN earlier today, he reported symptoms as different  from prior cardiac catheterization; however, today he notes his symptoms is more similar to those events.  BP was noted as 136/93, which was elevated for him.  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  He reported his BP typically runs 120s over 70s to 80s.  Of note, he had stopped his Protonix 1 to 2 months prior.  He had reportedly been doing well without it.  Today, he reports that his chest discomfort is different from that of his previous pain that he reported to Laurann Montana, NP.  He was subsequently added to my schedule between patients this afternoon.  He reported he is passed 2 episodes of chest pain as described above.  He had chest pain while walking yesterday.  He did not take sublingual nitro.  He then felt sick.  He again had another episode of chest pain for which she used nitro today.  He reported shortness of breath is a constant for him.  He feels as if his shortness of breath is worsening over time.  He did note concerned that his chest pain was associated with nausea  recently.  He denied that this chest pain was similar to that of his previous discomfort, for which Protonix seem to alleviate his symptoms. He only took Protonix for 1 week before d/c'd/  He does report BRBPR due to HPV. No other signs or symptoms reported consistent with bleeding.  All options for further work-up were reviewed with recommendations as below.  Home Medications    Current Outpatient Medications on File Prior to Visit  Medication Sig Dispense Refill  . aspirin 81 MG chewable tablet Chew 1 tablet (81 mg total) by mouth daily.    . bictegravir-emtricitabine-tenofovir AF (BIKTARVY) 50-200-25 MG TABS tablet Take 1 tablet by mouth daily. 30 tablet 2  . BRILINTA 60 MG TABS tablet TAKE 1 TABLET BY MOUTH TWICE DAILY 180 tablet 0  . ezetimibe (ZETIA) 10 MG tablet TAKE 1 TABLET (10 MG TOTAL) BY MOUTH DAILY. 90 tablet 2  . isosorbide mononitrate (IMDUR) 30 MG 24 hr tablet TAKE 1 TABLET BY MOUTH DAILY. 90 tablet 3  . nitroGLYCERIN (NITROSTAT) 0.4 MG SL tablet Place 1 tablet (0.4 mg total) under the tongue every 5 (five) minutes as needed for chest pain. 25 tablet 11  . pantoprazole (PROTONIX) 40 MG tablet Take 1 tablet (40 mg total) by mouth daily. 90 tablet 1  . rosuvastatin (CRESTOR) 40 MG tablet TAKE 1 TABLET (40 MG TOTAL) BY MOUTH DAILY. 90 tablet 2   No current facility-administered medications on file prior to visit.    Review of Systems    He reports chest pain, nausea, dyspnea. He denies palpitations, pnd, orthopnea, n, v, dizziness, syncope, edema, weight gain, or early satiety.   All other systems reviewed and are otherwise negative except as noted above.  Physical Exam    VS:  BP 122/76 (BP Location: Left Arm, Patient Position: Sitting, Cuff Size: Normal)   Pulse 61   Ht 5\' 10"  (1.778 m)   Wt 189 lb (85.7 kg)   SpO2 98%   BMI 27.12 kg/m  , BMI Body mass index is 27.12 kg/m. GEN: Well nourished, well developed, in no acute distress. HEENT: normal. Neck: Supple, no JVD,  carotid bruits, or masses. Cardiac: RRR, no murmurs, rubs, or gallops. No clubbing, cyanosis, edema.  Radials/DP/PT 2+ and equal bilaterally.  Respiratory:  Respirations regular and unlabored, clear to auscultation bilaterally. GI: Soft, nontender, nondistended, BS + x 4. MS: no deformity or atrophy. Skin: warm and dry, no rash. Neuro:  Strength and sensation are intact. Psych: Normal affect.  Accessory Clinical Findings    ECG personally reviewed by me today NSR, low voltage QRS, poor R wave progression in lead III and changes in 2 and aVF as seen in prior EKGs, QRS 90 ms, QTC 3 to 4 ms- no acute changes.  VITALS Reviewed today   Temp Readings from Last 3 Encounters:  09/21/19 98.1 F (36.7 C) (Oral)  08/10/19 97.9 F (36.6 C) (Oral)  05/19/19 97.9 F (36.6 C)   BP Readings from Last 3 Encounters:  06/11/20 122/76  09/21/19 138/80  08/16/19 116/82   Pulse Readings from Last 3 Encounters:  06/11/20 61  09/21/19 88  08/16/19 68    Wt Readings from Last 3 Encounters:  06/11/20 189 lb (85.7 kg)  09/21/19 190 lb (86.2 kg)  08/16/19 188 lb 2 oz (85.3 kg)     LABS  reviewed today   Lab Results  Component Value Date   WBC 6.8 06/11/2020   HGB 15.5 06/11/2020   HCT 44.4 06/11/2020   MCV 92.1 06/11/2020   PLT 193 06/11/2020   Lab Results  Component Value Date   CREATININE 1.40 (H) 06/11/2020   BUN 21 (H) 06/11/2020   NA 138 06/11/2020   K 3.7 06/11/2020   CL 103 06/11/2020   CO2 28 06/11/2020   Lab Results  Component Value Date   ALT 30 05/17/2019   AST 24 05/17/2019   ALKPHOS 78 05/17/2019   BILITOT 0.7 05/17/2019   Lab Results  Component Value Date   CHOL 159 05/17/2019   HDL 46 05/17/2019   LDLCALC 99 05/17/2019   TRIG 71 05/17/2019   CHOLHDL 2.3 05/12/2018    Lab Results  Component Value Date   HGBA1C 5.3 11/16/2017   Lab Results  Component Value Date   TSH 4.546 (H) 11/20/2017     STUDIES/PROCEDURES reviewed today   LHC 11/2017  Prox  LAD to Mid LAD lesion is 10% stenosed.  Mid LAD lesion is 30% stenosed.  Prox Cx to Mid Cx lesion is 95% stenosed.  Prox RCA to Mid RCA lesion is 20% stenosed.  Mid RCA to Dist RCA lesion is 30% stenosed.  Mid RCA lesion is 30% stenosed.  Previously placed Dist RCA drug eluting stent is widely patent.  Balloon angioplasty was performed.  Ost RPDA to RPDA lesion is 95% stenosed.  Previously placed Post Atrio-1 drug eluting stent is widely patent.  Balloon angioplasty was performed.  Post Atrio-2 lesion is 100% stenosed.  Post intervention, there is a 0% residual stenosis.  A drug-eluting stent was successfully placed using a [place] [id number].   Successful angioplasty and drug-eluting stent placement to the mid left circumflex.  A second stent was overlapped proximally due to suspected edge dissection that was covered successfully with a second stent. Recommendations: Recommend dual antiplatelet therapy with Aspirin 81mg  daily and Clopidogrel 75mg  daily long-term (beyond 12 months) because of Multiple stents..  Likely discharge home tomorrow if no complications.   Echo 11/2017 - Left ventricle: The cavity size was normal. There was mild    concentric hypertrophy. Systolic function was normal. The    estimated ejection fraction was in the range of 55% to 60%. Mild    hypokinesis of the inferior myocardium. Left ventricular  diastolic function parameters were normal.   Assessment & Plan    Multivessel Coronary artery disease with stable angina --No chest pain at the time of his visit.  Reports 2 episodes of chest pain.  Both episodes occurred with exertion.  No CP at rest. The first episode occurred on 3/6 and while walking.  The second chest pain episode occurred 3/7 with activity and for which he took 2 sublingual nitro with alleviation of symptoms.  Chest pain is concerning to him, given his associated nausea.  He reports his nausea is different from his previous  GI symptoms, which improved with Protonix.  Of note, he has not been taking Protonix for several months at this time.  He reports today that his symptoms are consistent with those prior to his interventions.  He reports worsening DOE.  EKG today without acute ST/T changes and stable abnormalities as noted in the past in inferior leads.  High-sensitivity troponin not consistent with ACS.  Risk factors for coronary insufficiency include male, age, known CAD, HLD, HTN, h/o tobacco use. We reviewed all options for further ischemic work-up, including repeat stress testing versus cardiac catheterization.  He reports that stress testing has not revealed disease for him and others that he has cared about in the past; therefore, he prefers to proceed with cardiac catheterization. This was discussed later same day with his primary cardiologist.  --Restart of PPI and increased Imdur 60mg  (already increased prior to visit as advised by Laurann Montana, NP). Continue PRN SL nitro as needed for CP. Ongoing aggressive risk factor modification with statin, Zetia. Further recommendations pending repeat LHC.  Risks and benefits of cardiac catheterization have been discussed with the patient.  These include bleeding, infection, kidney damage, stroke, heart attack, death.  The patient understands these risks and is willing to proceed.  Obtain CBC, BMET, COVID-19 test prior to catheterization.  HLD, LDL goal <70 --Continue current statin, Zetia. Previous LDL not at goal. He does admit to dietary indiscretions.  Recommend recheck of lipid and liver function per PCP or at follow-up to ensure he remains at goal from a risk factor standpoint.  CKDIII --Check labs prior to cath.  HIV --On antiviral therapy. Continue per ID.   Disposition: RTC after LHC with Dr. Melody Haver, PA-C 06/11/2020

## 2020-06-11 NOTE — Progress Notes (Unsigned)
Office Visit    Patient Name: Daniel Black Date of Encounter: 06/11/2020  PCP:  Juline Patch, MD   [place]  Cardiologist:  Ida Rogue, MD  Advanced Practice Provider:  No care team member to display Electrophysiologist:  None :[id number]}   Chief Complaint    Chief Complaint  Patient presents with  . Other    Patient c.o yesterday chest pressure when walking and today - left sided chest pain - Meds reviewed verbally with patient.     60 year old male with history of CAD s/p MI 2004 with PCI to LAD/mid RCA and repeat PCI 2010 of the mid RCA lesion, history of inferior STEMI with MI 11/2017 and PCI to LCx/RCA and AV groove as below, VF arrest during catheterization with successful defibrillation, hyperlipidemia, remote tobacco use, HIV, left finger amputation, CKDIII, nephrolithiasis, DDD, Bell's palsy, and who presents today as an add on for report of chest pain.  Past Medical History    Past Medical History:  Diagnosis Date  . Asymptomatic HIV infection ([place])   . Bell's palsy 2004  . CAD (coronary artery disease)    a. MI in 2004 w/ PCI to LAD and mRCA; b. LHC 07/2008: patent LAD stent, 30% distal LAD stenosis, 25% proximal LCx stenosis, 20% mid LCx stenosis, 95% mid RCA stenosis s/p PCI/DES (Xience 3.5 x 28 mm), and 40% distal RCA stenosis  . DDD (degenerative disc disease) 10/02   has lumbar fusion  . Erectile dysfunction   . HLD (hyperlipidemia)    mixed  . Myocardial infarction [facility]) 2004   x 3- last was July 2019 has 6 stents  . Nephrolithiasis    stage 3  . Skin cancer, basal cell    skin   Past Surgical History:  Procedure Laterality Date  . AMPUTATION  1985   L fingers reattachement   . back fusion     lumbar  . CARDIAC CATHETERIZATION     x2. 2004, 2010. 3 stents(total)  . CHOLECYSTECTOMY  3/03  . COLONOSCOPY WITH PROPOFOL N/A 06/13/2016   Procedure: COLONOSCOPY WITH PROPOFOL;  Surgeon: Lucilla Lame, MD;  Location:  [place];  Service: Endoscopy;  Laterality: N/A;  . CORONARY/GRAFT ACUTE MI REVASCULARIZATION N/A 11/16/2017   Procedure: Coronary/Graft Acute MI Revascularization;  Surgeon: [name], MD;  Location: [place] [facility];  Service: Cardiovascular;  Laterality: N/A;  . LEFT HEART CATH AND CORONARY ANGIOGRAPHY N/A 11/16/2017   Procedure: LEFT HEART CATH AND CORONARY ANGIOGRAPHY;  Surgeon: [name], MD;  Location: [place] [facility];  Service: Cardiovascular;  Laterality: N/A;  . LEFT HEART CATH AND CORONARY ANGIOGRAPHY N/A 11/19/2017   Procedure: LEFT HEART CATH AND CORONARY ANGIOGRAPHY;  Surgeon: [name], MD;  Location: [place] [facility];  Service: Cardiovascular;  Laterality: N/A;  . POLYPECTOMY  06/13/2016   Procedure: POLYPECTOMY;  Surgeon: Lucilla Lame, MD;  Location: [place];  Service: Endoscopy;;  . WRIST SURGERY  10/2011    Allergies  No Known Allergies  History of Present Illness    Daniel Black is a 60 y.o. male with PMH as above.  CAD s/p MI 2004 with PCI to LAD/mid RCA and repeat PCI 2010 of the mid RCA lesion, history of inferior STEMI with MI 11/2017 and PCI to LCx/RCA and AV groove as below, VF arrest during catheterization with successful defibrillation, hyperlipidemia, remote tobacco use, HIV, left finger amputation, CKDIII, nephrolithiasis, DDD, Bell's palsy, and  who presents today as an add on for report of chest pain.  Last seen in clinic 08/16/2019 by Laurann Montana, NP.    Prior [place] 2010 showed patent LAD stent, 30% distal LAD stenosis, 25% proximal left circumflex, and findings as below.    He was admitted 11/2017 with inferior ST elevation myocardial infarction.  Subsequent LHC 11/2017 showed severe three-vessel CAD with patent stents in the LAD and RCA.  Severe stenosis of distal RCA and posterior AV groove artery was felt to be the culprit of the STEMI.  There was evidence of embolization in the distal PL 3 and mid left  circumflex.  During the case, he had VF arrest after obtaining arterial access.  He was successfully defibrillated.  He then underwent PCI/DES to distal RCA, as well as the posterior AV groove.  Subsequent echo 11/2017 showed EF 55 to 60% mild HK of the inferior myocardium.  He underwent staged PCI of the left circumflex 11/2017 with second stent being placed overlapping proximally due to use suspected edge dissection.    Seen in the ED 08/10/2019 for chest pain.  Pain was described as midsternal and associated with nausea, lasting 30 minutes.  High-sensitivity troponin 3, 3.  Mild AKI.  Hemoglobin 15.6.  Chest x-ray without acute findings.  EKG NSR without acute ST/T changes.  When seen in clinic 08/16/2019,, he reported symptoms of chest discomfort that had been ongoing for the last few weeks.  He reported a tightness across his chest.  This feeling did not feel similar to previous anginal symptoms.  Chest tightness occurred primarily at rest.  Symptoms waxed and waned.  No clear exacerbating or alleviating factors.  He reported some dyspnea on exertion with more than usual activity, such as hiking.  This was his baseline.  He reported that he primarily went to the ED given his nausea associated with the symptoms.  He did note more indigestion recently.  He had not been eating as well as he used to in the past.  He was not taking anything for indigestion.  He was checking his BP at home with SBP 110s and DBP 80s.    It was thought his symptoms were most consistent with indigestion / atypical chest discomfort. He was started on Protonix with resolution of symptoms.  As needed sublingual nitro was provided.  It was noted that, if symptoms do not improve on PPI, Imdur could be considered.  MyChart messages reviewed and showed that Protonix did alleviate his indigestion.  He later stopped Protonix as outlined below.  On 06/11/2020, he sent a MyChart message that stated he was having some chest pain.  Sublingual  nitro did provide relief.  Call was placed the patient by staff with patient agreeable to be an add-on in clinic same day.  Stat troponin was requested before his clinic and resulted at 5, which was reviewed today and explained as a reassuring result.  Recommendation prior to clinic was to increase to Imdur 60 mg daily.  He was advised to take an additional 30 mg that same day.  On telephone follow-up by staff, he reported chest pressure that was midsternal and with his initial episode 06/10/2020.  This episode occurred with walking.  He had a second episode of more left-sided chest discomfort this morning.  He took 2 sublingual nitro with resolution of his symptoms.  Nitro were taken 30 minutes before his phone call.  During his phone call with the RN earlier today, he reported symptoms as different  from prior cardiac catheterization; however, today he notes his symptoms is more similar to those events.  BP was noted as 136/93, which was elevated for him.  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  He reported his BP typically runs 120s over 70s to 80s.  Of note, he had stopped his Protonix 1 to 2 months prior.  He had reportedly been doing well without it.  Today, he reports that his chest discomfort is different from that of his previous pain that he reported to Laurann Montana, NP.  He was subsequently added to my schedule between patients this afternoon.  He reported he is passed 2 episodes of chest pain as described above.  He had chest pain while walking yesterday.  He did not take sublingual nitro.  He then felt sick.  He again had another episode of chest pain for which she used nitro today.  He reported shortness of breath is a constant for him.  He feels as if his shortness of breath is worsening over time.  He did note concerned that his chest pain was associated with nausea  recently.  He denied that this chest pain was similar to that of his previous discomfort, for which Protonix seem to alleviate his symptoms. He only took Protonix for 1 week before d/c'd/  He does report BRBPR due to HPV. No other signs or symptoms reported consistent with bleeding.  All options for further work-up were reviewed with recommendations as below.  Home Medications    Current Outpatient Medications on File Prior to Visit  Medication Sig Dispense Refill  . aspirin 81 MG chewable tablet Chew 1 tablet (81 mg total) by mouth daily.    . bictegravir-emtricitabine-tenofovir AF (BIKTARVY) 50-200-25 MG TABS tablet Take 1 tablet by mouth daily. 30 tablet 2  . BRILINTA 60 MG TABS tablet TAKE 1 TABLET BY MOUTH TWICE DAILY 180 tablet 0  . ezetimibe (ZETIA) 10 MG tablet TAKE 1 TABLET (10 MG TOTAL) BY MOUTH DAILY. 90 tablet 2  . isosorbide mononitrate (IMDUR) 30 MG 24 hr tablet TAKE 1 TABLET BY MOUTH DAILY. 90 tablet 3  . nitroGLYCERIN (NITROSTAT) 0.4 MG SL tablet Place 1 tablet (0.4 mg total) under the tongue every 5 (five) minutes as needed for chest pain. 25 tablet 11  . pantoprazole (PROTONIX) 40 MG tablet Take 1 tablet (40 mg total) by mouth daily. 90 tablet 1  . rosuvastatin (CRESTOR) 40 MG tablet TAKE 1 TABLET (40 MG TOTAL) BY MOUTH DAILY. 90 tablet 2   No current facility-administered medications on file prior to visit.    Review of Systems    He reports chest pain, nausea, dyspnea. He denies palpitations, pnd, orthopnea, n, v, dizziness, syncope, edema, weight gain, or early satiety.   All other systems reviewed and are otherwise negative except as noted above.  Physical Exam    VS:  BP 122/76 (BP Location: Left Arm, Patient Position: Sitting, Cuff Size: Normal)   Pulse 61   Ht 5\' 10"  (1.778 m)   Wt 189 lb (85.7 kg)   SpO2 98%   BMI 27.12 kg/m  , BMI Body mass index is 27.12 kg/m. GEN: Well nourished, well developed, in no acute distress. HEENT: normal. Neck: Supple, no JVD,  carotid bruits, or masses. Cardiac: RRR, no murmurs, rubs, or gallops. No clubbing, cyanosis, edema.  Radials/DP/PT 2+ and equal bilaterally.  Respiratory:  Respirations regular and unlabored, clear to auscultation bilaterally. GI: Soft, nontender, nondistended, BS + x 4. MS: no deformity or atrophy. Skin: warm and dry, no rash. Neuro:  Strength and sensation are intact. Psych: Normal affect.  Accessory Clinical Findings    ECG personally reviewed by me today NSR, low voltage QRS, poor R wave progression in lead III and changes in 2 and aVF as seen in prior EKGs, QRS 90 ms, QTC 3 to 4 ms- no acute changes.  VITALS Reviewed today   Temp Readings from Last 3 Encounters:  09/21/19 98.1 F (36.7 C) (Oral)  08/10/19 97.9 F (36.6 C) (Oral)  05/19/19 97.9 F (36.6 C)   BP Readings from Last 3 Encounters:  06/11/20 122/76  09/21/19 138/80  08/16/19 116/82   Pulse Readings from Last 3 Encounters:  06/11/20 61  09/21/19 88  08/16/19 68    Wt Readings from Last 3 Encounters:  06/11/20 189 lb (85.7 kg)  09/21/19 190 lb (86.2 kg)  08/16/19 188 lb 2 oz (85.3 kg)     LABS  reviewed today   Lab Results  Component Value Date   WBC 6.8 06/11/2020   HGB 15.5 06/11/2020   HCT 44.4 06/11/2020   MCV 92.1 06/11/2020   PLT 193 06/11/2020   Lab Results  Component Value Date   CREATININE 1.40 (H) 06/11/2020   BUN 21 (H) 06/11/2020   NA 138 06/11/2020   K 3.7 06/11/2020   CL 103 06/11/2020   CO2 28 06/11/2020   Lab Results  Component Value Date   ALT 30 05/17/2019   AST 24 05/17/2019   ALKPHOS 78 05/17/2019   BILITOT 0.7 05/17/2019   Lab Results  Component Value Date   CHOL 159 05/17/2019   HDL 46 05/17/2019   LDLCALC 99 05/17/2019   TRIG 71 05/17/2019   CHOLHDL 2.3 05/12/2018    Lab Results  Component Value Date   HGBA1C 5.3 11/16/2017   Lab Results  Component Value Date   TSH 4.546 (H) 11/20/2017     STUDIES/PROCEDURES reviewed today   LHC 11/2017  Prox  LAD to Mid LAD lesion is 10% stenosed.  Mid LAD lesion is 30% stenosed.  Prox Cx to Mid Cx lesion is 95% stenosed.  Prox RCA to Mid RCA lesion is 20% stenosed.  Mid RCA to Dist RCA lesion is 30% stenosed.  Mid RCA lesion is 30% stenosed.  Previously placed Dist RCA drug eluting stent is widely patent.  Balloon angioplasty was performed.  Ost RPDA to RPDA lesion is 95% stenosed.  Previously placed Post Atrio-1 drug eluting stent is widely patent.  Balloon angioplasty was performed.  Post Atrio-2 lesion is 100% stenosed.  Post intervention, there is a 0% residual stenosis.  A drug-eluting stent was successfully placed using a Bentley [id number].   Successful angioplasty and drug-eluting stent placement to the mid left circumflex.  A second stent was overlapped proximally due to suspected edge dissection that was covered successfully with a second stent. Recommendations: Recommend dual antiplatelet therapy with Aspirin 81mg  daily and Clopidogrel 75mg  daily long-term (beyond 12 months) because of Multiple stents..  Likely discharge home tomorrow if no complications.   Echo 11/2017 - Left ventricle: The cavity size was normal. There was mild    concentric hypertrophy. Systolic function was normal. The    estimated ejection fraction was in the range of 55% to 60%. Mild    hypokinesis of the inferior myocardium. Left ventricular  diastolic function parameters were normal.   Assessment & Plan    Multivessel Coronary artery disease with stable angina --No chest pain at the time of his visit.  Reports 2 episodes of chest pain.  Both episodes occurred with exertion.  No CP at rest. The first episode occurred on 3/6 and while walking.  The second chest pain episode occurred 3/7 with activity and for which he took 2 sublingual nitro with alleviation of symptoms.  Chest pain is concerning to him, given his associated nausea.  He reports his nausea is different from his previous  GI symptoms, which improved with Protonix.  Of note, he has not been taking Protonix for several months at this time.  He reports today that his symptoms are consistent with those prior to his interventions.  He reports worsening DOE.  EKG today without acute ST/T changes and stable abnormalities as noted in the past in inferior leads.  High-sensitivity troponin not consistent with ACS.  Risk factors for coronary insufficiency include male, age, known CAD, HLD, HTN, h/o tobacco use. We reviewed all options for further ischemic work-up, including repeat stress testing versus cardiac catheterization.  He reports that stress testing has not revealed disease for him and others that he has cared about in the past; therefore, he prefers to proceed with cardiac catheterization. This was discussed later same day with his primary cardiologist.  --Restart of PPI and increased Imdur 60mg  (already increased prior to visit as advised by Laurann Montana, NP). Continue PRN SL nitro as needed for CP. Ongoing aggressive risk factor modification with statin, Zetia. Further recommendations pending repeat LHC.  Risks and benefits of cardiac catheterization have been discussed with the patient.  These include bleeding, infection, kidney damage, stroke, heart attack, death.  The patient understands these risks and is willing to proceed.  Obtain CBC, BMET, COVID-19 test prior to catheterization.  HLD, LDL goal <70 --Continue current statin, Zetia. Previous LDL not at goal. He does admit to dietary indiscretions.  Recommend recheck of lipid and liver function per PCP or at follow-up to ensure he remains at goal from a risk factor standpoint.  CKDIII --Check labs prior to cath.  HIV --On antiviral therapy. Continue per ID.   Disposition: RTC after LHC with Dr. Melody Haver, PA-C 06/11/2020

## 2020-06-11 NOTE — Patient Instructions (Signed)
Medication Instructions:   Your physician recommends that you continue on your current medications as directed. Please refer to the Current Medication list given to you today.  *If you need a refill on your cardiac medications before your next appointment, please call your pharmacy*   Lab Work:  Your physician recommends that you have lab work either today or tomorrow morning at the medical mall: Bmet, CBC   Testing/Procedures:   You are scheduled for a Cardiac Catheterization on ______________ at [facility] with Dr. Rockey Situ.   1. Please arrive at the [place] at [facility] at_______________ (This time is two hours before your procedure to ensure your preparation). Free valet parking service is available.   Special note: Every effort is made to have your procedure done on time. Please understand that emergencies sometimes delay scheduled procedures.  2. Diet: Do not eat solid foods after midnight.  The patient may have clear liquids until 5am upon the day of the procedure.  3. Labs: We will get Bmet and CBC today  4. Medication instructions in preparation for your procedure:   Contrast Allergy: No  On the morning of your procedure, take your Aspirin and Brilinta/Ticagrelor and any morning medicines NOT listed above.  You may use sips of water.  5. Plan for one night stay--bring personal belongings. 6. Bring a current list of your medications and current insurance cards. 7. You MUST have a responsible person to drive you home. 8. Someone MUST be with you the first 24 hours after you arrive home or your discharge will be delayed. 9. Please wear clothes that are easy to get on and off and wear slip-on shoes.  Thank you for allowing [place] to care for you!   -- [place] [facility]   COVID PRE- TEST: You will need a COVID TEST prior to the procedure:  LOCATION: [facility] site.  DATE/TIME: ______________  (anytime between 8  am and 1 pm)     Follow-Up: At [facility], you and your health needs are our priority.  As part of our continuing mission to provide you with exceptional heart care, we have created designated Provider Care Teams.  These Care Teams include your primary Cardiologist (physician) and Advanced Practice Providers (APPs -  Physician Assistants and Nurse Practitioners) who all work together to provide you with the care you need, when you need it.  We recommend signing up for the patient portal called "MyChart".  Sign up information is provided on this After Visit Summary.  MyChart is used to connect with patients for Virtual Visits (Telemedicine).  Patients are able to view lab/test results, encounter notes, upcoming appointments, etc.  Non-urgent messages can be sent to your provider as well.   To learn more about what you can do with MyChart, go to [email or web address].    Your next appointment:    Follow up after cath  The format for your next appointment:   In Person  Provider:   You may see Ida Rogue, MD or one of the following Advanced Practice Providers on your designated Care Team:    Murray Hodgkins, NP  Christell Faith, PA-C  Marrianne Mood, PA-C  Cadence Cortez, [place]  Laurann Montana, NP

## 2020-06-11 NOTE — Progress Notes (Incomplete)
Office Visit    Patient Name: Daniel Black Date of Encounter: 06/11/2020  PCP:  Juline Patch, MD   [place]  Cardiologist:  Ida Rogue, MD  Advanced Practice Provider:  No care team member to display Electrophysiologist:  None :[phone]}   Daniel Complaint    Daniel Complaint  Patient presents with  . Other    Patient c.o yesterday chest pressure when walking and today - left sided chest pain - Meds reviewed verbally with patient.     60 year old male with history of CAD s/p MI 2004 with PCI to LAD/mid RCA and PCI 2010 of the mid RCA lesion, and who presents today as an add on for report of chest pain.  Past Medical History    Past Medical History:  Diagnosis Date  . Asymptomatic HIV infection ([place])   . Bell's palsy 2004  . CAD (coronary artery disease)    a. MI in 2004 w/ PCI to LAD and mRCA; b. LHC 07/2008: patent LAD stent, 30% distal LAD stenosis, 25% proximal LCx stenosis, 20% mid LCx stenosis, 95% mid RCA stenosis s/p PCI/DES (Xience 3.5 x 28 mm), and 40% distal RCA stenosis  . DDD (degenerative disc disease) 10/02   has lumbar fusion  . Erectile dysfunction   . HLD (hyperlipidemia)    mixed  . Myocardial infarction [facility]) 2004   x 3- last was July 2019 has 6 stents  . Nephrolithiasis    stage 3  . Skin cancer, basal cell    skin   Past Surgical History:  Procedure Laterality Date  . AMPUTATION  1985   L fingers reattachement   . back fusion     lumbar  . CARDIAC CATHETERIZATION     x2. 2004, 2010. 3 stents(total)  . CHOLECYSTECTOMY  3/03  . COLONOSCOPY WITH PROPOFOL N/A 06/13/2016   Procedure: COLONOSCOPY WITH PROPOFOL;  Surgeon: Lucilla Lame, MD;  Location: [facility];  Service: Endoscopy;  Laterality: N/A;  . CORONARY/GRAFT ACUTE MI REVASCULARIZATION N/A 11/16/2017   Procedure: Coronary/Graft Acute MI Revascularization;  Surgeon: [name], MD;  Location: [facility];  Service: Cardiovascular;   Laterality: N/A;  . LEFT HEART CATH AND CORONARY ANGIOGRAPHY N/A 11/16/2017   Procedure: LEFT HEART CATH AND CORONARY ANGIOGRAPHY;  Surgeon: [name], MD;  Location: [facility];  Service: Cardiovascular;  Laterality: N/A;  . LEFT HEART CATH AND CORONARY ANGIOGRAPHY N/A 11/19/2017   Procedure: LEFT HEART CATH AND CORONARY ANGIOGRAPHY;  Surgeon: [name], MD;  Location: [facility];  Service: Cardiovascular;  Laterality: N/A;  . POLYPECTOMY  06/13/2016   Procedure: POLYPECTOMY;  Surgeon: Lucilla Lame, MD;  Location: [facility];  Service: Endoscopy;;  . WRIST SURGERY  10/2011    Allergies  No Known Allergies  History of Present Illness    Daniel Black is a 60 y.o. male with PMH as above.  He has history of CAD s/p MI 2004 with PCI to the LAD/mid RCA and PCI 2010 of the mid RCA lesion, HIV, hyperlipidemia, remote tobacco use, and was last seen in clinic 08/16/2019 by Laurann Montana, NP.    LHC 11/2017 with angioplasty and DES to mid left circumflex.  Seen in the ED 08/10/2019 for chest pain.  Pain was described as midsternal and associated with nausea, lasting 30 minutes.  High-sensitivity troponin 3, 3.  Mild AKI.  Hemoglobin 15.6.  Chest x-ray without acute findings.  EKG  NSR without acute ST/T changes.  When seen in clinic 08/16/2019,, he reported symptoms of chest discomfort that had been ongoing for the last few weeks.  He reported a tightness across his chest.  This feeling did not feel similar to previous anginal symptoms.  Chest tightness occurred primarily at rest.  Symptoms waxed and waned.  No clear exacerbating or alleviating factors.  He reported some dyspnea on exertion with more than usual activity, such as hiking.  This was his baseline.  He reported that he primarily went to the ED given his nausea associated with the symptoms.  He did note more indigestion recently.  He had not been eating as well as he used to in the past.  He was not  taking anything for indigestion.  He was checking his BP at home with SBP 110s and DBP 80s.    It was thought his symptoms were most consistent with indigestion / atypical chest discomfort. He was started on Protonix with resolution of symptoms.  As needed sublingual nitro was provided.  It was noted that, if symptoms do not improve on PPI, Imdur could be considered.  MyChart messages reviewed and showed that Protonix did alleviate his indigestion.  On 06/12/2020, he sent a subsequent MyChart message but stated he was having some chest pain.  Sublingual nitro did provide relief.  Call was placed the patient by staff with patient agreeable to be an add-on in clinic same day.  Stat troponin was requested before his clinic and resulted at 5, which was reviewed today and explained as a reassuring result.  He contacted the office today noting chest discomfort for which he had taken nitroglycerin.   **option increase imdur vs cath vs stress test pending symptoms   LHC 11/2017  Prox LAD to Mid LAD lesion is 10% stenosed.  Mid LAD lesion is 30% stenosed.  Prox Cx to Mid Cx lesion is 95% stenosed.  Prox RCA to Mid RCA lesion is 20% stenosed.  Mid RCA to Dist RCA lesion is 30% stenosed.  Mid RCA lesion is 30% stenosed.  Previously placed Dist RCA drug eluting stent is widely patent.  Balloon angioplasty was performed.  Ost RPDA to RPDA lesion is 95% stenosed.  Previously placed Post Atrio-1 drug eluting stent is widely patent.  Balloon angioplasty was performed.  Post Atrio-2 lesion is 100% stenosed.  Post intervention, there is a 0% residual stenosis.  A drug-eluting stent was successfully placed using a [place] [id number].   Successful angioplasty and drug-eluting stent placement to the mid left circumflex.  A second stent was overlapped proximally due to suspected edge dissection that was covered successfully with a second stent.   Recommendations:   Recommend dual  antiplatelet therapy with Aspirin 81mg  daily and Clopidogrel 75mg  daily long-term (beyond 12 months) because of Multiple stents..    Likely discharge home tomorrow if no complications.   Echo 11/2017 - Left ventricle: The cavity size was normal. There was mild    concentric hypertrophy. Systolic function was normal. The    estimated ejection fraction was in the range of 55% to 60%. Mild    hypokinesis of the inferior myocardium. Left ventricular    diastolic function parameters were normal.   Cp while walling yesterday  Didn't take  Didn't breathe and then felt sick And cp whle active  Used nitro Sob is a constant  Didn't take anything for it   Getting worse    Home Medications    Current Outpatient  Medications on File Prior to Visit  Medication Sig Dispense Refill  . aspirin 81 MG chewable tablet Chew 1 tablet (81 mg total) by mouth daily.    . bictegravir-emtricitabine-tenofovir AF (BIKTARVY) 50-200-25 MG TABS tablet Take 1 tablet by mouth daily. 30 tablet 2  . BRILINTA 60 MG TABS tablet TAKE 1 TABLET BY MOUTH TWICE DAILY 180 tablet 0  . ezetimibe (ZETIA) 10 MG tablet TAKE 1 TABLET (10 MG TOTAL) BY MOUTH DAILY. 90 tablet 2  . isosorbide mononitrate (IMDUR) 30 MG 24 hr tablet TAKE 1 TABLET BY MOUTH DAILY. 90 tablet 3  . nitroGLYCERIN (NITROSTAT) 0.4 MG SL tablet Place 1 tablet (0.4 mg total) under the tongue every 5 (five) minutes as needed for chest pain. 25 tablet 11  . pantoprazole (PROTONIX) 40 MG tablet Take 1 tablet (40 mg total) by mouth daily. 90 tablet 1  . rosuvastatin (CRESTOR) 40 MG tablet TAKE 1 TABLET (40 MG TOTAL) BY MOUTH DAILY. 90 tablet 2   No current facility-administered medications on file prior to visit.    Review of Systems    ***.   All other systems reviewed and are otherwise negative except as noted above.  Physical Exam    VS:  BP 122/76 (BP Location: Left Arm, Patient Position: Sitting, Cuff Size: Normal)   Pulse 61   Ht 5\' 10"  (1.778 m)   Wt  189 lb (85.7 kg)   SpO2 98%   BMI 27.12 kg/m  , BMI Body mass index is 27.12 kg/m. GEN: Well nourished, well developed, in no acute distress. HEENT: normal. Neck: Supple, no JVD, carotid bruits, or masses. Cardiac: RRR, no murmurs, rubs, or gallops. No clubbing, cyanosis, edema.  Radials/DP/PT 2+ and equal bilaterally.  Respiratory:  Respirations regular and unlabored, clear to auscultation bilaterally. GI: Soft, nontender, nondistended, BS + x 4. MS: no deformity or atrophy. Skin: warm and dry, no rash. Neuro:  Strength and sensation are intact. Psych: Normal affect.  Accessory Clinical Findings    ECG personally reviewed by me today - ***NSR, low voltage QRS, poor R wave progression in lead III and changes in 2 and aVF as seen in prior EKGs, QRS 90 ms, QTC 3 to 4 ms- no acute changes.  VITALS Reviewed today   Temp Readings from Last 3 Encounters:  09/21/19 98.1 F (36.7 C) (Oral)  08/10/19 97.9 F (36.6 C) (Oral)  05/19/19 97.9 F (36.6 C)   BP Readings from Last 3 Encounters:  06/11/20 122/76  09/21/19 138/80  08/16/19 116/82   Pulse Readings from Last 3 Encounters:  06/11/20 61  09/21/19 88  08/16/19 68    Wt Readings from Last 3 Encounters:  06/11/20 189 lb (85.7 kg)  09/21/19 190 lb (86.2 kg)  08/16/19 188 lb 2 oz (85.3 kg)     LABS  reviewed today   Lab Results  Component Value Date   WBC 6.8 06/11/2020   HGB 15.5 06/11/2020   HCT 44.4 06/11/2020   MCV 92.1 06/11/2020   PLT 193 06/11/2020   Lab Results  Component Value Date   CREATININE 1.40 (H) 06/11/2020   BUN 21 (H) 06/11/2020   NA 138 06/11/2020   K 3.7 06/11/2020   CL 103 06/11/2020   CO2 28 06/11/2020   Lab Results  Component Value Date   ALT 30 05/17/2019   AST 24 05/17/2019   ALKPHOS 78 05/17/2019   BILITOT 0.7 05/17/2019   Lab Results  Component Value Date   CHOL 159  05/17/2019   HDL 46 05/17/2019   LDLCALC 99 05/17/2019   TRIG 71 05/17/2019   CHOLHDL 2.3 05/12/2018     Lab Results  Component Value Date   HGBA1C 5.3 11/16/2017   Lab Results  Component Value Date   TSH 4.546 (H) 11/20/2017     STUDIES/PROCEDURES reviewed today   ***  Assessment & Plan    *** LHC  Risks and benefits of cardiac catheterization have been discussed with the patient.  These include bleeding, infection, kidney damage, stroke, heart attack, death.  The patient understands these risks and is willing to proceed.  Case discussed with Dr. Rockey Situ  Medication changes: *** Labs ordered: *** Studies / Imaging ordered: *** Future considerations: *** Disposition: ***  Total time spent with patient today *** minutes. This includes reviewing records, evaluating the patient, and coordinating care. Face-to-face time >50%.    Arvil Chaco, PA-C 06/11/2020

## 2020-06-11 NOTE — Telephone Encounter (Signed)
The patient sent a MyChart message at ~ 1252 today stating he was having a "little chest pain" but that  NTG was helping.  I reached out to the patient by phone and was able to speak with him. He advised that he was having some chest pressure, more mid sternal yesterday, with walking.  He has also had an episode of more left sided chest pain today. He took 2 NTG with resolution of symptoms.  He took these ~ 30 minutes ago.  He denies any other associated symptoms.  I inquired if these symptoms feel similar as prior to his previous events and he stated they did not.   I advised the patient that per Urban Gibson, NP's office note from 08/2019, she may possibly want to increase his imdur to 60 mg daily if symptoms of chest pain persisted.  The patient confirms he is still taking imdur 30 mg once daily.  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 His BP at work today is 136/93, which he states is a little high for him. Per his report he will typically run 120's/70-80s.  He states he also stopped his protonix ~ 1-2 months ago and has been doing well without it.  I advised the patient I will forward this message to Laurann Montana, NP for further advisement and call him back.  He voices understanding and is agreeable.

## 2020-06-12 ENCOUNTER — Other Ambulatory Visit
Admission: RE | Admit: 2020-06-12 | Discharge: 2020-06-12 | Disposition: A | Discharge status: Home / Self Care | Payer: 59 | Source: Ambulatory Visit | Attending: Cardiovascular Disease | Admitting: Cardiovascular Disease

## 2020-06-12 ENCOUNTER — Telehealth: Payer: Self-pay | Admitting: *Deleted

## 2020-06-12 DIAGNOSIS — Z01812 Encounter for preprocedural laboratory examination: Secondary | ICD-10-CM | POA: Diagnosis not present

## 2020-06-12 DIAGNOSIS — Z20822 Contact with and (suspected) exposure to covid-19: Secondary | ICD-10-CM | POA: Diagnosis not present

## 2020-06-12 LAB — SARS CORONAVIRUS 2 (TAT 6-24 HRS): SARS Coronavirus 2: NEGATIVE

## 2020-06-12 NOTE — Telephone Encounter (Signed)
Pt seen in clinic yesterday by Elenor Quinones, PA. Pt had labs and told would be called this morning to scheduled heart cath. Procedure instructions reviewed at visit. Spoke to pt this morning and notified left heart cath has been scheduled with Dr. Rockey Situ this Thursday 3/10 at (1130) will need to arrive at 1030. Pt will works at [employer] and will have Covid test completed today between 8AM-1PM. Pt has no further questions at this time. Procedure details below.  You are scheduled for a Cardiac Catheterization on Thursday 06/14/20 at [facility] with Dr. Rockey Situ.   1. Please arrive at the [place] at [facility] at 10:30 AM (This time is one hour before your procedure to ensure your preparation). Free valet parking service is available.   Special note: Every effort is made to have your procedure done on time. Please understand that emergencies sometimes delay scheduled procedures.  2. Diet: Do not eat solid foods after midnight.  The patient may have clear liquids until 5am upon the day of the procedure.  3. Labs: We will get Bmet and CBC today  4. Medication instructions in preparation for your procedure:   Contrast Allergy: No  On the morning of your procedure, take your Aspirin and Brilinta/Ticagrelor and any morning medicines NOT listed above.  You may use sips of water.  5. Plan for one night stay--bring personal belongings. 6. Bring a current list of your medications and current insurance cards. 7. You MUST have a responsible person to drive you home. 8. Someone MUST be with you the first 24 hours after you arrive home or your discharge will be delayed. 9. Please wear clothes that are easy to get on and off and wear slip-on shoes.  Thank you for allowing [place] to care for you!   -- [facility]   COVID PRE- TEST: You will need a COVID TEST prior to the procedure:             LOCATION: [facility].              DATE/TIME: 06/12/20 (anytime between 8 am and 1 pm)

## 2020-06-14 ENCOUNTER — Other Ambulatory Visit: Payer: Self-pay

## 2020-06-14 ENCOUNTER — Ambulatory Visit
Admission: RE | Admit: 2020-06-14 | Discharge: 2020-06-14 | Disposition: A | Discharge status: Home / Self Care | Payer: 59 | Attending: Cardiovascular Disease | Admitting: Cardiovascular Disease

## 2020-06-14 ENCOUNTER — Encounter
Admission: RE | Disposition: A | Discharge status: Home / Self Care | Payer: Self-pay | Source: Home / Self Care | Attending: Cardiovascular Disease

## 2020-06-14 ENCOUNTER — Encounter: Payer: Self-pay | Admitting: Cardiovascular Disease

## 2020-06-14 DIAGNOSIS — R079 Chest pain, unspecified: Secondary | ICD-10-CM

## 2020-06-14 DIAGNOSIS — Z955 Presence of coronary angioplasty implant and graft: Secondary | ICD-10-CM | POA: Insufficient documentation

## 2020-06-14 DIAGNOSIS — E785 Hyperlipidemia, unspecified: Secondary | ICD-10-CM | POA: Insufficient documentation

## 2020-06-14 DIAGNOSIS — N183 Chronic kidney disease, stage 3 unspecified: Secondary | ICD-10-CM | POA: Diagnosis not present

## 2020-06-14 DIAGNOSIS — Z21 Asymptomatic human immunodeficiency virus [HIV] infection status: Secondary | ICD-10-CM | POA: Insufficient documentation

## 2020-06-14 DIAGNOSIS — I200 Unstable angina: Secondary | ICD-10-CM

## 2020-06-14 DIAGNOSIS — I129 Hypertensive chronic kidney disease with stage 1 through stage 4 chronic kidney disease, or unspecified chronic kidney disease: Secondary | ICD-10-CM | POA: Insufficient documentation

## 2020-06-14 DIAGNOSIS — I2510 Atherosclerotic heart disease of native coronary artery without angina pectoris: Secondary | ICD-10-CM | POA: Diagnosis present

## 2020-06-14 DIAGNOSIS — I25110 Atherosclerotic heart disease of native coronary artery with unstable angina pectoris: Secondary | ICD-10-CM | POA: Insufficient documentation

## 2020-06-14 DIAGNOSIS — Z7982 Long term (current) use of aspirin: Secondary | ICD-10-CM | POA: Insufficient documentation

## 2020-06-14 DIAGNOSIS — Z79899 Other long term (current) drug therapy: Secondary | ICD-10-CM | POA: Insufficient documentation

## 2020-06-14 HISTORY — PX: LEFT HEART CATH AND CORONARY ANGIOGRAPHY: CATH118249

## 2020-06-14 SURGERY — LEFT HEART CATH AND CORONARY ANGIOGRAPHY
Anesthesia: Moderate Sedation | Laterality: Left

## 2020-06-14 MED ORDER — SODIUM CHLORIDE 0.9 % IV SOLN: 250.0000 mL | INTRAVENOUS | Status: DC | PRN

## 2020-06-14 MED ORDER — ONDANSETRON HCL 4 MG/2ML IJ SOLN: 4.0000 mg | Freq: Four times a day (QID) | INTRAMUSCULAR | Status: DC | PRN

## 2020-06-14 MED ORDER — FENTANYL CITRATE (PF) 100 MCG/2ML IJ SOLN
INTRAMUSCULAR | Status: AC
  Filled 2020-06-14: qty 2

## 2020-06-14 MED ORDER — SODIUM CHLORIDE 0.9 % WEIGHT BASED INFUSION
3.0000 mL/kg/h | INTRAVENOUS | Status: AC
  Administered 2020-06-14: 3 mL/kg/h via INTRAVENOUS

## 2020-06-14 MED ORDER — HYDRALAZINE HCL 20 MG/ML IJ SOLN: 10.0000 mg | INTRAMUSCULAR | Status: DC | PRN

## 2020-06-14 MED ORDER — MIDAZOLAM HCL 2 MG/2ML IJ SOLN
INTRAMUSCULAR | Status: DC | PRN
  Administered 2020-06-14 (×2): 1 mg via INTRAVENOUS

## 2020-06-14 MED ORDER — SODIUM CHLORIDE 0.9% FLUSH: 3.0000 mL | INTRAVENOUS | Status: DC | PRN

## 2020-06-14 MED ORDER — FENTANYL CITRATE (PF) 100 MCG/2ML IJ SOLN
INTRAMUSCULAR | Status: DC | PRN
  Administered 2020-06-14 (×2): 25 ug via INTRAVENOUS

## 2020-06-14 MED ORDER — MIDAZOLAM HCL 2 MG/2ML IJ SOLN
INTRAMUSCULAR | Status: AC
  Filled 2020-06-14: qty 2

## 2020-06-14 MED ORDER — LIDOCAINE HCL (PF) 1 % IJ SOLN
INTRAMUSCULAR | Status: AC
  Filled 2020-06-14: qty 30

## 2020-06-14 MED ORDER — HEPARIN (PORCINE) IN NACL 1000-0.9 UT/500ML-% IV SOLN
INTRAVENOUS | Status: DC | PRN
  Administered 2020-06-14 (×2): 500 mL

## 2020-06-14 MED ORDER — ACETAMINOPHEN 325 MG PO TABS: 650.0000 mg | ORAL_TABLET | ORAL | Status: DC | PRN

## 2020-06-14 MED ORDER — IOHEXOL 300 MG/ML  SOLN
INTRAMUSCULAR | Status: DC | PRN
  Administered 2020-06-14: 82 mL

## 2020-06-14 MED ORDER — SODIUM CHLORIDE 0.9 % WEIGHT BASED INFUSION: 1.0000 mL/kg/h | INTRAVENOUS | Status: DC

## 2020-06-14 MED ORDER — HEPARIN (PORCINE) IN NACL 1000-0.9 UT/500ML-% IV SOLN
INTRAVENOUS | Status: AC
  Filled 2020-06-14: qty 1000

## 2020-06-14 MED ORDER — ASPIRIN 81 MG PO CHEW: 81.0000 mg | CHEWABLE_TABLET | ORAL | Status: DC

## 2020-06-14 MED ORDER — LIDOCAINE HCL (PF) 1 % IJ SOLN
INTRAMUSCULAR | Status: DC | PRN
  Administered 2020-06-14: 8 mL

## 2020-06-14 MED ORDER — SODIUM CHLORIDE 0.9% FLUSH: 3.0000 mL | Freq: Two times a day (BID) | INTRAVENOUS | Status: DC

## 2020-06-14 MED ORDER — LABETALOL HCL 5 MG/ML IV SOLN
10.0000 mg | INTRAVENOUS | Status: DC | PRN
Start: 2020-06-14 — End: 2020-06-14

## 2020-06-14 SURGICAL SUPPLY — 11 items
CATH INFINITI 5FR ANG PIGTAIL (CATHETERS) ×2 IMPLANT
CATH INFINITI 5FR JL4 (CATHETERS) ×2 IMPLANT
CATH INFINITI JR4 5F (CATHETERS) ×2 IMPLANT
COVER EZ STRL 42X30 (DRAPES) ×2 IMPLANT
DEVICE CLOSURE MYNXGRIP 5F (Vascular Products) ×2 IMPLANT
KIT SYRINGE INJ CVI SPIKEX1 (MISCELLANEOUS) ×2 IMPLANT
NEEDLE PERC 18GX7CM (NEEDLE) ×2 IMPLANT
PACK CARDIAC CATH (CUSTOM PROCEDURE TRAY) ×2 IMPLANT
SET ATX SIMPLICITY (MISCELLANEOUS) ×2 IMPLANT
SHEATH AVANTI 5FR X 11CM (SHEATH) ×2 IMPLANT
WIRE GUIDERIGHT .035X150 (WIRE) ×2 IMPLANT

## 2020-06-14 NOTE — Discharge Instructions (Signed)
Angiogram, Care After This sheet gives you information about how to care for yourself after your procedure. Your health care provider may also give you more specific instructions. If you have problems or questions, contact your health care provider. What can I expect after the procedure? After the procedure, it is common to have:  Bruising and tenderness at the catheter insertion area.  A collection of blood (hematoma) at the insertion area. This may feel like a small lump under the skin at the insertion site. Follow these instructions at home: Insertion site care  Follow instructions from your health care provider about how to take care of your insertion site. Make sure you: ? Wash your hands with soap and water before and after you change your bandage (dressing). If soap and water are not available, use hand sanitizer. ? Change your dressing as told by your health care provider.  Do not take baths, swim, or use a hot tub until your health care provider approves.  You may shower 24-48 hours after the procedure, or as told by your health care provider. To clean the insertion site: ? Gently wash the area with plain soap and water. ? Pat the area dry with a clean towel. ? Do not rub the site. This may cause bleeding.  Check your insertion site every day for signs of infection. Check for: ? Redness, swelling, or pain. ? Fluid or blood. ? Warmth. ? Pus or a bad smell.  Do not apply powder or lotion to the site. Keep the site clean and dry.   Activity  Do not drive for 24 hours if you were given a sedative during your procedure.  Rest as told by your health care provider, usually for 1-2 days.  Do not lift anything that is heavier than 10 lb (4.5 kg), or the limit that you are told, until your health care provider says that it is safe.  If the insertion site was in your leg, try to avoid stairs for a few days.  Return to your normal activities as told by your health care provider,  usually in about a week. Ask your health care provider what activities are safe for you. General instructions  If your insertion site starts bleeding, lie flat and put pressure on the site. If the bleeding does not stop, get help right away. This is a medical emergency.  Take over-the-counter and prescription medicines only as told by your health care provider.  Drink enough fluid to keep your urine pale yellow. This helps flush the contrast dye from your body.  Keep all follow-up visits as told by your health care provider. This is important.   Contact a health care provider if:  You have a fever or chills.  You have redness, swelling, or pain around your insertion site.  You have fluid or blood coming from your insertion site.  Your insertion site feels warm to the touch.  You have pus or a bad smell coming from your insertion site.  You have more bruising around the insertion site. Get help right away if you have:  A problem with the insertion area, such as: ? The area swells fast or bleeds even after you apply pressure. ? The area becomes pale, cool, tingly, or numb.  Chest pain.  Trouble breathing.  A rash.  Any symptoms of a stroke. "BE FAST" is an easy way to remember the main warning signs of a stroke: ? B - Balance. Signs are dizziness, sudden trouble walking,   or loss of balance. ? E - Eyes. Signs are trouble seeing or a sudden change in vision. ? F - Face. Signs are sudden weakness or loss of feeling of the face, or the face or eyelid drooping on one side. ? A - Arms. Signs are weakness or loss of feeling in an arm. This happens suddenly and usually on one side of the body. ? S - Speech. Signs are sudden trouble speaking, slurred speech, or trouble understanding what people say. ? T - Time. Time to call emergency services. Write down what time symptoms started.  You have other signs of a stroke, such as: ? A sudden, severe headache with no known cause. ? Nausea  or vomiting. ? Seizure. These symptoms may represent a serious problem that is an emergency. Do not wait to see if the symptoms will go away. Get medical help right away. Call your local emergency services (911 in the [place] Do not drive yourself to the hospital. Summary  It is common to have bruising and tenderness at the catheter insertion area.  Do not take baths, swim, or use a hot tub until your health care provider approves. You may shower 24-48 hours after the procedure or as told.  It is important to rest and drink plenty of fluids.  If the insertion site bleeds, lie flat and put pressure on the site. If the bleeding continues, get help right away. This is a medical emergency. This information is not intended to replace advice given to you by your health care provider. Make sure you discuss any questions you have with your health care provider. Document Revised: 01/26/2019 Document Reviewed: 01/26/2019 Elsevier Patient Education  2021 [employer]  

## 2020-06-17 NOTE — Interval H&P Note (Signed)
History and Physical Interval Note:  06/17/2020 7:32 PM  Daniel Black  has presented today for surgery, with the diagnosis of LT Cath with LV, CAD, hx of PCI, unstable angina.  The various methods of treatment have been discussed with the patient and family. After consideration of risks, benefits and other options for treatment, the patient has consented to  Procedure(s): LEFT HEART CATH AND CORONARY ANGIOGRAPHY (Left) as a surgical intervention.  The patient's history has been reviewed, patient examined, no change in status, stable for surgery.  I have reviewed the patient's chart and labs.  Questions were answered to the patient's satisfaction.     Ida Rogue

## 2020-06-17 NOTE — H&P (Signed)
H&P Addendum, precardiac catheterization  Patient was seen and evaluated prior to Cardiac catheterization procedure Symptoms, prior testing details again confirmed with the patient Patient examined, no significant change from prior exam Lab work reviewed in detail personally by myself Patient understands risk and benefit of the procedure, willing to proceed  Signed, Tim Miaya Lafontant, MD, Ph.D [facility]    

## 2020-06-29 ENCOUNTER — Other Ambulatory Visit (HOSPITAL_COMMUNITY): Payer: Self-pay

## 2020-07-01 NOTE — Progress Notes (Signed)
Cardiology Office Note  Date:  07/02/2020   ID:  Daniel Black, DOB [birth year], MRN [id number]  PCP:  Daniel Patch, MD   Chief Complaint  Patient presents with  . Other    Follow up post Cath. Meds reviewed verbally with patient.     HPI:  Daniel Black is a very pleasant 61 year old gentleman with  remote smoking history,  coronary artery disease,  MI in 2004 with PCI to the LAD and mid RCA at that time,  PCI in 2010 of the mid RCA lesion,  hyperlipidemia, HIV who is on antivirals   who presents for routine follow up of his coronary artery disease.  Seen by one of our providers 06/11/2020 Reports increasing anginal sx Cardiac cath arranged:performed:  Cardiac Catheterization  Patent stents LAD, circumflex, RCA Mid to distal LAD with moderate disease, tapers significantly from proximal to mid region, small caliber vessel mid to distal Normal ejection fraction, no significant aortic valve stenosis No significant change compared to prior catheterization 2019, unable to exclude slight progression of mid LAD disease  Recommendations:  Aggressive medical management recommended, Continue isosorbide, aspirin, high intensity statin with Zetia  In follow-up today reports he is restarted his walking program Has not been doing as much regular exercise as in the past, weight has trended back upThough still acceptable  Reports compliance with his Crestor Zetia LDL in the 90s, discussed  Tolerating aspirin, Brilinta  Past medical history reviewed STEMI 11/2017 Stent to the distal RCA and posterior AV groove segment Status post staged PCI to the LCx  11/19/2017., second stent placed for suspected edge dissection  Cardiac catheterization report from April 2010 details patent LAD stent, 30% distal LAD, 25% proximal left circumflex, 20% mid circumflex, 95% mid RCA, 40% distal RCA, 30% right PDA. Xience 3.5 x 28 mm DES stent placed to the mid RCA.     PMH:   has a past medical  history of Asymptomatic HIV infection (Daniel Black), Bell's palsy (2004), CAD (coronary artery disease), DDD (degenerative disc disease) (10/02), Erectile dysfunction, HLD (hyperlipidemia), Myocardial infarction (Daniel Black) (2004), Nephrolithiasis, and Skin cancer, basal cell.  PSH:    Past Surgical History:  Procedure Laterality Date  . AMPUTATION  1985   L fingers reattachement   . back fusion     lumbar  . CARDIAC CATHETERIZATION     x2. 2004, 2010. 3 stents(total)  . CHOLECYSTECTOMY  3/03  . COLONOSCOPY WITH PROPOFOL N/A 06/13/2016   Procedure: COLONOSCOPY WITH PROPOFOL;  Surgeon: Daniel Lame, MD;  Location: [facility];  Service: Endoscopy;  Laterality: N/A;  . CORONARY/GRAFT ACUTE MI REVASCULARIZATION N/A 11/16/2017   Procedure: Coronary/Graft Acute MI Revascularization;  Surgeon: Daniel Hampshire, MD;  Location: [facility];  Service: Cardiovascular;  Laterality: N/A;  . LEFT HEART CATH AND CORONARY ANGIOGRAPHY N/A 11/16/2017   Procedure: LEFT HEART CATH AND CORONARY ANGIOGRAPHY;  Surgeon: Daniel Hampshire, MD;  Location: [facility];  Service: Cardiovascular;  Laterality: N/A;  . LEFT HEART CATH AND CORONARY ANGIOGRAPHY N/A 11/19/2017   Procedure: LEFT HEART CATH AND CORONARY ANGIOGRAPHY;  Surgeon: Daniel Hampshire, MD;  Location: [facility];  Service: Cardiovascular;  Laterality: N/A;  . LEFT HEART CATH AND CORONARY ANGIOGRAPHY Left 06/14/2020   Procedure: LEFT HEART CATH AND CORONARY ANGIOGRAPHY;  Surgeon: Daniel Merritts, MD;  Location: [facility];  Service: Cardiovascular;  Laterality: Left;  . POLYPECTOMY  06/13/2016   Procedure: POLYPECTOMY;  Surgeon: Daniel Lame,  MD;  Location: [facility];  Service: Endoscopy;;  . WRIST SURGERY  10/2011    Current Outpatient Medications  Medication Sig Dispense Refill  . acetaminophen (TYLENOL) 325 MG tablet Take 650 mg by mouth every 6 (six) hours as needed for moderate pain or headache.    Marland Kitchen aspirin  81 MG chewable tablet Chew 1 tablet (81 mg total) by mouth daily.    . bictegravir-emtricitabine-tenofovir AF (BIKTARVY) 50-200-25 MG TABS tablet Take 1 tablet by mouth daily. 30 tablet 2  . diphenhydramine-acetaminophen (TYLENOL PM) 25-500 MG TABS tablet Take 1 tablet by mouth at bedtime as needed (sleep).    . fluticasone (FLONASE) 50 MCG/ACT nasal spray Place 1 spray into both nostrils daily as needed for allergies or rhinitis.    Marland Kitchen isosorbide mononitrate (IMDUR) 30 MG 24 hr tablet TAKE 1 TABLET BY MOUTH DAILY. 90 tablet 3  . nitroGLYCERIN (NITROSTAT) 0.4 MG SL tablet Place 1 tablet (0.4 mg total) under the tongue every 5 (five) minutes as needed for chest pain. 25 tablet 11  . pantoprazole (PROTONIX) 40 MG tablet Take 1 tablet (40 mg total) by mouth daily. 90 tablet 1  . Potassium 99 MG TABS Take 300 mg by mouth daily.    Marland Kitchen ezetimibe (ZETIA) 10 MG tablet Take 1 tablet (10 mg total) by mouth daily. 90 tablet 4  . rosuvastatin (CRESTOR) 40 MG tablet Take 1 tablet (40 mg total) by mouth daily. 90 tablet 4  . ticagrelor (BRILINTA) 60 MG TABS tablet Take 1 tablet (60 mg total) by mouth 2 (two) times daily. 180 tablet 4   No current facility-administered medications for this visit.     Allergies:   Patient has no known allergies.   Social History:  The patient  reports that he quit smoking about 10 years ago. He has never used smokeless tobacco. He reports that he does not drink alcohol and does not use drugs.   Family History:   family history includes Cancer in his father; Coronary artery disease in his father and another family member; Hearing loss in his father and mother; Heart disease in his father and mother; Hypertension in his father and mother; Kidney disease in his father; Stroke in his father; Varicose Veins in his father; Vision loss in his father.   Review of Systems: Review of Systems  Constitutional: Negative.   HENT: Negative.   Respiratory: Negative.   Cardiovascular:  Negative.   Gastrointestinal: Negative.   Musculoskeletal: Negative.   Neurological: Negative.   Psychiatric/Behavioral: Negative.   All other systems reviewed and are negative.   PHYSICAL EXAM: VS:  BP 120/60 (BP Location: Left Arm, Patient Position: Sitting, Cuff Size: Normal)   Pulse 67   Ht 5\' 10"  (1.778 m)   Wt 192 lb (87.1 kg)   SpO2 98%   BMI 27.55 kg/m  , BMI Body mass index is 27.55 kg/m.  Constitutional:  oriented to person, place, and time. No distress.  HENT:  Head: Grossly normal Eyes:  no discharge. No scleral icterus.  Neck: No JVD, no carotid bruits  Cardiovascular: Regular rate and rhythm, no murmurs appreciated Pulmonary/Chest: Clear to auscultation bilaterally, no wheezes or rails Abdominal: Soft.  no distension.  no tenderness.  Musculoskeletal: Normal range of motion Neurological:  normal muscle tone. Coordination normal. No atrophy Skin: Skin warm and dry Psychiatric: normal affect, pleasant  Recent Labs: 06/11/2020: BUN 21; Creatinine, Ser 1.40; Hemoglobin 15.5; Platelets 193; Potassium 3.7; Sodium 138    Lipid Panel Lab  Results  Component Value Date   CHOL 159 05/17/2019   HDL 46 05/17/2019   LDLCALC 99 05/17/2019   TRIG 71 05/17/2019      Wt Readings from Last 3 Encounters:  07/02/20 192 lb (87.1 kg)  06/14/20 184 lb (83.5 kg)  06/11/20 189 lb (85.7 kg)     ASSESSMENT AND PLAN:  Atherosclerosis of native coronary artery without angina pectoris, unspecified whether native or transplanted heart -  STEMI with VT Stent stent to distal RCA and mid left circumflex -Recent cardiac catheterization, patent stents, 50% mid LAD disease Repeat lipid panel tomorrow, goal LDL 60 or less Need PCSK9 inhibitor, discussed on today's visit  Mixed hyperlipidemia Continue Crestor and Zetia Lipids as above, may need additional management  Human immunodeficiency virus (HIV) disease ([place]) Stable, on antivirals Managed by ID    Total encounter time  more than 25 minutes  Greater than 50% was spent in counseling and coordination of care with the patient    Orders Placed This Encounter  Procedures  . Lipid panel  . EKG 12-Lead     Signed, Esmond Plants, M.D., Ph.D. 07/02/2020  [place], [place]

## 2020-07-02 ENCOUNTER — Other Ambulatory Visit: Payer: Self-pay

## 2020-07-02 ENCOUNTER — Encounter: Payer: Self-pay | Admitting: Cardiovascular Disease

## 2020-07-02 ENCOUNTER — Ambulatory Visit: Payer: 59 | Admitting: Cardiovascular Disease

## 2020-07-02 ENCOUNTER — Other Ambulatory Visit: Payer: Self-pay | Admitting: Cardiovascular Disease

## 2020-07-02 VITALS — BP 120/60 | HR 67 | Ht 70.0 in | Wt 192.0 lb

## 2020-07-02 DIAGNOSIS — I25118 Atherosclerotic heart disease of native coronary artery with other forms of angina pectoris: Secondary | ICD-10-CM | POA: Diagnosis not present

## 2020-07-02 DIAGNOSIS — N183 Chronic kidney disease, stage 3 unspecified: Secondary | ICD-10-CM

## 2020-07-02 DIAGNOSIS — B20 Human immunodeficiency virus [HIV] disease: Secondary | ICD-10-CM

## 2020-07-02 DIAGNOSIS — E785 Hyperlipidemia, unspecified: Secondary | ICD-10-CM

## 2020-07-02 MED ORDER — EZETIMIBE 10 MG PO TABS: 10.0000 mg | ORAL_TABLET | Freq: Every day | ORAL | 4 refills | Status: DC

## 2020-07-02 MED ORDER — TICAGRELOR 60 MG PO TABS: 60.0000 mg | ORAL_TABLET | Freq: Two times a day (BID) | ORAL | 4 refills | Status: DC

## 2020-07-02 MED ORDER — ROSUVASTATIN CALCIUM 40 MG PO TABS: 40.0000 mg | ORAL_TABLET | Freq: Every day | ORAL | 4 refills | Status: DC

## 2020-07-02 NOTE — Patient Instructions (Addendum)
Restart  Walking program  Medication Instructions:  No changes  Lab work: Lipids (do not eat or drink 12 hrs before blood draw)  Walk into medical mall at the check in desk, they will direct you to lab registration, hours for labs are Monday-Friday 07:00am-5:30pm (no appointment necessary)  Testing/Procedures: No new testing needed   Follow-Up: At [facility], you and your health needs are our priority.  As part of our continuing mission to provide you with exceptional heart care, we have created designated Provider Care Teams.  These Care Teams include your primary Cardiologist (physician) and Advanced Practice Providers (APPs -  Physician Assistants and Nurse Practitioners) who all work together to provide you with the care you need, when you need it.  . You will need a follow up appointment in 6  months  . Providers on your designated Care Team:   . Murray Hodgkins, NP . Christell Faith, PA-C . Marrianne Mood, PA-C  Any Other Special Instructions Will Be Listed Below (If Applicable).  COVID-19 Vaccine Information can be found at: ShippingScam.co.uk For questions related to vaccine distribution or appointments, please email [email or web address] or call [phone].

## 2020-07-03 ENCOUNTER — Other Ambulatory Visit: Payer: Self-pay

## 2020-07-03 ENCOUNTER — Other Ambulatory Visit
Admission: RE | Admit: 2020-07-03 | Discharge: 2020-07-03 | Disposition: A | Discharge status: Home / Self Care | Payer: 59 | Attending: Cardiovascular Disease | Admitting: Cardiovascular Disease

## 2020-07-03 DIAGNOSIS — E785 Hyperlipidemia, unspecified: Secondary | ICD-10-CM | POA: Diagnosis not present

## 2020-07-03 LAB — LIPID PANEL
Cholesterol: 86 mg/dL (ref 0–200)
HDL: 49 mg/dL (ref >40)
LDL Cholesterol: 28 mg/dL (ref 0–99)
Total CHOL/HDL Ratio: 1.8 RATIO
Triglycerides: 43 mg/dL (ref <150)
VLDL: 9 mg/dL (ref 0–40)

## 2020-07-03 MED FILL — BIKTARVY 50-200-25 MG TABS: 50-200-25 | 30 days supply | Qty: 30 | Fill #1

## 2020-07-12 ENCOUNTER — Other Ambulatory Visit: Payer: Self-pay

## 2020-07-12 MED FILL — Ezetimibe Tab 10 MG: ORAL | 90 days supply | Qty: 90 | Fill #0 | Status: AC

## 2020-07-12 MED FILL — Isosorbide Mononitrate Tab ER 24HR 30 MG: ORAL | 90 days supply | Qty: 90 | Fill #0 | Status: AC

## 2020-07-12 MED FILL — Rosuvastatin Calcium Tab 40 MG: ORAL | 90 days supply | Qty: 90 | Fill #0 | Status: AC

## 2020-07-13 ENCOUNTER — Other Ambulatory Visit: Payer: Self-pay

## 2020-07-19 ENCOUNTER — Other Ambulatory Visit: Payer: Self-pay

## 2020-07-31 ENCOUNTER — Other Ambulatory Visit (HOSPITAL_COMMUNITY): Payer: Self-pay

## 2020-07-31 ENCOUNTER — Other Ambulatory Visit: Payer: Self-pay | Admitting: Infectious Diseases

## 2020-07-31 MED FILL — Bictegravir-Emtricitabine-Tenofovir AF Tab 50-200-25 MG: ORAL | 30 days supply | Qty: 30 | Fill #0 | Status: AC

## 2020-08-01 ENCOUNTER — Other Ambulatory Visit (HOSPITAL_COMMUNITY): Payer: Self-pay

## 2020-08-28 ENCOUNTER — Other Ambulatory Visit (HOSPITAL_COMMUNITY): Payer: Self-pay

## 2020-08-28 ENCOUNTER — Other Ambulatory Visit: Payer: Self-pay | Admitting: Infectious Diseases

## 2020-08-28 ENCOUNTER — Other Ambulatory Visit: Payer: Self-pay | Admitting: Pharmacist

## 2020-08-28 DIAGNOSIS — B20 Human immunodeficiency virus [HIV] disease: Secondary | ICD-10-CM

## 2020-08-29 ENCOUNTER — Other Ambulatory Visit (HOSPITAL_COMMUNITY): Payer: Self-pay

## 2020-08-29 ENCOUNTER — Other Ambulatory Visit: Payer: Self-pay | Admitting: Infectious Diseases

## 2020-08-29 DIAGNOSIS — B20 Human immunodeficiency virus [HIV] disease: Secondary | ICD-10-CM

## 2020-08-30 ENCOUNTER — Other Ambulatory Visit (HOSPITAL_COMMUNITY): Payer: Self-pay

## 2020-08-30 ENCOUNTER — Other Ambulatory Visit: Payer: Self-pay | Admitting: Infectious Diseases

## 2020-08-30 DIAGNOSIS — B20 Human immunodeficiency virus [HIV] disease: Secondary | ICD-10-CM

## 2020-08-31 ENCOUNTER — Other Ambulatory Visit: Payer: Self-pay

## 2020-08-31 ENCOUNTER — Other Ambulatory Visit (HOSPITAL_COMMUNITY): Payer: Self-pay

## 2020-09-04 ENCOUNTER — Other Ambulatory Visit (HOSPITAL_COMMUNITY): Payer: Self-pay

## 2020-09-04 MED ORDER — BIKTARVY 50-200-25 MG PO TABS
1.0000 | ORAL_TABLET | Freq: Every day | ORAL | 11 refills | Status: DC
  Filled 2020-09-04: qty 30, 30d supply, fill #0

## 2020-09-11 ENCOUNTER — Other Ambulatory Visit (HOSPITAL_COMMUNITY): Payer: Self-pay

## 2020-09-11 ENCOUNTER — Other Ambulatory Visit: Payer: Self-pay | Admitting: Pharmacist

## 2020-09-11 DIAGNOSIS — B20 Human immunodeficiency virus [HIV] disease: Secondary | ICD-10-CM

## 2020-09-11 MED ORDER — BIKTARVY 50-200-25 MG PO TABS
1.0000 | ORAL_TABLET | Freq: Every day | ORAL | 11 refills | Status: DC
  Filled 2020-09-11 – 2020-09-26 (×2): qty 30, 30d supply, fill #0
  Filled 2020-10-24: qty 30, 30d supply, fill #1
  Filled 2020-11-19: qty 30, 30d supply, fill #2
  Filled 2020-12-24: qty 30, 30d supply, fill #3
  Filled 2021-01-23 (×2): qty 30, 30d supply, fill #4
  Filled 2021-02-21: qty 30, 30d supply, fill #5
  Filled 2021-03-25: qty 30, 30d supply, fill #6
  Filled 2021-04-25: qty 30, 30d supply, fill #7
  Filled 2021-05-24: qty 30, 30d supply, fill #8
  Filled 2021-06-26: qty 30, 30d supply, fill #9

## 2020-09-12 IMAGING — US US RENAL
1 series · 14 of 25 positions shown · non-contrast
Comparison: None.

CLINICAL DATA: Stage III chronic renal disease

EXAM:
RENAL / URINARY TRACT ULTRASOUND COMPLETE

[Series 1: us renal · 0.23mm/px · 14 of 48 slices shown]
[im 1/48]
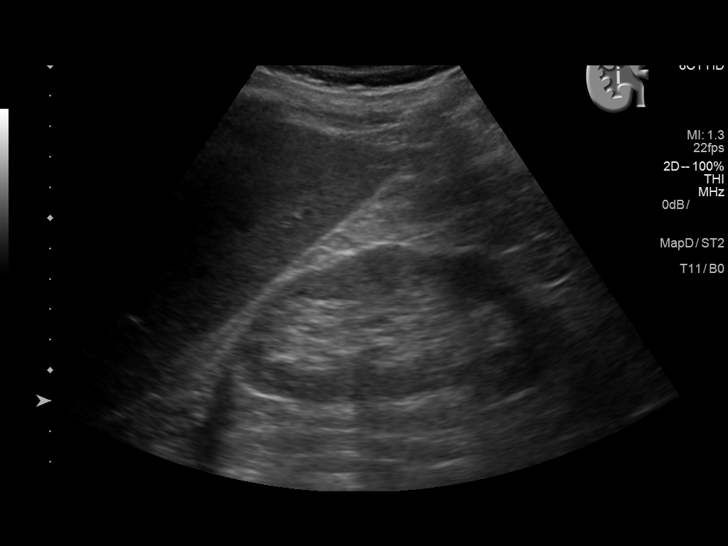
[im 4/48]
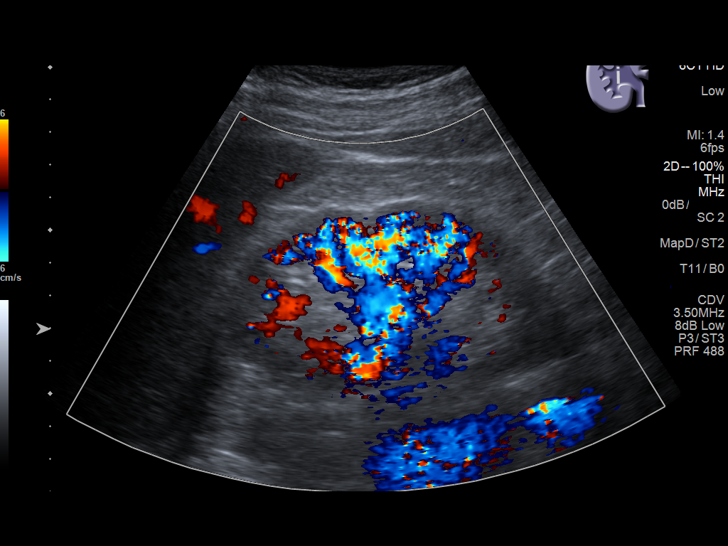
[im 8/48]
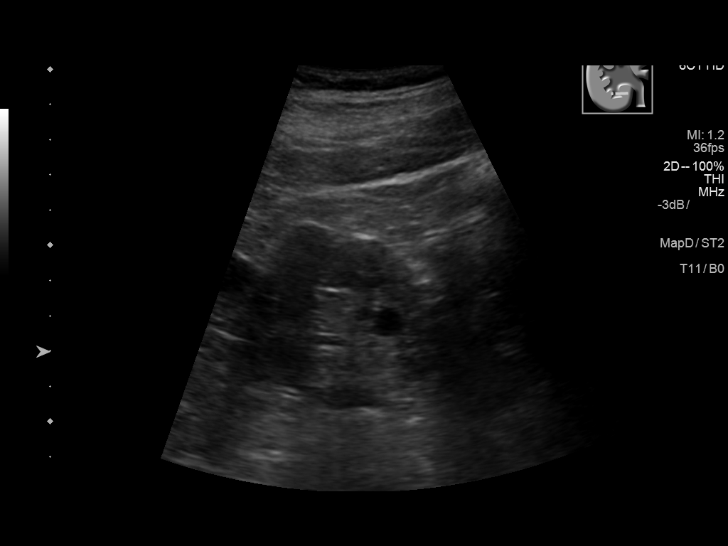
[im 12/48]
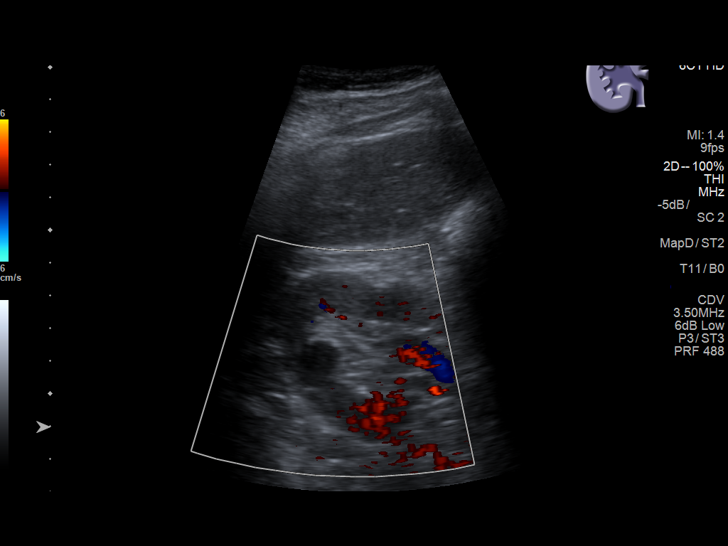
[im 16/48]
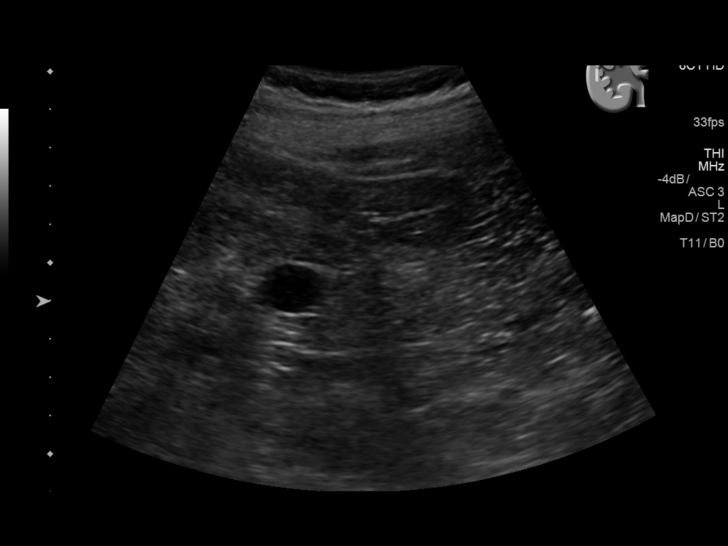
[im 18/48]
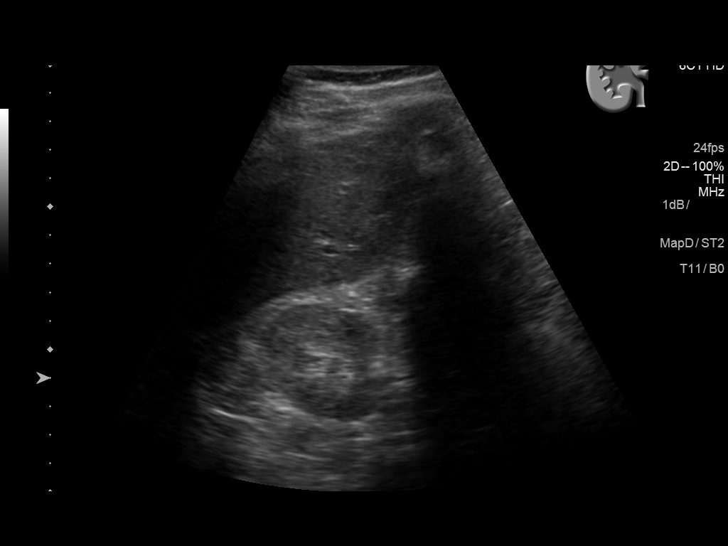
[im 22/48]
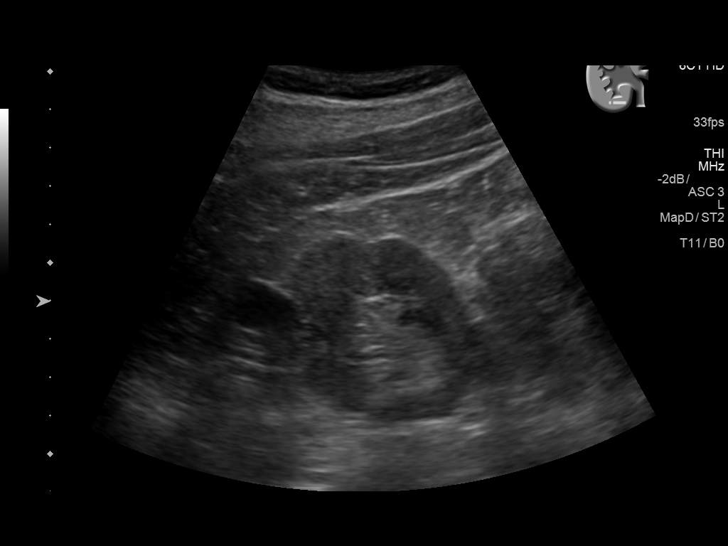
[im 26/48]
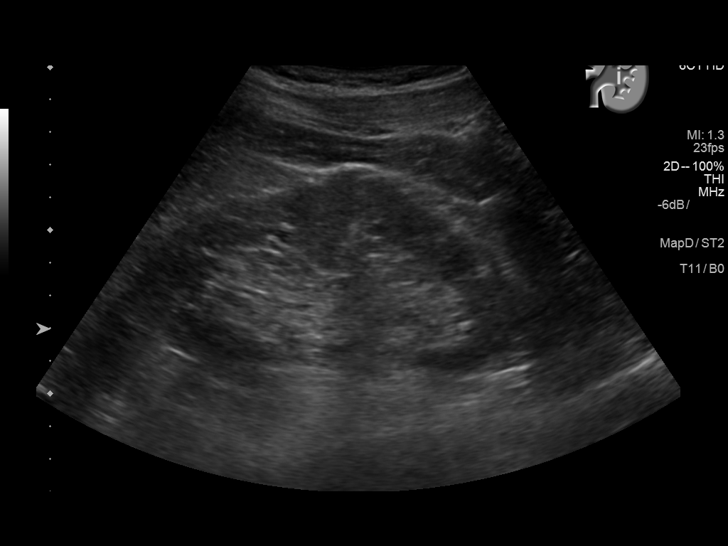
[im 30/48]
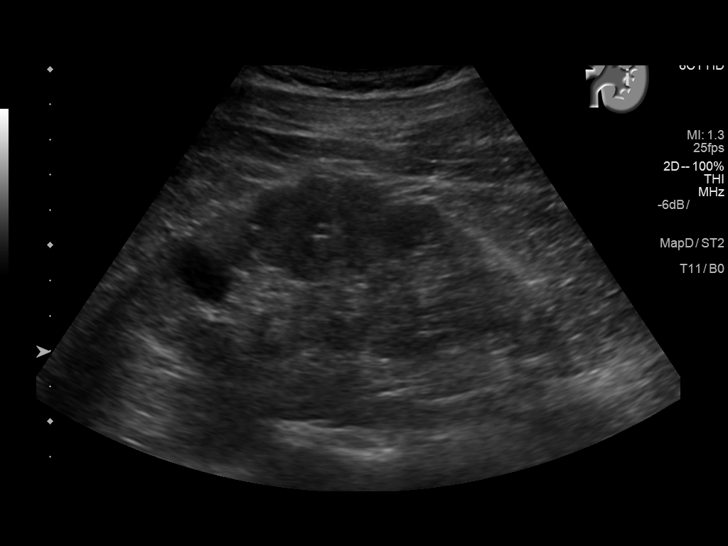
[im 32/48]
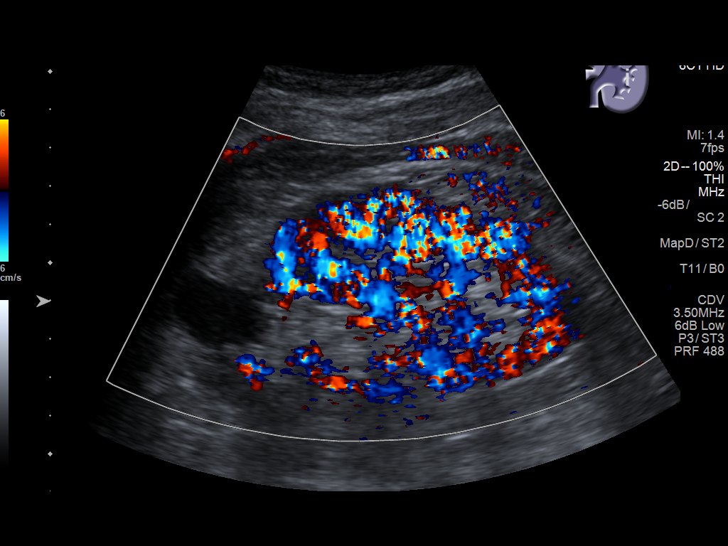
[im 36/48]
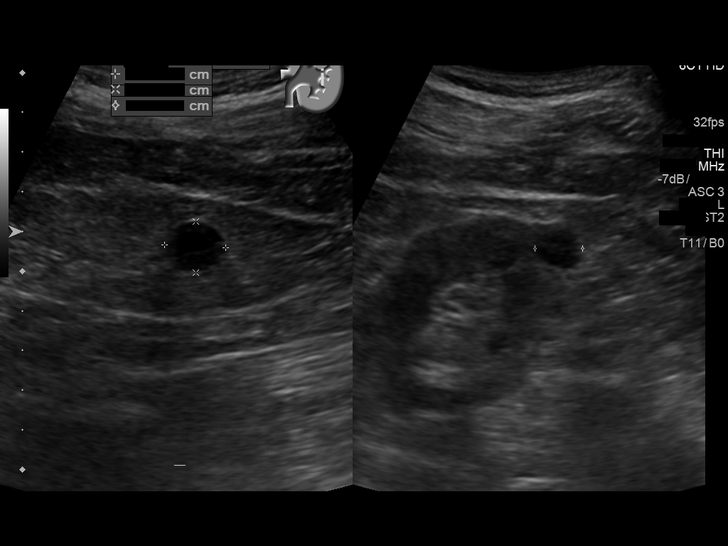
[im 40/48]
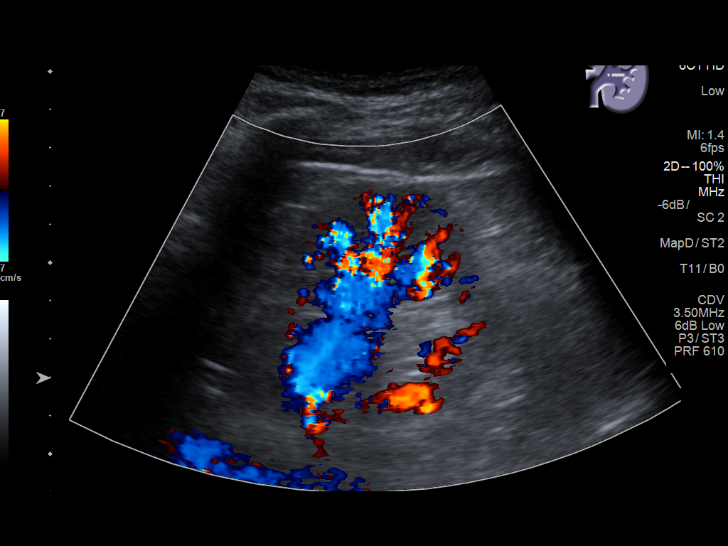
[im 44/48]
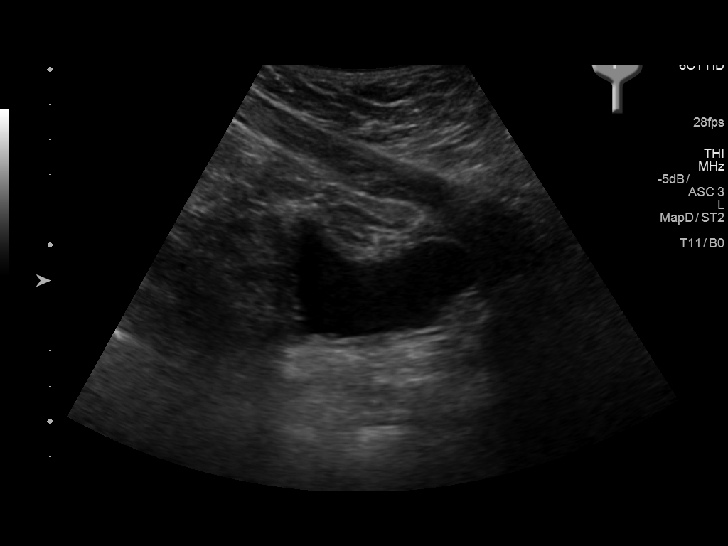
[im 48/48]
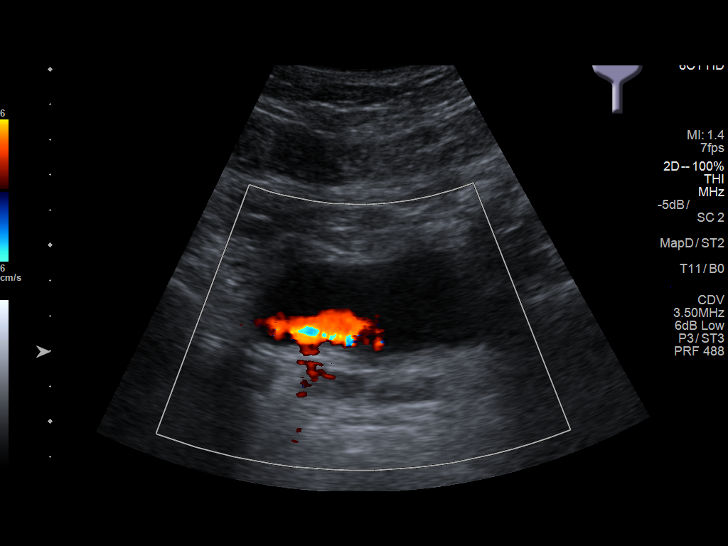

[14 of 25 positions shown; findings below may reference images not displayed]

FINDINGS: Right Kidney:

Length: 11.1 cm. Echogenicity and renal cortical thickness are
within normal limits. No perinephric fluid or hydronephrosis
visualized. There is a cyst arising from the upper pole of the right
kidney measuring 1.5 x 1.4 x 1.4 cm. There is a cyst arising in the
lower pole right kidney measuring 2.0 x 1.8 x 1.3 cm. There is a
cyst in the lower pole right kidney measuring 1.0 x 0.8 x 0.8 cm. No
sonographically demonstrable calculus or ureterectasis.

Left Kidney:

Length: 11.5 cm. Echogenicity and renal cortical thickness are
within normal limits. No perinephric fluid or hydronephrosis
visualized. There is a cyst in the upper pole left kidney measuring
2.7 x 2.1 x 1.9 cm. There is a cyst in the lower pole left kidney
measuring 1.5 x 1.3 x 1.2 cm. No sonographically demonstrable
calculus or ureterectasis.

Bladder:

Appears normal for degree of bladder distention.
IMPRESSION: Renal cysts bilaterally.  Study otherwise unremarkable.

## 2020-09-26 ENCOUNTER — Other Ambulatory Visit (HOSPITAL_COMMUNITY): Payer: Self-pay

## 2020-10-02 ENCOUNTER — Other Ambulatory Visit (HOSPITAL_COMMUNITY): Payer: Self-pay

## 2020-10-24 ENCOUNTER — Other Ambulatory Visit (HOSPITAL_COMMUNITY): Payer: Self-pay

## 2020-10-31 ENCOUNTER — Other Ambulatory Visit (HOSPITAL_COMMUNITY): Payer: Self-pay

## 2020-11-04 ENCOUNTER — Other Ambulatory Visit: Payer: Self-pay

## 2020-11-04 MED FILL — Isosorbide Mononitrate Tab ER 24HR 30 MG: ORAL | 90 days supply | Qty: 90 | Fill #1 | Status: AC

## 2020-11-04 MED FILL — Ezetimibe Tab 10 MG: ORAL | 90 days supply | Qty: 90 | Fill #1 | Status: AC

## 2020-11-04 MED FILL — Rosuvastatin Calcium Tab 40 MG: ORAL | 90 days supply | Qty: 90 | Fill #1 | Status: AC

## 2020-11-04 MED FILL — Ticagrelor Tab 60 MG: ORAL | 90 days supply | Qty: 180 | Fill #0 | Status: AC

## 2020-11-05 ENCOUNTER — Other Ambulatory Visit: Payer: Self-pay

## 2020-11-19 ENCOUNTER — Other Ambulatory Visit (HOSPITAL_COMMUNITY): Payer: Self-pay

## 2020-11-26 DIAGNOSIS — I2510 Atherosclerotic heart disease of native coronary artery without angina pectoris: Secondary | ICD-10-CM | POA: Diagnosis not present

## 2020-11-26 DIAGNOSIS — R5383 Other fatigue: Secondary | ICD-10-CM | POA: Diagnosis not present

## 2020-11-28 ENCOUNTER — Other Ambulatory Visit (HOSPITAL_COMMUNITY): Payer: Self-pay

## 2020-11-29 IMAGING — CR DG CHEST 2V
2 series · 2 of 2 positions shown · non-contrast
Comparison: None

CLINICAL DATA: Mid chest pain for 1 week, nausea beginning today,
history coronary artery disease post MI

EXAM:
CHEST - 2 VIEW

[chest pa]
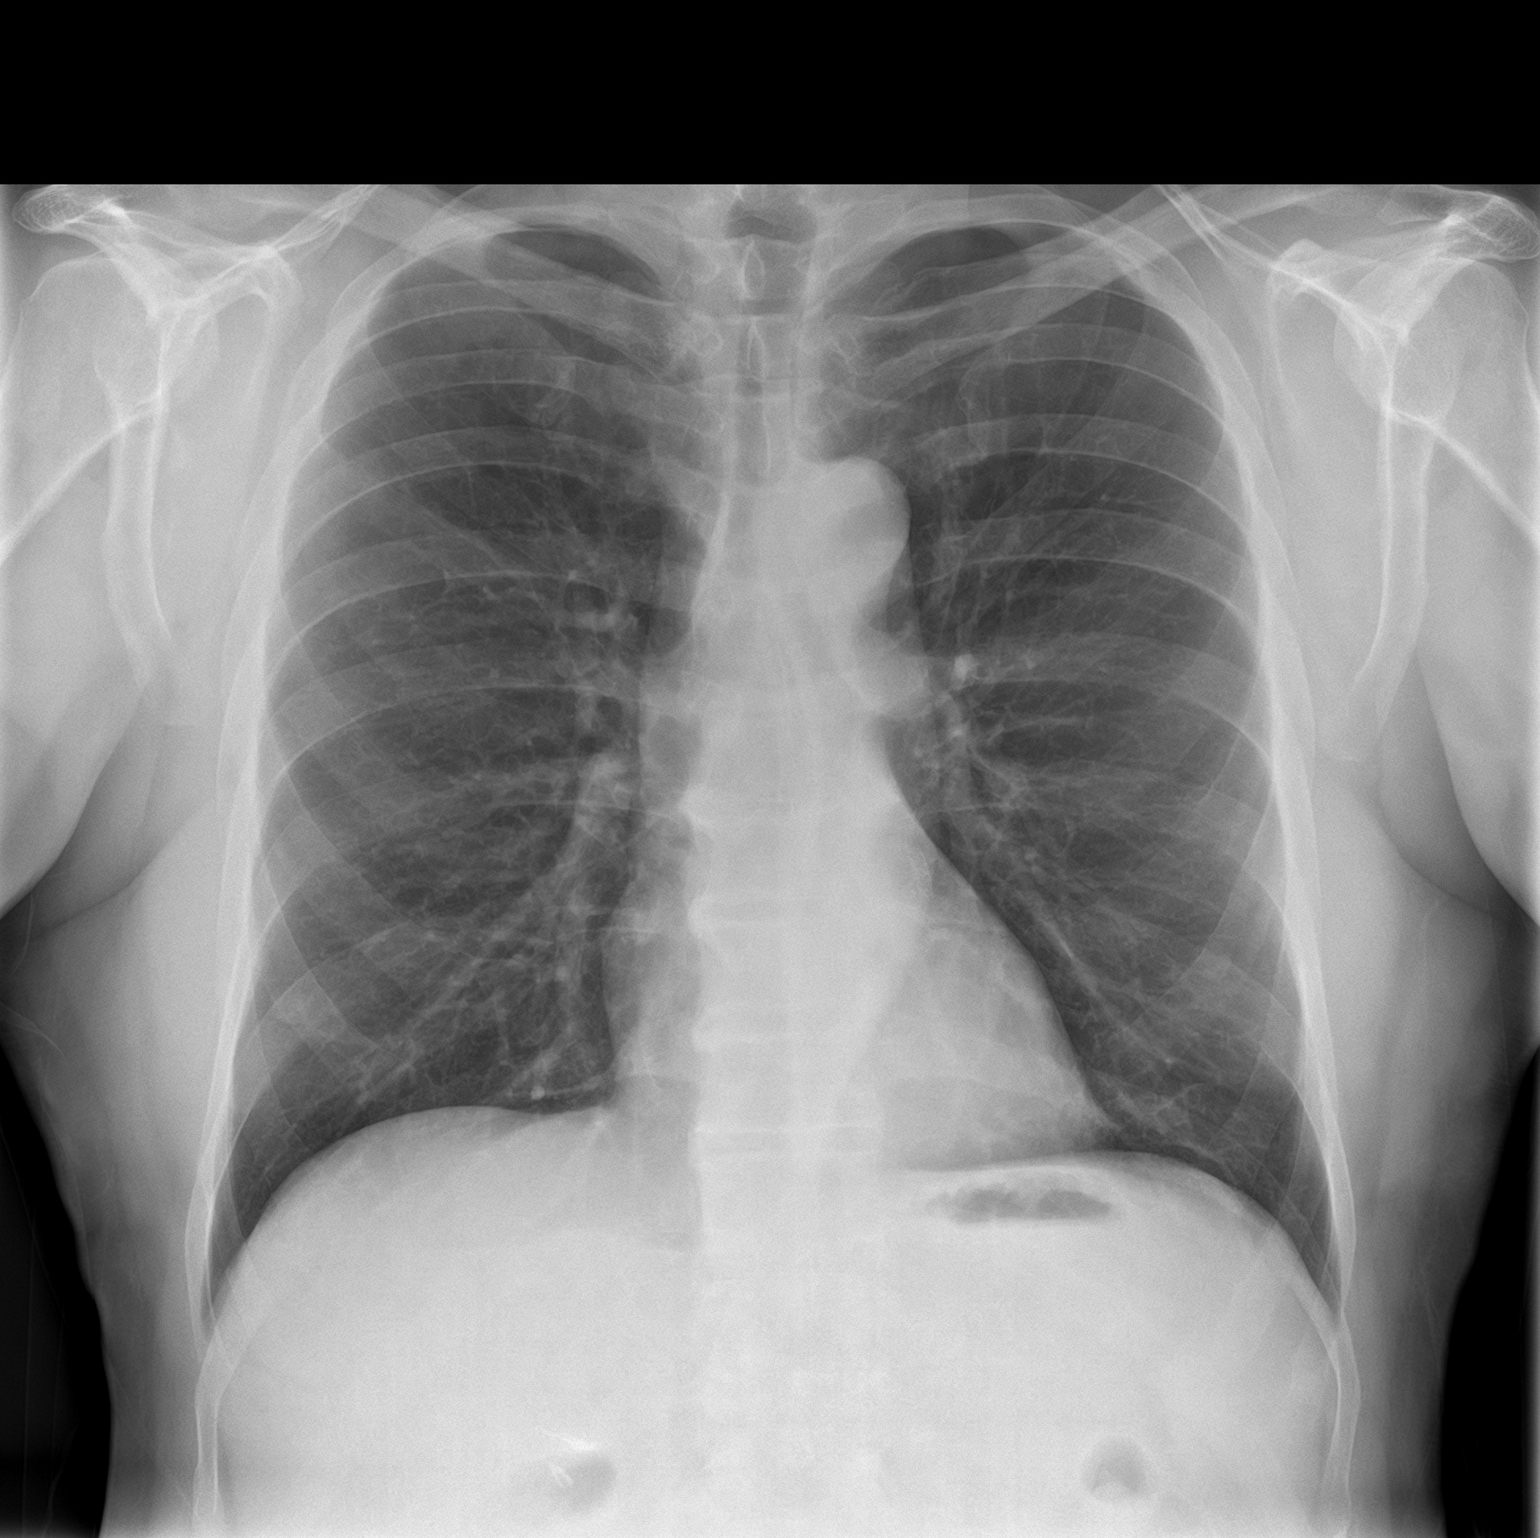

[chest lat]
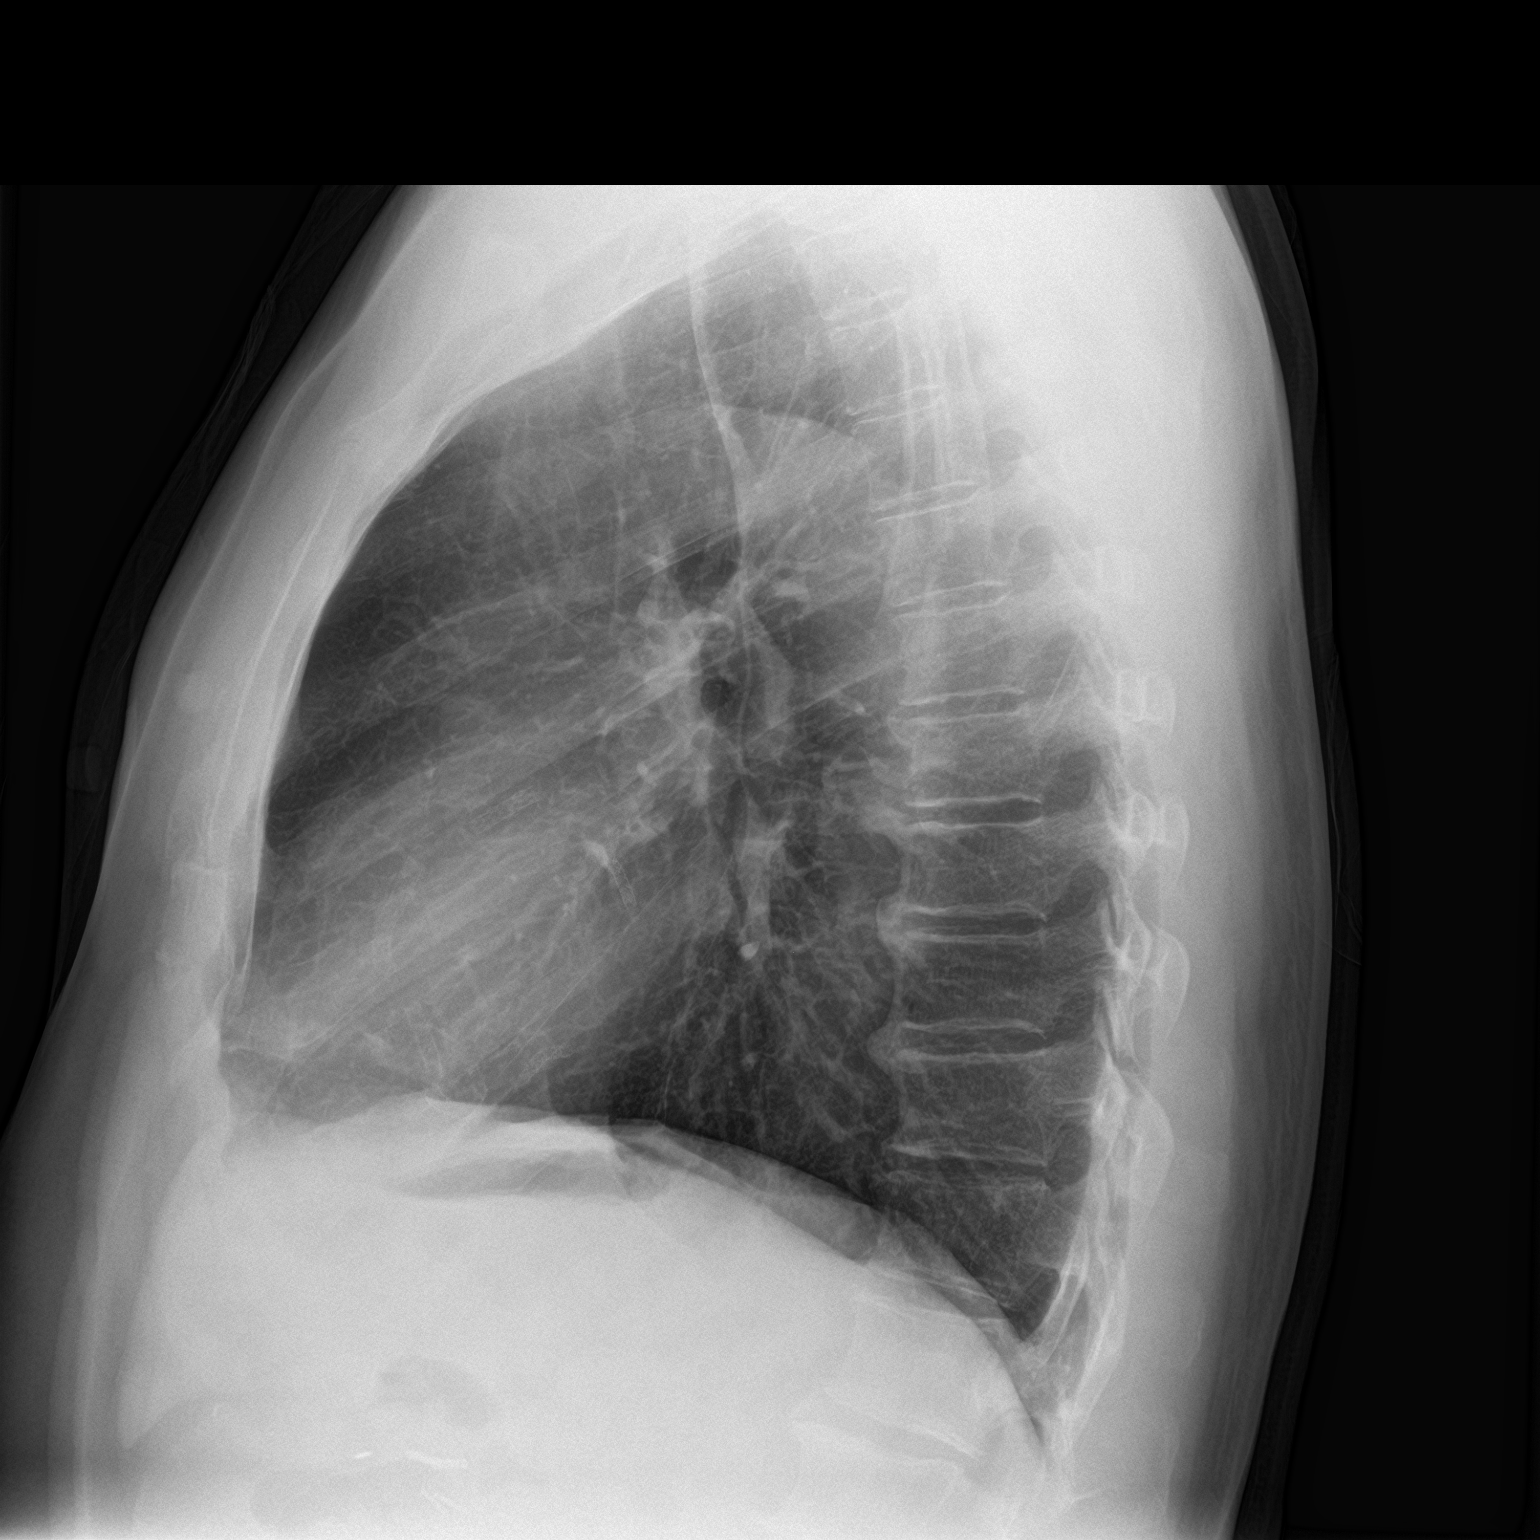

[2 of 2 positions shown; findings below may reference images not displayed]

FINDINGS: Normal heart size and pulmonary vascularity.

Tortuous aorta.

Coronary arterial stents noted.

Lungs clear.

BILATERAL nipple shadows, confirmed on lateral view.

No pulmonary infiltrate, pleural effusion or pneumothorax.

Minor endplate spur formation thoracic spine.
IMPRESSION: No acute abnormalities.

## 2020-12-03 DIAGNOSIS — I2510 Atherosclerotic heart disease of native coronary artery without angina pectoris: Secondary | ICD-10-CM | POA: Diagnosis not present

## 2020-12-03 DIAGNOSIS — Z21 Asymptomatic human immunodeficiency virus [HIV] infection status: Secondary | ICD-10-CM | POA: Diagnosis not present

## 2020-12-03 DIAGNOSIS — E782 Mixed hyperlipidemia: Secondary | ICD-10-CM | POA: Diagnosis not present

## 2020-12-24 ENCOUNTER — Other Ambulatory Visit (HOSPITAL_COMMUNITY): Payer: Self-pay

## 2020-12-27 ENCOUNTER — Other Ambulatory Visit (HOSPITAL_COMMUNITY): Payer: Self-pay

## 2021-01-23 ENCOUNTER — Other Ambulatory Visit: Payer: Self-pay

## 2021-01-23 ENCOUNTER — Other Ambulatory Visit (HOSPITAL_COMMUNITY): Payer: Self-pay

## 2021-01-23 DIAGNOSIS — I25118 Atherosclerotic heart disease of native coronary artery with other forms of angina pectoris: Secondary | ICD-10-CM

## 2021-01-23 MED ORDER — NITROGLYCERIN 0.4 MG SL SUBL
0.4000 mg | SUBLINGUAL_TABLET | SUBLINGUAL | 0 refills | Status: AC | PRN
  Filled 2021-01-23: qty 25, 5d supply, fill #0

## 2021-01-23 MED FILL — Rosuvastatin Calcium Tab 40 MG: ORAL | 90 days supply | Qty: 90 | Fill #2 | Status: AC

## 2021-01-23 MED FILL — Ezetimibe Tab 10 MG: ORAL | 90 days supply | Qty: 90 | Fill #2 | Status: AC

## 2021-01-23 MED FILL — Ticagrelor Tab 60 MG: ORAL | 90 days supply | Qty: 180 | Fill #1 | Status: AC

## 2021-01-23 MED FILL — Isosorbide Mononitrate Tab ER 24HR 30 MG: ORAL | 90 days supply | Qty: 90 | Fill #2 | Status: AC

## 2021-01-23 NOTE — Telephone Encounter (Signed)
Please schedule overdue 6 month F/U appointment. Thank you! °

## 2021-01-23 NOTE — Telephone Encounter (Signed)
Scheduled

## 2021-01-29 ENCOUNTER — Other Ambulatory Visit (HOSPITAL_COMMUNITY): Payer: Self-pay

## 2021-02-05 ENCOUNTER — Other Ambulatory Visit: Payer: Self-pay

## 2021-02-05 ENCOUNTER — Ambulatory Visit: Payer: 59 | Admitting: Cardiovascular Disease

## 2021-02-05 ENCOUNTER — Encounter: Payer: Self-pay | Admitting: Cardiovascular Disease

## 2021-02-05 VITALS — BP 110/80 | HR 66 | Ht 70.0 in | Wt 196.0 lb

## 2021-02-05 DIAGNOSIS — E785 Hyperlipidemia, unspecified: Secondary | ICD-10-CM

## 2021-02-05 DIAGNOSIS — K219 Gastro-esophageal reflux disease without esophagitis: Secondary | ICD-10-CM

## 2021-02-05 DIAGNOSIS — R079 Chest pain, unspecified: Secondary | ICD-10-CM | POA: Diagnosis not present

## 2021-02-05 DIAGNOSIS — B20 Human immunodeficiency virus [HIV] disease: Secondary | ICD-10-CM | POA: Diagnosis not present

## 2021-02-05 DIAGNOSIS — I25118 Atherosclerotic heart disease of native coronary artery with other forms of angina pectoris: Secondary | ICD-10-CM

## 2021-02-05 DIAGNOSIS — N183 Chronic kidney disease, stage 3 unspecified: Secondary | ICD-10-CM

## 2021-02-05 MED ORDER — RANOLAZINE ER 1000 MG PO TB12
1000.0000 mg | ORAL_TABLET | Freq: Two times a day (BID) | ORAL | 6 refills | Status: DC
  Filled 2021-02-05 (×2): qty 60, 30d supply, fill #0
  Filled 2021-05-08: qty 60, 30d supply, fill #1

## 2021-02-05 NOTE — Progress Notes (Signed)
Cardiology Office Note  Date:  02/05/2021   ID:  Daniel Black, DOB 1960/06/25, MRN [id number]  PCP:  Juline Patch, MD   Chief Complaint  Patient presents with   6 month follow up     Patient c/o shortness of breath and chest pain with exertion. Medications reviewed by the patient verbally.     HPI:  Mr. Daniel Black is a very pleasant 60 year old gentleman with  remote smoking history,  coronary artery disease,  MI in 2004 with PCI to the LAD and mid RCA at that time,  PCI in 2010 of the mid RCA lesion,  hyperlipidemia, HIV who is on antivirals  who presents for routine follow up of his coronary artery disease.  Reports that he has started Working out again 2x a week Has appreciated SOB with heavy exertion Most notable with hills and steps Concerned there might be progression of his underlying coronary disease  For similar symptoms underwent cardiac catheterization March 2022  Continues to work 8 hours a day 5 days a week  Labs reviewed CR 1.5 Total chol 86  On Crestor Zetia  EKG personally reviewed by myself on todays visit Normal sinus rhythm rate 66 bpm old inferior MI  Catheterization March 2022 Patent stents LAD, circumflex, RCA Mid to distal LAD with moderate disease, tapers significantly from proximal to mid region, small caliber vessel mid to distal Normal ejection fraction, no significant aortic valve stenosis No significant change compared to prior catheterization 2019, unable to exclude slight progression of mid LAD disease  STEMI 11/2017 Stent to the distal RCA and posterior AV groove segment Status post staged PCI to the LCx  11/19/2017., second stent placed for suspected edge dissection  Cardiac catheterization report from April 2010 details patent LAD stent, 30% distal LAD, 25% proximal left circumflex, 20% mid circumflex, 95% mid RCA, 40% distal RCA, 30% right PDA. Xience 3.5 x 28 mm DES stent placed to the mid RCA.     PMH:   has a past medical  history of Asymptomatic HIV infection ([place]), Bell's palsy (2004), CAD (coronary artery disease), DDD (degenerative disc disease) (10/02), Erectile dysfunction, HLD (hyperlipidemia), Myocardial infarction ([place]) (2004), Nephrolithiasis, and Skin cancer, basal cell.  PSH:    Past Surgical History:  Procedure Laterality Date   AMPUTATION  1985   L fingers reattachement    back fusion     lumbar   CARDIAC CATHETERIZATION     x2. 2004, 2010. 3 stents(total)   CHOLECYSTECTOMY  3/03   COLONOSCOPY WITH PROPOFOL N/A 06/13/2016   Procedure: COLONOSCOPY WITH PROPOFOL;  Surgeon: Lucilla Lame, MD;  Location: [facility];  Service: Endoscopy;  Laterality: N/A;   CORONARY/GRAFT ACUTE MI REVASCULARIZATION N/A 11/16/2017   Procedure: Coronary/Graft Acute MI Revascularization;  Surgeon: [name], MD;  Location: [facility];  Service: Cardiovascular;  Laterality: N/A;   LEFT HEART CATH AND CORONARY ANGIOGRAPHY N/A 11/16/2017   Procedure: LEFT HEART CATH AND CORONARY ANGIOGRAPHY;  Surgeon: [name], MD;  Location: [facility];  Service: Cardiovascular;  Laterality: N/A;   LEFT HEART CATH AND CORONARY ANGIOGRAPHY N/A 11/19/2017   Procedure: LEFT HEART CATH AND CORONARY ANGIOGRAPHY;  Surgeon: [name], MD;  Location: [facility];  Service: Cardiovascular;  Laterality: N/A;   LEFT HEART CATH AND CORONARY ANGIOGRAPHY Left 06/14/2020   Procedure: LEFT HEART CATH AND CORONARY ANGIOGRAPHY;  Surgeon: [name], MD;  Location: [facility];  Service: Cardiovascular;  Laterality: Left;   POLYPECTOMY  06/13/2016   Procedure: POLYPECTOMY;  Surgeon: Lucilla Lame, MD;  Location: [facility];  Service: Endoscopy;;   WRIST SURGERY  10/2011    Current Outpatient Medications  Medication Sig Dispense Refill   aspirin 81 MG chewable tablet Chew 1 tablet (81 mg total) by mouth daily.     bictegravir-emtricitabine-tenofovir AF (BIKTARVY) 50-200-25 MG TABS  tablet Take 1 tablet by mouth once daily 30 tablet 11   ezetimibe (ZETIA) 10 MG tablet TAKE 1 TABLET (10 MG TOTAL) BY MOUTH DAILY. 90 tablet 4   fluticasone (FLONASE) 50 MCG/ACT nasal spray Place 1 spray into both nostrils daily as needed for allergies or rhinitis.     isosorbide mononitrate (IMDUR) 30 MG 24 hr tablet TAKE 1 TABLET BY MOUTH DAILY. 90 tablet 3   nitroGLYCERIN (NITROSTAT) 0.4 MG SL tablet Place 1 tablet (0.4 mg total) under the tongue every 5 (five) minutes as needed for chest pain. 25 tablet 0   rosuvastatin (CRESTOR) 40 MG tablet TAKE 1 TABLET BY MOUTH DAILY. 90 tablet 4   ticagrelor (BRILINTA) 60 MG TABS tablet TAKE 1 TABLET BY MOUTH TWICE DAILY 180 tablet 4   acetaminophen (TYLENOL) 325 MG tablet Take 650 mg by mouth every 6 (six) hours as needed for moderate pain or headache. (Patient not taking: Reported on 02/05/2021)     diphenhydramine-acetaminophen (TYLENOL PM) 25-500 MG TABS tablet Take 1 tablet by mouth at bedtime as needed (sleep). (Patient not taking: Reported on 02/05/2021)     pantoprazole (PROTONIX) 40 MG tablet TAKE 1 TABLET BY MOUTH DAILY. (Patient not taking: Reported on 02/05/2021) 90 tablet 1   Potassium 99 MG TABS Take 300 mg by mouth daily. (Patient not taking: Reported on 02/05/2021)     No current facility-administered medications for this visit.     Allergies:   Patient has no known allergies.   Social History:  The patient  reports that he quit smoking about 10 years ago. His smoking use included cigarettes. He has never used smokeless tobacco. He reports that he does not drink alcohol and does not use drugs.   Family History:   family history includes Cancer in his father; Coronary artery disease in his father and another family member; Hearing loss in his father and mother; Heart disease in his father and mother; Hypertension in his father and mother; Kidney disease in his father; Stroke in his father; Varicose Veins in his father; Vision loss in his  father.   Review of Systems: Review of Systems  Constitutional: Negative.   HENT: Negative.    Respiratory: Negative.    Cardiovascular: Negative.   Gastrointestinal: Negative.   Musculoskeletal: Negative.   Neurological: Negative.   Psychiatric/Behavioral: Negative.    All other systems reviewed and are negative.  PHYSICAL EXAM: VS:  BP 110/80 (BP Location: Left Arm, Patient Position: Sitting, Cuff Size: Normal)   Pulse 66   Ht 5\' 10"  (1.778 m)   Wt 196 lb (88.9 kg)   SpO2 98%   BMI 28.12 kg/m  , BMI Body mass index is 28.12 kg/m.  Constitutional:  oriented to person, place, and time. No distress.  HENT:  Head: Grossly normal Eyes:  no discharge. No scleral icterus.  Neck: No JVD, no carotid bruits  Cardiovascular: Regular rate and rhythm, no murmurs appreciated Pulmonary/Chest: Clear to auscultation bilaterally, no wheezes or rails Abdominal: Soft.  no distension.  no tenderness.  Musculoskeletal: Normal range of motion Neurological:  normal muscle tone.  Coordination normal. No atrophy Skin: Skin warm and dry Psychiatric: normal affect, pleasant   Recent Labs: 06/11/2020: BUN 21; Creatinine, Ser 1.40; Hemoglobin 15.5; Platelets 193; Potassium 3.7; Sodium 138    Lipid Panel Lab Results  Component Value Date   CHOL 86 07/03/2020   HDL 49 07/03/2020   LDLCALC 28 07/03/2020   TRIG 43 07/03/2020      Wt Readings from Last 3 Encounters:  02/05/21 196 lb (88.9 kg)  07/02/20 192 lb (87.1 kg)  06/14/20 184 lb (83.5 kg)     ASSESSMENT AND PLAN:  Atherosclerosis of native coronary artery without angina pectoris, unspecified whether native or transplanted heart -  cardiac catheterization March 2022, patent stents, 50% mid LAD disease Cholesterol at goal Shortness of breath on heavy exertion discussed in detail Unable to exclude conditioning versus chronic angina Exercising twice a week We did discuss pharmacologic Myoview, does not feel a show anything For now  recommended he start Ranexa 500 twice daily with titration up to 1000 twice daily  Mixed hyperlipidemia Continue Crestor and Zetia Cholesterol at goal  Human immunodeficiency virus (HIV) disease ([place]) Stable, on antivirals Managed by ID    Total encounter time more than 25 minutes  Greater than 50% was spent in counseling and coordination of care with the patient    Orders Placed This Encounter  Procedures   EKG 12-Lead     Signed, Esmond Plants, M.D., Ph.D. 02/05/2021  [place], [place]

## 2021-02-05 NOTE — Patient Instructions (Addendum)
Medication Instructions:  Please START Ranexa 500 mg  twice a day for 1-2 weeks  then up to a whole (1,000 mg) pill twice a day If no benefit after one month, ok to stop  If you need a refill on your cardiac medications before your next appointment, please call your pharmacy.   Lab work: No new labs needed  Testing/Procedures: No new testing needed  Follow-Up: At [facility], you and your health needs are our priority.  As part of our continuing mission to provide you with exceptional heart care, we have created designated Provider Care Teams.  These Care Teams include your primary Cardiologist (physician) and Advanced Practice Providers (APPs -  Physician Assistants and Nurse Practitioners) who all work together to provide you with the care you need, when you need it.  You will need a follow up appointment in 6 months  Providers on your designated Care Team:   Murray Hodgkins, NP Christell Faith, PA-C Cadence Kathlen Mody, [place]   COVID-19 Vaccine Information can be found at: ShippingScam.co.uk For questions related to vaccine distribution or appointments, please email [email or web address] or call [phone].

## 2021-02-06 ENCOUNTER — Other Ambulatory Visit: Payer: Self-pay

## 2021-02-07 ENCOUNTER — Other Ambulatory Visit: Payer: Self-pay

## 2021-02-21 ENCOUNTER — Other Ambulatory Visit (HOSPITAL_COMMUNITY): Payer: Self-pay

## 2021-02-26 ENCOUNTER — Other Ambulatory Visit (HOSPITAL_COMMUNITY): Payer: Self-pay

## 2021-03-25 ENCOUNTER — Other Ambulatory Visit (HOSPITAL_COMMUNITY): Payer: Self-pay

## 2021-04-02 ENCOUNTER — Other Ambulatory Visit (HOSPITAL_COMMUNITY): Payer: Self-pay

## 2021-04-08 ENCOUNTER — Other Ambulatory Visit: Payer: Self-pay

## 2021-04-08 ENCOUNTER — Other Ambulatory Visit: Payer: Self-pay | Admitting: Family

## 2021-04-08 MED FILL — Ezetimibe Tab 10 MG: ORAL | 90 days supply | Qty: 90 | Fill #3 | Status: AC

## 2021-04-08 MED FILL — Ticagrelor Tab 60 MG: ORAL | 90 days supply | Qty: 180 | Fill #2 | Status: AC

## 2021-04-09 ENCOUNTER — Other Ambulatory Visit: Payer: Self-pay

## 2021-04-09 MED ORDER — ISOSORBIDE MONONITRATE ER 30 MG PO TB24
ORAL_TABLET | Freq: Every day | ORAL | 3 refills | Status: DC
  Filled 2021-04-09: qty 90, 90d supply, fill #0
  Filled 2021-07-28: qty 90, 90d supply, fill #1
  Filled 2021-10-27: qty 90, 90d supply, fill #2
  Filled 2022-01-28: qty 90, 90d supply, fill #3

## 2021-04-11 ENCOUNTER — Other Ambulatory Visit: Payer: Self-pay

## 2021-04-12 ENCOUNTER — Other Ambulatory Visit: Payer: Self-pay

## 2021-04-25 ENCOUNTER — Other Ambulatory Visit (HOSPITAL_COMMUNITY): Payer: Self-pay

## 2021-04-29 ENCOUNTER — Other Ambulatory Visit (HOSPITAL_COMMUNITY): Payer: Self-pay

## 2021-05-08 ENCOUNTER — Other Ambulatory Visit: Payer: Self-pay

## 2021-05-09 ENCOUNTER — Other Ambulatory Visit: Payer: Self-pay

## 2021-05-10 ENCOUNTER — Other Ambulatory Visit: Payer: Self-pay

## 2021-05-24 ENCOUNTER — Other Ambulatory Visit (HOSPITAL_COMMUNITY): Payer: Self-pay

## 2021-05-27 DIAGNOSIS — E782 Mixed hyperlipidemia: Secondary | ICD-10-CM | POA: Diagnosis not present

## 2021-05-27 DIAGNOSIS — I2510 Atherosclerotic heart disease of native coronary artery without angina pectoris: Secondary | ICD-10-CM | POA: Diagnosis not present

## 2021-05-27 DIAGNOSIS — Z21 Asymptomatic human immunodeficiency virus [HIV] infection status: Secondary | ICD-10-CM | POA: Diagnosis not present

## 2021-05-30 ENCOUNTER — Other Ambulatory Visit (HOSPITAL_COMMUNITY): Payer: Self-pay

## 2021-06-03 ENCOUNTER — Other Ambulatory Visit (HOSPITAL_COMMUNITY): Payer: Self-pay

## 2021-06-03 DIAGNOSIS — Z0000 Encounter for general adult medical examination without abnormal findings: Secondary | ICD-10-CM | POA: Diagnosis not present

## 2021-06-03 DIAGNOSIS — I2510 Atherosclerotic heart disease of native coronary artery without angina pectoris: Secondary | ICD-10-CM | POA: Diagnosis not present

## 2021-06-03 DIAGNOSIS — Z21 Asymptomatic human immunodeficiency virus [HIV] infection status: Secondary | ICD-10-CM | POA: Diagnosis not present

## 2021-06-03 DIAGNOSIS — E782 Mixed hyperlipidemia: Secondary | ICD-10-CM | POA: Diagnosis not present

## 2021-06-03 DIAGNOSIS — Z1211 Encounter for screening for malignant neoplasm of colon: Secondary | ICD-10-CM | POA: Diagnosis not present

## 2021-06-03 MED ORDER — DOVATO 50-300 MG PO TABS
1.0000 | ORAL_TABLET | Freq: Every day | ORAL | 11 refills | Status: DC
  Filled 2021-06-27 – 2021-06-28 (×3): qty 30, 30d supply, fill #0

## 2021-06-10 ENCOUNTER — Telehealth: Payer: Self-pay | Admitting: Cardiovascular Disease

## 2021-06-10 ENCOUNTER — Telehealth: Payer: Self-pay

## 2021-06-10 ENCOUNTER — Other Ambulatory Visit: Payer: Self-pay

## 2021-06-10 ENCOUNTER — Other Ambulatory Visit (HOSPITAL_COMMUNITY): Payer: Self-pay

## 2021-06-10 DIAGNOSIS — Z86010 Personal history of colonic polyps: Secondary | ICD-10-CM

## 2021-06-10 MED ORDER — NA SULFATE-K SULFATE-MG SULF 17.5-3.13-1.6 GM/177ML PO SOLN
1.0000 | Freq: Once | ORAL | 0 refills | Status: AC
  Filled 2021-06-10: qty 354, 1d supply, fill #0

## 2021-06-10 NOTE — Progress Notes (Signed)
Gastroenterology Pre-Procedure Review ? ?Request Date: 07/01/2021 ?Requesting Physician: Dr. Allen Norris ? ?PATIENT REVIEW QUESTIONS: The patient responded to the following health history questions as indicated:   ? ?1. Are you having any GI issues? no ?2. Do you have a personal history of Polyps? yes (last colonoscopy) ?3. Do you have a family history of Colon Cancer or Polyps? no ?4. Diabetes Mellitus? no ?5. Joint replacements in the past 12 months?no ?6. Major health problems in the past 3 months?no ?7. Any artificial heart valves, MVP, or defibrillator?no ?   ?MEDICATIONS & ALLERGIES:    ?Patient reports the following regarding taking any anticoagulation/antiplatelet therapy:   ?Plavix, Coumadin, Eliquis, Xarelto, Lovenox, Pradaxa, Brilinta, or Effient? Brillinta  ?Aspirin? yes (81 m) ? ?Patient confirms/reports the following medications:  ?Current Outpatient Medications  ?Medication Sig Dispense Refill  ? acetaminophen (TYLENOL) 325 MG tablet Take 650 mg by mouth every 6 (six) hours as needed for moderate pain or headache.    ? aspirin 81 MG chewable tablet Chew 1 tablet (81 mg total) by mouth daily.    ? bictegravir-emtricitabine-tenofovir AF (BIKTARVY) 50-200-25 MG TABS tablet Take 1 tablet by mouth once daily 30 tablet 11  ? diphenhydramine-acetaminophen (TYLENOL PM) 25-500 MG TABS tablet Take 1 tablet by mouth at bedtime as needed (sleep).    ? dolutegravir-lamiVUDine (DOVATO) 50-300 MG tablet Take 1 tablet by mouth once daily 30 tablet 11  ? ezetimibe (ZETIA) 10 MG tablet TAKE 1 TABLET (10 MG TOTAL) BY MOUTH DAILY. 90 tablet 4  ? fluticasone (FLONASE) 50 MCG/ACT nasal spray Place 1 spray into both nostrils daily as needed for allergies or rhinitis.    ? isosorbide mononitrate (IMDUR) 30 MG 24 hr tablet TAKE 1 TABLET BY MOUTH DAILY. 90 tablet 3  ? nitroGLYCERIN (NITROSTAT) 0.4 MG SL tablet Place 1 tablet (0.4 mg total) under the tongue every 5 (five) minutes as needed for chest pain. 25 tablet 0  ? pantoprazole  (PROTONIX) 40 MG tablet TAKE 1 TABLET BY MOUTH DAILY. 90 tablet 1  ? Potassium 99 MG TABS Take 300 mg by mouth daily.    ? ranolazine (RANEXA) 1000 MG SR tablet Take 1 tablet (1,000 mg total) by mouth 2 (two) times daily. 60 tablet 6  ? rosuvastatin (CRESTOR) 40 MG tablet TAKE 1 TABLET BY MOUTH DAILY. 90 tablet 4  ? ticagrelor (BRILINTA) 60 MG TABS tablet TAKE 1 TABLET BY MOUTH TWICE DAILY 180 tablet 4  ? ?No current facility-administered medications for this visit.  ? ? ?Patient confirms/reports the following allergies:  ?No Known Allergies ? ?No orders of the defined types were placed in this encounter. ? ? ?AUTHORIZATION INFORMATION ?Primary Insurance: ?1D#: ?Group #: ? ?Secondary Insurance: ?1D#: ?Group #: ? ?SCHEDULE INFORMATION: ?Date: 07/01/2021 ?Time: ?Location:[facility] ? ?

## 2021-06-10 NOTE — Telephone Encounter (Signed)
Rosetta Posner 61 year old male is requesting preoperative cardiac evaluation for colonoscopy.  He was last seen in the clinic on 02/05/2021.  He remained stable from a cardiac standpoint at that time.  His cardiac catheterization 3/22 showed patent stents in his LAD circumflex and RCA.  His mid-distal LAD showed moderate disease with significant taper from proximal-mid region and small caliber vessels with mid-distal.  His EF was normal. ? ? ?His PMH includes coronary artery disease, stage III CKD, HLD, GERD, and HIV. ? ? ? ?May his Brilinta be held prior to his procedure? ? ?Please direct your response to CV DIV preop pool.  Thank you for your help. ? ?Jossie Ng. Rani Sisney NP-C ? ?  ?06/10/2021, 4:05 PM ?[place] ?[address] ?Office [phone] Fax [phone] ? ?

## 2021-06-10 NOTE — Telephone Encounter (Signed)
? ?  Pre-operative Risk Assessment  ?  ?Patient Name: Daniel Black  ?DOB: [birth year] ?MRN: [id number]  ? ?  ? ?Request for Surgical Clearance   ? ?Procedure:   Colonoscopy  ? ?Date of Surgery:  Clearance 07/01/21                              ?   ?Surgeon:  not listed ?Surgeon's Group or Practice Name:  [facility]  ?Phone number:  [phone] ?Fax number:  [phone] ?  ?Type of Clearance Requested:   ?- Pharmacy:  Hold Ticagrelor (Brilinta) instructions  ?  ?Type of Anesthesia:  Not Indicated ?  ?Additional requests/questions:   ? ?Signed, ?Caryl Pina Gerringer   ?06/10/2021, 3:04 PM  ? ?

## 2021-06-10 NOTE — Telephone Encounter (Signed)
CALLED PATIENT NO ANSWER LEFT VOICEMAIL FOR A CALL BACK ? ?

## 2021-06-12 ENCOUNTER — Other Ambulatory Visit (HOSPITAL_COMMUNITY): Payer: Self-pay

## 2021-06-17 NOTE — Progress Notes (Signed)
Resent blood thinner clearance  ?

## 2021-06-19 ENCOUNTER — Telehealth: Payer: Self-pay

## 2021-06-19 ENCOUNTER — Other Ambulatory Visit: Payer: Self-pay

## 2021-06-19 NOTE — Telephone Encounter (Signed)
? ? ?  Patient Name: Daniel Black  ?DOB: 1960-11-13 ?MRN: [id number] ? ?Primary Cardiologist: Ida Rogue, MD ? ?Chart reviewed as part of pre-operative protocol coverage. Given past medical history and time since last visit, based on ACC/AHA guidelines, Daniel Black would be at acceptable risk for the planned procedure without further cardiovascular testing.  ? ?Per Dr. Rockey Situ: "Acceptable risk for procedure  ?Would hold brilinta 5 days prior to surgery  ?Continue asa 81 daily" ? ?I will route this recommendation to the requesting party via Epic fax function and remove from pre-op pool. ? ?Please call with questions. ? ?Almyra Deforest, [place] ?06/19/2021, 3:29 PM ? ?

## 2021-06-19 NOTE — Telephone Encounter (Signed)
I was unable to reach the patient.  I did leave a message to hold Brilinta for 5 days prior to the procedure.  Please make sure the patient has received the message. ?

## 2021-06-19 NOTE — Progress Notes (Signed)
Patient called me back and he understands the blood thinner clearance for his brilinta to stop  5 days prior per hao,meng pa  ?

## 2021-06-19 NOTE — Telephone Encounter (Signed)
CALLED PATIENT NO ANSWER LEFT VOICEMAIL FOR A CALL BACK ?To talk to patient about his blood thinner ? ?

## 2021-06-19 NOTE — Telephone Encounter (Signed)
Surgeon's office will direct the pt as to when to hold Brilinta and when to restart.  ?

## 2021-06-20 ENCOUNTER — Encounter: Payer: Self-pay | Admitting: Gastroenterology

## 2021-06-26 ENCOUNTER — Other Ambulatory Visit (HOSPITAL_COMMUNITY): Payer: Self-pay

## 2021-06-27 ENCOUNTER — Other Ambulatory Visit: Payer: Self-pay

## 2021-06-27 ENCOUNTER — Ambulatory Visit (INDEPENDENT_AMBULATORY_CARE_PROVIDER_SITE_OTHER): Payer: 59 | Admitting: Pharmacist

## 2021-06-27 ENCOUNTER — Ambulatory Visit (INDEPENDENT_AMBULATORY_CARE_PROVIDER_SITE_OTHER): Payer: 59

## 2021-06-27 ENCOUNTER — Ambulatory Visit
Admission: EM | Admit: 2021-06-27 | Discharge: 2021-06-27 | Disposition: A | Discharge status: Home / Self Care | Payer: 59 | Attending: Internal Medicine | Admitting: Internal Medicine

## 2021-06-27 ENCOUNTER — Other Ambulatory Visit (HOSPITAL_COMMUNITY): Payer: Self-pay

## 2021-06-27 DIAGNOSIS — S61531A Puncture wound without foreign body of right wrist, initial encounter: Secondary | ICD-10-CM

## 2021-06-27 DIAGNOSIS — M7989 Other specified soft tissue disorders: Secondary | ICD-10-CM | POA: Diagnosis not present

## 2021-06-27 DIAGNOSIS — B20 Human immunodeficiency virus [HIV] disease: Secondary | ICD-10-CM

## 2021-06-27 MED ORDER — AMOXICILLIN-POT CLAVULANATE 875-125 MG PO TABS: 1.0000 | ORAL_TABLET | Freq: Two times a day (BID) | ORAL | 0 refills | Status: DC

## 2021-06-27 NOTE — ED Provider Notes (Addendum)
?[facility] ? ? ? ?CSN: [id number] ?Arrival date & time: 06/27/21  1335 ? ? ?  ? ?History   ?Chief Complaint ?Chief Complaint  ?Patient presents with  ? Puncture Wound  ? ? ?HPI ?Daniel Black is a 61 y.o. male who presents with a vut obn dorsal R wrist which occurred from the beak of a rooster around 12 pm today. Pain is provoked with movement of his thumb and wrist. The rooster's beak was very long. Has  sweelling and pain from his forearm to his middle fingers.  ? ? ? ?Past Medical History:  ?Diagnosis Date  ? Asymptomatic HIV infection ([place])   ? Bell's palsy 2004  ? CAD (coronary artery disease)   ? a. MI in 2004 w/ PCI to LAD and mRCA; b. LHC 07/2008: patent LAD stent, 30% distal LAD stenosis, 25% proximal LCx stenosis, 20% mid LCx stenosis, 95% mid RCA stenosis s/p PCI/DES (Xience 3.5 x 28 mm), and 40% distal RCA stenosis  ? DDD (degenerative disc disease) 10/02  ? has lumbar fusion  ? Erectile dysfunction   ? HLD (hyperlipidemia)   ? mixed  ? Myocardial infarction [facility]) 2004  ? x 3- last was July 2019 has 6 stents  ? Nephrolithiasis   ? stage 3  ? Skin cancer, basal cell   ? skin  ? ? ?Patient Active Problem List  ? Diagnosis Date Noted  ? Unstable angina ([place]) 06/14/2020  ? Acute ST elevation myocardial infarction (STEMI) of inferior wall ([place]) 11/16/2017  ? CAD in native artery 11/16/2017  ? AKI (acute kidney injury) ([place]) 11/16/2017  ? Special screening for malignant neoplasms, colon   ? Benign neoplasm of ascending colon   ? Human immunodeficiency virus (HIV) disease ([place]) 11/25/2009  ? Hyperlipidemia 11/23/2009  ? Coronary atherosclerosis 11/23/2009  ? Chest pain 11/23/2009  ? ? ?Past Surgical History:  ?Procedure Laterality Date  ? AMPUTATION  1985  ? L fingers reattachement   ? back fusion    ? lumbar  ? CARDIAC CATHETERIZATION    ? x2. 2004, 2010. 3 stents(total)  ? CHOLECYSTECTOMY  3/03  ? COLONOSCOPY WITH PROPOFOL N/A 06/13/2016  ? Procedure: COLONOSCOPY WITH PROPOFOL;  Surgeon: Lucilla Lame, MD;  Location: [facility];  Service: Endoscopy;  Laterality: N/A;  ? CORONARY/GRAFT ACUTE MI REVASCULARIZATION N/A 11/16/2017  ? Procedure: Coronary/Graft Acute MI Revascularization;  Surgeon: [name], MD;  Location: [facility];  Service: Cardiovascular;  Laterality: N/A;  ? LEFT HEART CATH AND CORONARY ANGIOGRAPHY N/A 11/16/2017  ? Procedure: LEFT HEART CATH AND CORONARY ANGIOGRAPHY;  Surgeon: [name], MD;  Location: [facility];  Service: Cardiovascular;  Laterality: N/A;  ? LEFT HEART CATH AND CORONARY ANGIOGRAPHY N/A 11/19/2017  ? Procedure: LEFT HEART CATH AND CORONARY ANGIOGRAPHY;  Surgeon: [name], MD;  Location: [facility];  Service: Cardiovascular;  Laterality: N/A;  ? LEFT HEART CATH AND CORONARY ANGIOGRAPHY Left 06/14/2020  ? Procedure: LEFT HEART CATH AND CORONARY ANGIOGRAPHY;  Surgeon: [name], MD;  Location: [facility];  Service: Cardiovascular;  Laterality: Left;  ? POLYPECTOMY  06/13/2016  ? Procedure: POLYPECTOMY;  Surgeon: Lucilla Lame, MD;  Location: [facility];  Service: Endoscopy;;  ? WRIST SURGERY  10/2011  ? ? ? ? ? ?Home Medications   ? ?Prior to Admission medications   ?Medication Sig Start Date End Date Taking? Authorizing Provider  ?amoxicillin-clavulanate (AUGMENTIN) 875-125 MG tablet Take  1 tablet by mouth every 12 (twelve) hours. 06/27/21  Yes Rodriguez-Southworth, Sunday Spillers, PA-C  ?aspirin 81 MG chewable tablet Chew 1 tablet (81 mg total) by mouth daily. 11/20/17  Yes Dustin Flock, MD  ?dolutegravir-lamiVUDine (DOVATO) 50-300 MG tablet Take 1 tablet by mouth once daily 06/03/21  Yes   ?ezetimibe (ZETIA) 10 MG tablet TAKE 1 TABLET (10 MG TOTAL) BY MOUTH DAILY. 07/02/20 07/23/21 Yes Gollan, Kathlene November, MD  ?fluticasone (FLONASE) 50 MCG/ACT nasal spray Place 1 spray into both nostrils daily as needed for allergies or rhinitis.   Yes [provider]  ?isosorbide mononitrate (IMDUR) 30 MG 24 hr  tablet TAKE 1 TABLET BY MOUTH DAILY. 04/09/21 04/09/22 Yes Loel Dubonnet, NP  ?rosuvastatin (CRESTOR) 40 MG tablet TAKE 1 TABLET BY MOUTH DAILY. 07/02/20 07/02/21 Yes Gollan, Kathlene November, MD  ?ticagrelor (BRILINTA) 60 MG TABS tablet TAKE 1 TABLET BY MOUTH TWICE DAILY 07/02/20 07/23/21 Yes Gollan, Kathlene November, MD  ?acetaminophen (TYLENOL) 325 MG tablet Take 650 mg by mouth every 6 (six) hours as needed for moderate pain or headache.    [provider]  ?diphenhydramine-acetaminophen (TYLENOL PM) 25-500 MG TABS tablet Take 1 tablet by mouth at bedtime as needed (sleep).    [provider]  ?nitroGLYCERIN (NITROSTAT) 0.4 MG SL tablet Place 1 tablet (0.4 mg total) under the tongue every 5 (five) minutes as needed for chest pain. 01/23/21 06/10/21  [name], MD  ?pantoprazole (PROTONIX) 40 MG tablet TAKE 1 TABLET BY MOUTH DAILY. 08/22/19 06/20/21  Loel Dubonnet, NP  ? ? ?Family History ?Family History  ?Problem Relation Age of Onset  ? Heart disease Father   ? Kidney disease Father   ? Cancer Father   ? Hearing loss Father   ? Hypertension Father   ? Stroke Father   ? Vision loss Father   ? Coronary artery disease Father   ? Varicose Veins Father   ? Hypertension Mother   ? Hearing loss Mother   ? Heart disease Mother   ? Coronary artery disease Other   ?     family hx  ? ? ?Social History ?Social History  ? ?Tobacco Use  ? Smoking status: Former  ?  Types: Cigarettes  ?  Quit date: 04/07/2010  ?  Years since quitting: 11.2  ? Smokeless tobacco: Never  ?Vaping Use  ? Vaping Use: Never used  ?Substance Use Topics  ? Alcohol use: No  ?  Comment: occasional (1x/mo)  ? Drug use: No  ? ? ? ?Allergies   ?Patient has no known allergies. ? ? ?Review of Systems ?Review of Systems  ?Musculoskeletal:  Positive for arthralgias and joint swelling.  ?Skin:  Positive for wound. Negative for color change, pallor and rash.  ? ? ?Physical Exam ?Triage Vital Signs ?ED Triage Vitals  ?Enc Vitals Group  ?   BP 06/27/21 1401  (!) 127/93  ?   Pulse Rate 06/27/21 1401 66  ?   Resp 06/27/21 1401 18  ?   Temp 06/27/21 1401 98.2 ?F (36.8 ?C)  ?   Temp Source 06/27/21 1401 Oral  ?   SpO2 06/27/21 1401 98 %  ?   Weight 06/27/21 1359 190 lb (86.2 kg)  ?   Height 06/27/21 1359 '5\' 10"'$  (1.778 m)  ?   Head Circumference --   ?   Peak Flow --   ?   Pain Score 06/27/21 1358 7  ?   Pain Loc --   ?  Pain Edu? --   ?   Excl. in Diagonal? --   ? ?No data found. ? ?Updated Vital Signs ?BP (!) 127/93 (BP Location: Left Arm)   Pulse 66   Temp 98.2 ?F (36.8 ?C) (Oral)   Resp 18   Ht '5\' 10"'$  (1.778 m)   Wt 190 lb (86.2 kg)   SpO2 98%   BMI 27.26 kg/m?  ? ?Visual Acuity ?Right Eye Distance:   ?Left Eye Distance:   ?Bilateral Distance:   ? ?Right Eye Near:   ?Left Eye Near:    ?Bilateral Near:    ? ?Physical Exam ?Vitals and nursing note reviewed.  ?Constitutional:   ?   General: He is not in acute distress. ?   Appearance: He is not toxic-appearing.  ?HENT:  ?   Right Ear: External ear normal.  ?   Left Ear: External ear normal.  ?Eyes:  ?   General: No scleral icterus. ?   Conjunctiva/sclera: Conjunctivae normal.  ?Pulmonary:  ?   Effort: Pulmonary effort is normal.  ?Musculoskeletal:  ?   Cervical back: Neck supple.  ?   Comments: Has decreased ROM of R wrist due to pain  ?Skin: ?   General: Skin is warm and dry.  ?   Comments: R WRIST- with 1/2 cm puncture wound which has serosanguineous matter draining from it. Has moderate swelling dorsally, but no ecchymosis noted. Unable to flex wrist down due to pain, but able to dorsiflex it just fine. Radial and ulnar pulses are intact. Finger grip is weak due to pain.   ?Neurological:  ?   Mental Status: He is alert and oriented to person, place, and time.  ?   Gait: Gait normal.  ?Psychiatric:     ?   Mood and Affect: Mood normal.     ?   Behavior: Behavior normal.     ?   Thought Content: Thought content normal.     ?   Judgment: Judgment normal.  ? ? ? ?UC Treatments / Results  ?Labs ?(all labs ordered are  listed, but only abnormal results are displayed) ?Labs Reviewed - No data to display ? ?EKG ? ? ?Radiology ?DG Wrist Complete Right ? ?Result Date: 06/27/2021 ?CLINICAL DATA:  Puncture wound from [place] pain and

## 2021-06-27 NOTE — Discharge Instructions (Signed)
Please follow up with orthopedics tomorrow ?

## 2021-06-27 NOTE — ED Triage Notes (Signed)
Pt c/o a cut to the right wrist. Pt was stabbed by a rooster around 12. ? ?Pt states that the wound is deep and he is having swelling and pain from his forearm down to his fingers. Pt states that his thumb hurts when moving.  ?

## 2021-06-28 ENCOUNTER — Other Ambulatory Visit (HOSPITAL_COMMUNITY): Payer: Self-pay

## 2021-06-28 ENCOUNTER — Other Ambulatory Visit: Payer: Self-pay

## 2021-06-28 MED ORDER — DOVATO 50-300 MG PO TABS
1.0000 | ORAL_TABLET | Freq: Every day | ORAL | 11 refills | Status: DC
  Filled 2021-06-28 – 2021-07-22 (×2): qty 30, 30d supply, fill #0
  Filled 2021-08-15: qty 30, 30d supply, fill #1
  Filled 2021-09-17: qty 30, 30d supply, fill #2
  Filled 2021-10-10: qty 30, 30d supply, fill #3
  Filled 2021-11-06: qty 30, 30d supply, fill #4
  Filled 2021-12-10: qty 30, 30d supply, fill #5
  Filled 2022-01-10: qty 30, 30d supply, fill #6
  Filled 2022-02-04: qty 30, 30d supply, fill #7
  Filled 2022-03-05: qty 30, 30d supply, fill #8
  Filled 2022-05-01: qty 30, 30d supply, fill #9

## 2021-06-28 NOTE — Progress Notes (Signed)
Virtual Visit via Telephone Note ? ?I connected with Daniel Black on 06/28/21 at  3:15 PM EDT by telephone and verified that I am speaking with the correct person using two identifiers. ? ?Location: ?Patient: Home ?Provider: Office ?  ?I discussed the limitations, risks, security and privacy concerns of performing an evaluation and management service by telephone and the availability of in person appointments. I also discussed with the patient that there may be a patient responsible charge related to this service. The patient expressed understanding and agreed to proceed. ? ?HPI: Daniel Black is a 61 y.o. male who presents for follow-up of their long-term specialty medication, Dovato. ? ?Patient Active Problem List  ? Diagnosis Date Noted  ? Unstable angina ([place]) 06/14/2020  ? Acute ST elevation myocardial infarction (STEMI) of inferior wall ([place]) 11/16/2017  ? CAD in native artery 11/16/2017  ? AKI (acute kidney injury) ([place]) 11/16/2017  ? Special screening for malignant neoplasms, colon   ? Benign neoplasm of ascending colon   ? Human immunodeficiency virus (HIV) disease ([place]) 11/25/2009  ? Hyperlipidemia 11/23/2009  ? Coronary atherosclerosis 11/23/2009  ? Chest pain 11/23/2009  ? ? ?Patient's Medications  ?New Prescriptions  ? No medications on file  ?Previous Medications  ? ACETAMINOPHEN (TYLENOL) 325 MG TABLET    Take 650 mg by mouth every 6 (six) hours as needed for moderate pain or headache.  ? AMOXICILLIN-CLAVULANATE (AUGMENTIN) 875-125 MG TABLET    Take 1 tablet by mouth every 12 (twelve) hours.  ? ASPIRIN 81 MG CHEWABLE TABLET    Chew 1 tablet (81 mg total) by mouth daily.  ? DIPHENHYDRAMINE-ACETAMINOPHEN (TYLENOL PM) 25-500 MG TABS TABLET    Take 1 tablet by mouth at bedtime as needed (sleep).  ? DOLUTEGRAVIR-LAMIVUDINE (DOVATO) 50-300 MG TABLET    Take 1 tablet by mouth once daily  ? EZETIMIBE (ZETIA) 10 MG TABLET    TAKE 1 TABLET (10 MG TOTAL) BY MOUTH DAILY.  ? FLUTICASONE (FLONASE) 50  MCG/ACT NASAL SPRAY    Place 1 spray into both nostrils daily as needed for allergies or rhinitis.  ? ISOSORBIDE MONONITRATE (IMDUR) 30 MG 24 HR TABLET    TAKE 1 TABLET BY MOUTH DAILY.  ? NITROGLYCERIN (NITROSTAT) 0.4 MG SL TABLET    Place 1 tablet (0.4 mg total) under the tongue every 5 (five) minutes as needed for chest pain.  ? PANTOPRAZOLE (PROTONIX) 40 MG TABLET    TAKE 1 TABLET BY MOUTH DAILY.  ? ROSUVASTATIN (CRESTOR) 40 MG TABLET    TAKE 1 TABLET BY MOUTH DAILY.  ? TICAGRELOR (BRILINTA) 60 MG TABS TABLET    TAKE 1 TABLET BY MOUTH TWICE DAILY  ?Modified Medications  ? No medications on file  ?Discontinued Medications  ? No medications on file  ? ? ?Allergies: ?No Known Allergies ? ?Past Medical History: ?Past Medical History:  ?Diagnosis Date  ? Asymptomatic HIV infection ([place])   ? Bell's palsy 2004  ? CAD (coronary artery disease)   ? a. MI in 2004 w/ PCI to LAD and mRCA; b. LHC 07/2008: patent LAD stent, 30% distal LAD stenosis, 25% proximal LCx stenosis, 20% mid LCx stenosis, 95% mid RCA stenosis s/p PCI/DES (Xience 3.5 x 28 mm), and 40% distal RCA stenosis  ? DDD (degenerative disc disease) 10/02  ? has lumbar fusion  ? Erectile dysfunction   ? HLD (hyperlipidemia)   ? mixed  ? Myocardial infarction [facility]) 2004  ? x 3- last was July 2019 has 6  stents  ? Nephrolithiasis   ? stage 3  ? Skin cancer, basal cell   ? skin  ? ? ?Social History: ?Social History  ? ?Socioeconomic History  ? Marital status: Soil scientist  ?  Spouse name: Not on file  ? Number of children: Not on file  ? Years of education: Not on file  ? Highest education level: Not on file  ?Occupational History  ? Occupation: Orderly/CNA  ?  Employer: [employer]  ?  Comment: Full time  ?Tobacco Use  ? Smoking status: Former  ?  Types: Cigarettes  ?  Quit date: 04/07/2010  ?  Years since quitting: 11.2  ? Smokeless tobacco: Never  ?Vaping Use  ? Vaping Use: Never used  ?Substance and Sexual Activity  ? Alcohol use: No  ?  Comment: occasional (1x/mo)  ?  Drug use: No  ? Sexual activity: Not on file  ?Other Topics Concern  ? Not on file  ?Social History Narrative  ? Single - domestic partner.   ? Full time - orderly/ CNA  ? Regular exercise - ride, walk, run   ?   ?   ?   ? ?Social Determinants of Health  ? ?Financial Resource Strain: Not on file  ?Food Insecurity: Not on file  ?Transportation Needs: Not on file  ?Physical Activity: Not on file  ?Stress: Not on file  ?Social Connections: Not on file  ? ? ?Labs: ?Lab Results  ?Component Value Date  ? HIV1RNAQUANT 30 05/19/2019  ? HIV1RNAQUANT 50 11/11/2018  ? HIV1RNAQUANT <20 05/13/2018  ? ? ?RPR and STI ?Lab Results  ?Component Value Date  ? LABRPR NON REACTIVE 05/19/2019  ? LABRPR Non Reactive 05/13/2018  ? LABRPR Non Reactive 04/20/2015  ? ? ?   ? View : No data to display.  ?  ?  ?  ? ? ?Hepatitis B ?Lab Results  ?Component Value Date  ? HEPBSAG Negative 05/13/2018  ? ?Hepatitis C ?No results found for: [place], HCVRNAPCRQN ?Hepatitis A ?No results found for: HAV ?Lipids: ?Lab Results  ?Component Value Date  ? CHOL 86 07/03/2020  ? TRIG 43 07/03/2020  ? HDL 49 07/03/2020  ? CHOLHDL 1.8 07/03/2020  ? VLDL 9 07/03/2020  ? [place] 28 07/03/2020  ? ? ?Assessment: ?I spoke with Daniel Black today regarding their specialty medication, Dovato. Patient was previously taking Biktarvy but was switched to [place] recently by his physician. He has not started Dovato yet.  ? ?Explained that Dovato is a one pill once daily medication with or without food and the importance of not missing any doses. Explained resistance and how it develops and why it is so important to take Dovato daily and not skip days or doses. Counseled patient to take it around the same time each day. Counseled on what to do if dose is missed, if closer to missed dose take immediately, if closer to next dose then skip and resume normal schedule.  ? ?Cautioned on possible side effects the first week or so including nausea, diarrhea, dizziness, and headaches but that  they should resolve after the first couple of weeks. I reviewed patient medications and found no drug interactions. Counseled patient to separate Dovato from divalent cations including multivitamins. Discussed with patient to call his doctor if he starts a new medication or herbal supplement. All questions answered. Will follow up in 1 year. ? ?Plan: ?- Continue Dovato ?- Follow up in 1 year ?  ?I discussed the assessment and treatment plan with  the patient. The patient was provided an opportunity to ask questions and all were answered. The patient agreed with the plan and demonstrated an understanding of the instructions. ?  ?The patient was advised to call back or seek an in-person evaluation if the symptoms worsen or if the condition fails to improve as anticipated. ? ?I provided 15 minutes of non-face-to-face time during this encounter. ? ?Daniel Black, PharmD, BCIDP, AAHIVP, CPP ?Clinical Pharmacist Practitioner ?Infectious Diseases Clinical Pharmacist ?[facility] ?06/28/2021, 9:43 AM ? ? ?

## 2021-07-01 ENCOUNTER — Ambulatory Visit
Admission: RE | Admit: 2021-07-01 | Discharge: 2021-07-01 | Disposition: A | Discharge status: Home / Self Care | Payer: 59 | Attending: Gastroenterology | Admitting: Gastroenterology

## 2021-07-01 ENCOUNTER — Encounter
Admission: RE | Disposition: A | Discharge status: Home / Self Care | Payer: Self-pay | Source: Home / Self Care | Attending: Gastroenterology

## 2021-07-01 ENCOUNTER — Encounter: Payer: Self-pay | Admitting: Gastroenterology

## 2021-07-01 ENCOUNTER — Ambulatory Visit: Payer: 59 | Admitting: Anesthesiology

## 2021-07-01 ENCOUNTER — Other Ambulatory Visit: Payer: Self-pay

## 2021-07-01 ENCOUNTER — Other Ambulatory Visit (HOSPITAL_COMMUNITY): Payer: Self-pay

## 2021-07-01 DIAGNOSIS — Z1211 Encounter for screening for malignant neoplasm of colon: Secondary | ICD-10-CM | POA: Insufficient documentation

## 2021-07-01 DIAGNOSIS — I2510 Atherosclerotic heart disease of native coronary artery without angina pectoris: Secondary | ICD-10-CM | POA: Insufficient documentation

## 2021-07-01 DIAGNOSIS — I252 Old myocardial infarction: Secondary | ICD-10-CM | POA: Diagnosis not present

## 2021-07-01 DIAGNOSIS — Z86010 Personal history of colonic polyps: Secondary | ICD-10-CM

## 2021-07-01 DIAGNOSIS — G510 Bell's palsy: Secondary | ICD-10-CM | POA: Insufficient documentation

## 2021-07-01 DIAGNOSIS — Z6827 Body mass index (BMI) 27.0-27.9, adult: Secondary | ICD-10-CM | POA: Insufficient documentation

## 2021-07-01 DIAGNOSIS — D123 Benign neoplasm of transverse colon: Secondary | ICD-10-CM | POA: Diagnosis not present

## 2021-07-01 DIAGNOSIS — K635 Polyp of colon: Secondary | ICD-10-CM

## 2021-07-01 DIAGNOSIS — K648 Other hemorrhoids: Secondary | ICD-10-CM | POA: Diagnosis not present

## 2021-07-01 DIAGNOSIS — Z87891 Personal history of nicotine dependence: Secondary | ICD-10-CM | POA: Diagnosis not present

## 2021-07-01 DIAGNOSIS — Z955 Presence of coronary angioplasty implant and graft: Secondary | ICD-10-CM | POA: Insufficient documentation

## 2021-07-01 DIAGNOSIS — Z21 Asymptomatic human immunodeficiency virus [HIV] infection status: Secondary | ICD-10-CM | POA: Diagnosis not present

## 2021-07-01 HISTORY — PX: COLONOSCOPY WITH PROPOFOL: SHX5780

## 2021-07-01 HISTORY — PX: POLYPECTOMY: SHX5525

## 2021-07-01 SURGERY — COLONOSCOPY WITH PROPOFOL
Anesthesia: General | Site: Rectum

## 2021-07-01 MED ORDER — STERILE WATER FOR IRRIGATION IR SOLN
Status: DC | PRN
  Administered 2021-07-01: 250 mL

## 2021-07-01 MED ORDER — STERILE WATER FOR IRRIGATION IR SOLN
Status: DC | PRN
  Administered 2021-07-01: .05 mL

## 2021-07-01 MED ORDER — OXYCODONE HCL 5 MG/5ML PO SOLN: 5.0000 mg | Freq: Once | ORAL | Status: DC | PRN

## 2021-07-01 MED ORDER — OXYCODONE HCL 5 MG PO TABS: 5.0000 mg | ORAL_TABLET | Freq: Once | ORAL | Status: DC | PRN

## 2021-07-01 MED ORDER — SODIUM CHLORIDE 0.9 % IV SOLN: INTRAVENOUS | Status: DC

## 2021-07-01 MED ORDER — PROPOFOL 10 MG/ML IV BOLUS
INTRAVENOUS | Status: DC | PRN
Start: 2021-07-01 — End: 2021-07-01
  Administered 2021-07-01: 140 mg via INTRAVENOUS
  Administered 2021-07-01: 40 mg via INTRAVENOUS
  Administered 2021-07-01: 20 mg via INTRAVENOUS
  Administered 2021-07-01: 40 mg via INTRAVENOUS
  Administered 2021-07-01: 20 mg via INTRAVENOUS
  Administered 2021-07-01 (×2): 40 mg via INTRAVENOUS

## 2021-07-01 MED ORDER — LACTATED RINGERS IV SOLN: INTRAVENOUS | Status: DC

## 2021-07-01 SURGICAL SUPPLY — 8 items
GOWN CVR UNV OPN BCK APRN NK (MISCELLANEOUS) ×4 IMPLANT
GOWN ISOL THUMB LOOP REG UNIV (MISCELLANEOUS) ×6
KIT PRC NS LF DISP ENDO (KITS) ×2 IMPLANT
KIT PROCEDURE OLYMPUS (KITS) ×3
MANIFOLD NEPTUNE II (INSTRUMENTS) ×3 IMPLANT
SNARE COLD EXACTO (MISCELLANEOUS) ×1 IMPLANT
TRAP ETRAP POLY (MISCELLANEOUS) ×1 IMPLANT
WATER STERILE IRR 250ML POUR (IV SOLUTION) ×3 IMPLANT

## 2021-07-01 NOTE — Anesthesia Preprocedure Evaluation (Signed)
Anesthesia Evaluation  ?Patient identified by MRN, date of birth, ID band ?Patient awake ? ? ? ?Reviewed: ?NPO status  ? ?History of Anesthesia Complications ?Negative for: history of anesthetic complications ? ?Airway ?Mallampati: II ? ?TM Distance: >3 FB ?Neck ROM: full ? ? ? Dental ?no notable dental hx. ? ?  ?Pulmonary ?neg pulmonary ROS, former smoker,  ?  ?Pulmonary exam normal ? ? ? ? ? ? ? Cardiovascular ?Exercise Tolerance: Good ?(-) angina+ CAD, + Past MI (x3 2019) and + Cardiac Stents (x6)  ?Normal cardiovascular exam ? ? ?  ?Neuro/Psych ?Bell's palsy 2004 ? ?negative neurological ROS ? negative psych ROS  ? GI/Hepatic ?negative GI ROS, Neg liver ROS,   ?Endo/Other  ?Morbid obesity (bmi 27) ? Renal/GU ?negative Renal ROS  ?negative genitourinary ?  ?Musculoskeletal ? ?(+) Arthritis ,  ? Abdominal ?  ?Peds ? Hematology ? ?(+) HIV,   ?Anesthesia Other Findings ?pcp:  Angelena Form, MD at 06/03/2021 ; ? ?cardiac catheterization March 2022: ?patent stents, 50% mid LAD disease ? ?cards cleared: 06/2021: Rockey Situ; ? ?last brilinta & aspirin: 6 days ago. ? Reproductive/Obstetrics ? ?  ? ? ? ? ? ? ? ? ? ? ? ? ? ?  ?  ? ? ? ? ? ? ? ? ?Anesthesia Physical ?Anesthesia Plan ? ?ASA: 3 ? ?Anesthesia Plan: General  ? ?Post-op Pain Management:   ? ?Induction:  ? ?PONV Risk Score and Plan: 2 and TIVA and Propofol infusion ? ?Airway Management Planned:  ? ?Additional Equipment:  ? ?Intra-op Plan:  ? ?Post-operative Plan:  ? ?Informed Consent: I have reviewed the patients History and Physical, chart, labs and discussed the procedure including the risks, benefits and alternatives for the proposed anesthesia with the patient or authorized representative who has indicated his/her understanding and acceptance.  ? ? ? ? ? ?Plan Discussed with: CRNA ? ?Anesthesia Plan Comments:   ? ? ? ? ? ? ?Anesthesia Quick Evaluation ? ?

## 2021-07-01 NOTE — Op Note (Signed)
[facility] ?Gastroenterology ?Patient Name: Daniel Black ?Procedure Date: 07/01/2021 9:51 AM ?MRN: [id number] ?Account #: [id number] ?Date of Birth: 1961-02-07 ?Admit Type: Outpatient ?Age: 61 ?Room: [facility] OR ROOM 01 ?Gender: Male ?Note Status: Finalized ?Instrument Name: [id number] ?Procedure:             Colonoscopy ?Indications:           High risk colon cancer surveillance: Personal history  ?                       of colonic polyps ?Providers:             Lucilla Lame MD, MD ?Referring MD:          [name], MD (Referring MD) ?Medicines:             Propofol per Anesthesia ?Complications:         No immediate complications. ?Procedure:             Pre-Anesthesia Assessment: ?                       - Prior to the procedure, a History and Physical was  ?                       performed, and patient medications and allergies were  ?                       reviewed. The patient's tolerance of previous  ?                       anesthesia was also reviewed. The risks and benefits  ?                       of the procedure and the sedation options and risks  ?                       were discussed with the patient. All questions were  ?                       answered, and informed consent was obtained. Prior  ?                       Anticoagulants: The patient has taken no previous  ?                       anticoagulant or antiplatelet agents. ASA Grade  ?                       Assessment: II - A patient with mild systemic disease.  ?                       After reviewing the risks and benefits, the patient  ?                       was deemed in satisfactory condition to undergo the  ?                       procedure. ?  After obtaining informed consent, the colonoscope was  ?                       passed under direct vision. Throughout the procedure,  ?                       the patient's blood pressure, pulse, and oxygen  ?                       saturations were monitored  continuously. The  ?                       Colonoscope was introduced through the anus and  ?                       advanced to the the cecum, identified by appendiceal  ?                       orifice and ileocecal valve. The colonoscopy was  ?                       performed without difficulty. The patient tolerated  ?                       the procedure well. The quality of the bowel  ?                       preparation was excellent. ?Findings: ?     The perianal and digital rectal examinations were normal. ?     Two sessile polyps were found in the transverse colon. The polyps were 4  ?     to 5 mm in size. These polyps were removed with a cold snare. Resection  ?     and retrieval were complete. ?     Non-bleeding internal hemorrhoids were found during retroflexion. The  ?     hemorrhoids were Grade II (internal hemorrhoids that prolapse but reduce  ?     spontaneously). ?Impression:            - Two 4 to 5 mm polyps in the transverse colon,  ?                       removed with a cold snare. Resected and retrieved. ?                       - Non-bleeding internal hemorrhoids. ?Recommendation:        - Discharge patient to home. ?                       - Resume previous diet. ?                       - Continue present medications. ?                       - Await pathology results. ?                       - Repeat colonoscopy in 7 years for surveillance. ?Procedure Code(s):     --- Professional --- ?  45385, Colonoscopy, flexible; with removal of  ?                       tumor(s), polyp(s), or other lesion(s) by snare  ?                       technique ?Diagnosis Code(s):     --- Professional --- ?                       Z86.010, Personal history of colonic polyps ?                       K63.5, Polyp of colon ?CPT copyright 2019 American Medical Association. All rights reserved. ?The codes documented in this report are preliminary and upon coder review may  ?be revised to meet current  compliance requirements. ?Lucilla Lame MD, MD ?07/01/2021 10:19:12 AM ?This report has been signed electronically. ?Number of Addenda: 0 ?Note Initiated On: 07/01/2021 9:51 AM ?Scope Withdrawal Time: 0 hours 9 minutes 40 seconds  ?Total Procedure Duration: 0 hours 16 minutes 4 seconds  ?Estimated Blood Loss:  Estimated blood loss: none. ?     [facility] ?

## 2021-07-01 NOTE — H&P (Signed)
? ?Lucilla Lame, MD [facility] ?[address] ?[place], [place] [address] ?Phone:[phone] ?Fax : [phone] ? ?Primary Care Physician:  Leonel Ramsay, MD ?Primary Gastroenterologist:  Dr. Allen Norris ? ?Pre-Procedure History & Physical: ?HPI:  Daniel Black is a 61 y.o. male is here for an colonoscopy. ?  ?Past Medical History:  ?Diagnosis Date  ? Asymptomatic HIV infection ([place])   ? Bell's palsy 2004  ? CAD (coronary artery disease)   ? a. MI in 2004 w/ PCI to LAD and mRCA; b. LHC 07/2008: patent LAD stent, 30% distal LAD stenosis, 25% proximal LCx stenosis, 20% mid LCx stenosis, 95% mid RCA stenosis s/p PCI/DES (Xience 3.5 x 28 mm), and 40% distal RCA stenosis  ? DDD (degenerative disc disease) 10/02  ? has lumbar fusion  ? Erectile dysfunction   ? HLD (hyperlipidemia)   ? mixed  ? Myocardial infarction [facility]) 2004  ? x 3- last was July 2019 has 6 stents  ? Nephrolithiasis   ? stage 3  ? Skin cancer, basal cell   ? skin  ? ? ?Past Surgical History:  ?Procedure Laterality Date  ? AMPUTATION  1985  ? L fingers reattachement   ? back fusion    ? lumbar  ? CARDIAC CATHETERIZATION    ? x2. 2004, 2010. 3 stents(total)  ? CHOLECYSTECTOMY  3/03  ? COLONOSCOPY WITH PROPOFOL N/A 06/13/2016  ? Procedure: COLONOSCOPY WITH PROPOFOL;  Surgeon: Lucilla Lame, MD;  Location: [place];  Service: Endoscopy;  Laterality: N/A;  ? CORONARY/GRAFT ACUTE MI REVASCULARIZATION N/A 11/16/2017  ? Procedure: Coronary/Graft Acute MI Revascularization;  Surgeon: [name], MD;  Location: [place] CV LAB;  Service: Cardiovascular;  Laterality: N/A;  ? LEFT HEART CATH AND CORONARY ANGIOGRAPHY N/A 11/16/2017  ? Procedure: LEFT HEART CATH AND CORONARY ANGIOGRAPHY;  Surgeon: [name], MD;  Location: [place] CV LAB;  Service: Cardiovascular;  Laterality: N/A;  ? LEFT HEART CATH AND CORONARY ANGIOGRAPHY N/A 11/19/2017  ? Procedure: LEFT HEART CATH AND CORONARY ANGIOGRAPHY;  Surgeon: [name], MD;   Location: [place] CV LAB;  Service: Cardiovascular;  Laterality: N/A;  ? LEFT HEART CATH AND CORONARY ANGIOGRAPHY Left 06/14/2020  ? Procedure: LEFT HEART CATH AND CORONARY ANGIOGRAPHY;  Surgeon: [name], MD;  Location: [place] CV LAB;  Service: Cardiovascular;  Laterality: Left;  ? POLYPECTOMY  06/13/2016  ? Procedure: POLYPECTOMY;  Surgeon: Lucilla Lame, MD;  Location: [place];  Service: Endoscopy;;  ? WRIST SURGERY  10/2011  ? ? ?Prior to Admission medications   ?Medication Sig Start Date End Date Taking? Authorizing Provider  ?acetaminophen (TYLENOL) 325 MG tablet Take 650 mg by mouth every 6 (six) hours as needed for moderate pain or headache.   Yes [provider]  ?amoxicillin-clavulanate (AUGMENTIN) 875-125 MG tablet Take 1 tablet by mouth every 12 (twelve) hours. 06/27/21  Yes Rodriguez-Southworth, Sunday Spillers, PA-C  ?aspirin 81 MG chewable tablet Chew 1 tablet (81 mg total) by mouth daily. 11/20/17  Yes Dustin Flock, MD  ?diphenhydramine-acetaminophen (TYLENOL PM) 25-500 MG TABS tablet Take 1 tablet by mouth at bedtime as needed (sleep).   Yes [provider]  ?dolutegravir-lamiVUDine (DOVATO) 50-300 MG tablet Take 1 tablet by mouth once daily 06/28/21  Yes Kuppelweiser, Cassie L, RPH-CPP  ?ezetimibe (ZETIA) 10 MG tablet TAKE 1 TABLET (10 MG TOTAL) BY MOUTH DAILY. 07/02/20 07/23/21 Yes Gollan, Kathlene November, MD  ?fluticasone (FLONASE) 50 MCG/ACT nasal spray Place 1 spray into both  nostrils daily as needed for allergies or rhinitis.   Yes [provider]  ?isosorbide mononitrate (IMDUR) 30 MG 24 hr tablet TAKE 1 TABLET BY MOUTH DAILY. 04/09/21 04/09/22 Yes Loel Dubonnet, NP  ?pantoprazole (PROTONIX) 40 MG tablet TAKE 1 TABLET BY MOUTH DAILY. 08/22/19 07/01/21 Yes Loel Dubonnet, NP  ?rosuvastatin (CRESTOR) 40 MG tablet TAKE 1 TABLET BY MOUTH DAILY. 07/02/20 07/02/21 Yes Gollan, Kathlene November, MD  ?ticagrelor (BRILINTA) 60 MG TABS tablet TAKE 1 TABLET BY MOUTH TWICE  DAILY 07/02/20 07/23/21 Yes Gollan, Kathlene November, MD  ?nitroGLYCERIN (NITROSTAT) 0.4 MG SL tablet Place 1 tablet (0.4 mg total) under the tongue every 5 (five) minutes as needed for chest pain. 01/23/21 06/10/21  [name], MD  ? ? ?Allergies as of 06/10/2021  ? (No Known Allergies)  ? ? ?Family History  ?Problem Relation Age of Onset  ? Heart disease Father   ? Kidney disease Father   ? Cancer Father   ? Hearing loss Father   ? Hypertension Father   ? Stroke Father   ? Vision loss Father   ? Coronary artery disease Father   ? Varicose Veins Father   ? Hypertension Mother   ? Hearing loss Mother   ? Heart disease Mother   ? Coronary artery disease Other   ?     family hx  ? ? ?Social History  ? ?Socioeconomic History  ? Marital status: Soil scientist  ?  Spouse name: Not on file  ? Number of children: Not on file  ? Years of education: Not on file  ? Highest education level: Not on file  ?Occupational History  ? Occupation: Orderly/CNA  ?  Employer: [employer]  ?  Comment: Full time  ?Tobacco Use  ? Smoking status: Former  ?  Types: Cigarettes  ?  Quit date: 04/07/2010  ?  Years since quitting: 11.2  ? Smokeless tobacco: Never  ?Vaping Use  ? Vaping Use: Never used  ?Substance and Sexual Activity  ? Alcohol use: No  ?  Comment: occasional (1x/mo)  ? Drug use: No  ? Sexual activity: Not on file  ?Other Topics Concern  ? Not on file  ?Social History Narrative  ? Single - domestic partner.   ? Full time - orderly/ CNA  ? Regular exercise - ride, walk, run   ?   ?   ?   ? ?Social Determinants of Health  ? ?Financial Resource Strain: Not on file  ?Food Insecurity: Not on file  ?Transportation Needs: Not on file  ?Physical Activity: Not on file  ?Stress: Not on file  ?Social Connections: Not on file  ?Intimate Partner Violence: Not on file  ? ? ?Review of Systems: ?See HPI, otherwise negative ROS ? ?Physical Exam: ?BP (!) 132/91   Pulse 65   Temp 98.1 ?F (36.7 ?C) (Temporal)   Wt 85.3 kg   SpO2 97%   BMI 26.98 kg/m?   ?General:   Alert,  pleasant and cooperative in NAD ?Head:  Normocephalic and atraumatic. ?Neck:  Supple; no masses or thyromegaly. ?Lungs:  Clear throughout to auscultation.    ?Heart:  Regular rate and rhythm. ?Abdomen:  Soft, nontender and nondistended. Normal bowel sounds, without guarding, and without rebound.   ?Neurologic:  Alert and  oriented x4;  grossly normal neurologically. ? ?Impression/Plan: ?Daniel Black is here for an colonoscopy to be performed for a history of adenomatous polyps on 2018 ? ? ?Risks, benefits, limitations, and alternatives  regarding  colonoscopy have been reviewed with the patient.  Questions have been answered.  All parties agreeable. ? ? ?Lucilla Lame, MD  07/01/2021, 9:47 AM ?

## 2021-07-01 NOTE — Transfer of Care (Signed)
Immediate Anesthesia Transfer of Care Note ? ?Patient: Daniel Black ? ?Procedure(s) Performed: COLONOSCOPY WITH PROPOFOL (Rectum) ?POLYPECTOMY (Rectum) ? ?Patient Location: PACU ? ?Anesthesia Type: General ? ?Level of Consciousness: awake, alert  and patient cooperative ? ?Airway and Oxygen Therapy: Patient Spontanous Breathing and Patient connected to supplemental oxygen ? ?Post-op Assessment: Post-op Vital signs reviewed, Patient's Cardiovascular Status Stable, Respiratory Function Stable, Patent Airway and No signs of Nausea or vomiting ? ?Post-op Vital Signs: Reviewed and stable ? ?Complications: No notable events documented. ? ?

## 2021-07-01 NOTE — Anesthesia Postprocedure Evaluation (Signed)
Anesthesia Post Note ? ?Patient: Daniel Black ? ?Procedure(s) Performed: COLONOSCOPY WITH PROPOFOL (Rectum) ?POLYPECTOMY (Rectum) ? ? ?  ?Patient location during evaluation: PACU ?Anesthesia Type: General ?Level of consciousness: awake and alert ?Pain management: pain level controlled ?Vital Signs Assessment: post-procedure vital signs reviewed and stable ?Respiratory status: spontaneous breathing, nonlabored ventilation, respiratory function stable and patient connected to nasal cannula oxygen ?Cardiovascular status: blood pressure returned to baseline and stable ?Postop Assessment: no apparent nausea or vomiting ?Anesthetic complications: no ? ? ?No notable events documented. ? ?Fidel Levy ? ? ? ? ? ?

## 2021-07-02 ENCOUNTER — Encounter: Payer: Self-pay | Admitting: Gastroenterology

## 2021-07-02 LAB — SURGICAL PATHOLOGY

## 2021-07-22 ENCOUNTER — Other Ambulatory Visit (HOSPITAL_COMMUNITY): Payer: Self-pay

## 2021-07-26 ENCOUNTER — Other Ambulatory Visit (HOSPITAL_COMMUNITY): Payer: Self-pay

## 2021-07-28 ENCOUNTER — Other Ambulatory Visit: Payer: Self-pay | Admitting: Cardiovascular Disease

## 2021-07-29 ENCOUNTER — Other Ambulatory Visit: Payer: Self-pay

## 2021-07-29 MED ORDER — EZETIMIBE 10 MG PO TABS
ORAL_TABLET | Freq: Every day | ORAL | 0 refills | Status: DC
Start: 2021-07-29 — End: 2021-10-27
  Filled 2021-07-29: qty 90, 90d supply, fill #0

## 2021-07-29 MED ORDER — ROSUVASTATIN CALCIUM 40 MG PO TABS
ORAL_TABLET | Freq: Every day | ORAL | 0 refills | Status: DC
  Filled 2021-07-29: qty 90, 90d supply, fill #0

## 2021-08-05 ENCOUNTER — Other Ambulatory Visit: Payer: Self-pay | Admitting: Cardiovascular Disease

## 2021-08-05 ENCOUNTER — Other Ambulatory Visit: Payer: Self-pay

## 2021-08-05 NOTE — Telephone Encounter (Signed)
Scheduled

## 2021-08-05 NOTE — Telephone Encounter (Signed)
Please schedule 6 month F/U appointment. Thank you! 

## 2021-08-05 NOTE — Telephone Encounter (Signed)
Attempted to schedule.  LMOV to call office.  ° °

## 2021-08-06 ENCOUNTER — Other Ambulatory Visit: Payer: Self-pay

## 2021-08-06 MED ORDER — TICAGRELOR 60 MG PO TABS
ORAL_TABLET | Freq: Two times a day (BID) | ORAL | 0 refills | Status: DC
  Filled 2021-08-06: qty 180, 90d supply, fill #0

## 2021-08-15 ENCOUNTER — Other Ambulatory Visit (HOSPITAL_COMMUNITY): Payer: Self-pay

## 2021-08-23 ENCOUNTER — Other Ambulatory Visit (HOSPITAL_COMMUNITY): Payer: Self-pay

## 2021-09-17 ENCOUNTER — Other Ambulatory Visit (HOSPITAL_COMMUNITY): Payer: Self-pay

## 2021-09-18 NOTE — Progress Notes (Signed)
Cardiology Office Note  Date:  09/20/2021   ID:  Daniel Black, DOB 02-09-1961, MRN [id number]  PCP:  Leonel Ramsay, MD   Chief Complaint  Patient presents with   6 month follow up     "Doing well." Medications reviewed by the patient verbally.     HPI:  Daniel Black is a very pleasant 61 year old gentleman with  remote smoking history,  coronary artery disease,  MI in 2004 with PCI to the LAD and mid RCA at that time,  PCI in 2010 of the mid RCA lesion,  hyperlipidemia, HIV who is on antivirals  who presents for routine follow up of his coronary artery disease.  Doing well, Reports having some shortness of breath on exercise, chronic issue Concerned about PVCs when working out Picks up the PVCs on his watch Thinks it is causing SOB  Had prior cardiac catheterization March 2022 for similar symptoms Reports not going to the gym as much recently  Recent change to his HIV medications Reports side effects could be shortness of breath  Labs reviewed CR 1.5 Total chol 86  On Crestor Zetia  EKG personally reviewed by myself on todays visit Normal sinus rhythm rate 68 bpm old inferior MI, no change  Results reviewed Catheterization March 2022 Patent stents LAD, circumflex, RCA Mid to distal LAD with moderate disease, tapers significantly from proximal to mid region, small caliber vessel mid to distal Normal ejection fraction, no significant aortic valve stenosis No significant change compared to prior catheterization 2019, unable to exclude slight progression of mid LAD disease  STEMI 11/2017 Stent to the distal RCA and posterior AV groove segment Status post staged PCI to the LCx  11/19/2017., second stent placed for suspected edge dissection  Cardiac catheterization report from April 2010 details patent LAD stent, 30% distal LAD, 25% proximal left circumflex, 20% mid circumflex, 95% mid RCA, 40% distal RCA, 30% right PDA. Xience 3.5 x 28 mm DES stent placed to  the mid RCA.     PMH:   has a past medical history of Asymptomatic HIV infection ([place]), Bell's palsy (2004), CAD (coronary artery disease), DDD (degenerative disc disease) (10/02), Erectile dysfunction, HLD (hyperlipidemia), Myocardial infarction ([place]) (2004), Nephrolithiasis, and Skin cancer, basal cell.  PSH:    Past Surgical History:  Procedure Laterality Date   AMPUTATION  1985   L fingers reattachement    back fusion     lumbar   CARDIAC CATHETERIZATION     x2. 2004, 2010. 3 stents(total)   CHOLECYSTECTOMY  3/03   COLONOSCOPY WITH PROPOFOL N/A 06/13/2016   Procedure: COLONOSCOPY WITH PROPOFOL;  Surgeon: Lucilla Lame, MD;  Location: [place];  Service: Endoscopy;  Laterality: N/A;   COLONOSCOPY WITH PROPOFOL N/A 07/01/2021   Procedure: COLONOSCOPY WITH PROPOFOL;  Surgeon: Lucilla Lame, MD;  Location: [place];  Service: Endoscopy;  Laterality: N/A;   CORONARY/GRAFT ACUTE MI REVASCULARIZATION N/A 11/16/2017   Procedure: Coronary/Graft Acute MI Revascularization;  Surgeon: [name], MD;  Location: [place] CV LAB;  Service: Cardiovascular;  Laterality: N/A;   LEFT HEART CATH AND CORONARY ANGIOGRAPHY N/A 11/16/2017   Procedure: LEFT HEART CATH AND CORONARY ANGIOGRAPHY;  Surgeon: [name], MD;  Location: [place] CV LAB;  Service: Cardiovascular;  Laterality: N/A;   LEFT HEART CATH AND CORONARY ANGIOGRAPHY N/A 11/19/2017   Procedure: LEFT HEART CATH AND CORONARY ANGIOGRAPHY;  Surgeon: [name], MD;  Location: [place] CV LAB;  Service: Cardiovascular;  Laterality: N/A;  LEFT HEART CATH AND CORONARY ANGIOGRAPHY Left 06/14/2020   Procedure: LEFT HEART CATH AND CORONARY ANGIOGRAPHY;  Surgeon: [name], MD;  Location: [place] CV LAB;  Service: Cardiovascular;  Laterality: Left;   POLYPECTOMY  06/13/2016   Procedure: POLYPECTOMY;  Surgeon: Lucilla Lame, MD;  Location: [place];  Service: Endoscopy;;   POLYPECTOMY   07/01/2021   Procedure: POLYPECTOMY;  Surgeon: Lucilla Lame, MD;  Location: [place];  Service: Endoscopy;;   WRIST SURGERY  10/2011    Current Outpatient Medications  Medication Sig Dispense Refill   aspirin 81 MG chewable tablet Chew 1 tablet (81 mg total) by mouth daily.     dolutegravir-lamiVUDine (DOVATO) 50-300 MG tablet Take 1 tablet by mouth once daily 30 tablet 11   ezetimibe (ZETIA) 10 MG tablet TAKE 1 TABLET (10 MG TOTAL) BY MOUTH DAILY. 90 tablet 0   fluticasone (FLONASE) 50 MCG/ACT nasal spray Place 1 spray into both nostrils daily as needed for allergies or rhinitis.     isosorbide mononitrate (IMDUR) 30 MG 24 hr tablet TAKE 1 TABLET BY MOUTH DAILY. 90 tablet 3   nitroGLYCERIN (NITROSTAT) 0.4 MG SL tablet Place 1 tablet (0.4 mg total) under the tongue every 5 (five) minutes as needed for chest pain. 25 tablet 0   rosuvastatin (CRESTOR) 40 MG tablet TAKE 1 TABLET BY MOUTH DAILY. 90 tablet 0   ticagrelor (BRILINTA) 60 MG TABS tablet TAKE 1 TABLET BY MOUTH TWICE DAILY 180 tablet 0   acetaminophen (TYLENOL) 325 MG tablet Take 650 mg by mouth every 6 (six) hours as needed for moderate pain or headache. (Patient not taking: Reported on 09/20/2021)     amoxicillin-clavulanate (AUGMENTIN) 875-125 MG tablet Take 1 tablet by mouth every 12 (twelve) hours. (Patient not taking: Reported on 09/20/2021) 14 tablet 0   diphenhydramine-acetaminophen (TYLENOL PM) 25-500 MG TABS tablet Take 1 tablet by mouth at bedtime as needed (sleep). (Patient not taking: Reported on 09/20/2021)     No current facility-administered medications for this visit.     Allergies:   Patient has no known allergies.   Social History:  The patient  reports that he quit smoking about 11 years ago. His smoking use included cigarettes. He has never used smokeless tobacco. He reports that he does not drink alcohol and does not use drugs.   Family History:   family history includes Cancer in his father; Coronary  artery disease in his father and another family member; Hearing loss in his father and mother; Heart disease in his father and mother; Hypertension in his father and mother; Kidney disease in his father; Stroke in his father; Varicose Veins in his father; Vision loss in his father.   Review of Systems: Review of Systems  Constitutional: Negative.   HENT: Negative.    Respiratory:  Positive for shortness of breath.   Cardiovascular:  Positive for palpitations.  Gastrointestinal: Negative.   Musculoskeletal: Negative.   Neurological: Negative.   Psychiatric/Behavioral: Negative.    All other systems reviewed and are negative.   PHYSICAL EXAM: VS:  BP 100/60 (BP Location: Left Arm, Patient Position: Sitting, Cuff Size: Normal)   Pulse 68   Ht '5\' 10"'$  (1.778 m)   Wt 197 lb 2 oz (89.4 kg)   SpO2 98%   BMI 28.28 kg/m  , BMI Body mass index is 28.28 kg/m.  Constitutional:  oriented to person, place, and time. No distress.  HENT:  Head: Grossly normal Eyes:  no discharge. No scleral  icterus.  Neck: No JVD, no carotid bruits  Cardiovascular: Regular rate and rhythm, no murmurs appreciated Pulmonary/Chest: Clear to auscultation bilaterally, no wheezes or rails Abdominal: Soft.  no distension.  no tenderness.  Musculoskeletal: Normal range of motion Neurological:  normal muscle tone. Coordination normal. No atrophy Skin: Skin warm and dry Psychiatric: normal affect, pleasant   Recent Labs: No results found for requested labs within last 365 days.    Lipid Panel Lab Results  Component Value Date   CHOL 86 07/03/2020   HDL 49 07/03/2020   LDLCALC 28 07/03/2020   TRIG 43 07/03/2020      Wt Readings from Last 3 Encounters:  09/20/21 197 lb 2 oz (89.4 kg)  07/01/21 188 lb (85.3 kg)  06/27/21 190 lb (86.2 kg)     ASSESSMENT AND PLAN:  Atherosclerosis of native coronary artery without angina pectoris, unspecified whether native or transplanted heart -  cardiac  catheterization March 2022, patent stents, 50% mid LAD disease Shortness of breath on heavy exertion discussed in detail Repeat echocardiogram ordered as none in 4 years If symptoms get worse, stress test could be ordered  Shortness of breath Chronic issue, on and off with the exercise Zio monitor ordered to estimate PVC burden Echocardiogram pending Could consider Myoview/ischemic work-up  Mixed hyperlipidemia Continue Crestor and Zetia Cholesterol at goal  Human immunodeficiency virus (HIV) disease ([place]) Reports recent change to his medications/antivirals Managed by ID    Total encounter time more than 30 minutes  Greater than 50% was spent in counseling and coordination of care with the patient    No orders of the defined types were placed in this encounter.    Signed, Esmond Plants, M.D., Ph.D. 09/20/2021  [place], [place]

## 2021-09-20 ENCOUNTER — Encounter: Payer: Self-pay | Admitting: Cardiovascular Disease

## 2021-09-20 ENCOUNTER — Ambulatory Visit (INDEPENDENT_AMBULATORY_CARE_PROVIDER_SITE_OTHER): Payer: 59

## 2021-09-20 ENCOUNTER — Ambulatory Visit: Payer: 59 | Admitting: Cardiovascular Disease

## 2021-09-20 VITALS — BP 100/60 | HR 68 | Ht 70.0 in | Wt 197.1 lb

## 2021-09-20 DIAGNOSIS — I25118 Atherosclerotic heart disease of native coronary artery with other forms of angina pectoris: Secondary | ICD-10-CM

## 2021-09-20 DIAGNOSIS — E785 Hyperlipidemia, unspecified: Secondary | ICD-10-CM

## 2021-09-20 DIAGNOSIS — N183 Chronic kidney disease, stage 3 unspecified: Secondary | ICD-10-CM | POA: Diagnosis not present

## 2021-09-20 DIAGNOSIS — I493 Ventricular premature depolarization: Secondary | ICD-10-CM

## 2021-09-20 DIAGNOSIS — K219 Gastro-esophageal reflux disease without esophagitis: Secondary | ICD-10-CM

## 2021-09-20 DIAGNOSIS — R0602 Shortness of breath: Secondary | ICD-10-CM | POA: Diagnosis not present

## 2021-09-20 DIAGNOSIS — R079 Chest pain, unspecified: Secondary | ICD-10-CM | POA: Diagnosis not present

## 2021-09-20 NOTE — Patient Instructions (Addendum)
Medication Instructions:  No changes  If you need a refill on your cardiac medications before your next appointment, please call your pharmacy.   Lab work: No new labs needed  Testing/Procedures:  Your physician has requested that you have an echocardiogram. Echocardiography is a painless test that uses sound waves to create images of your heart. It provides your doctor with information about the size and shape of your heart and how well your heart's chambers and valves are working. This procedure takes approximately one hour. There are no restrictions for this procedure.   Your provider has ordered a heart monitor to wear for 14 days. This will be mailed to your home with instructions on placement. Once you have finished the time frame requested, you will return monitor in box provided.     Follow-Up: At [facility], you and your health needs are our priority.  As part of our continuing mission to provide you with exceptional heart care, we have created designated Provider Care Teams.  These Care Teams include your primary Cardiologist (physician) and Advanced Practice Providers (APPs -  Physician Assistants and Nurse Practitioners) who all work together to provide you with the care you need, when you need it.  You will need a follow up appointment in 12 months  Providers on your designated Care Team:   Murray Hodgkins, NP Christell Faith, PA-C Cadence Kathlen Mody, [place]  COVID-19 Vaccine Information can be found at: ShippingScam.co.uk For questions related to vaccine distribution or appointments, please email [email or web address] or call [phone].

## 2021-09-24 DIAGNOSIS — I493 Ventricular premature depolarization: Secondary | ICD-10-CM | POA: Diagnosis not present

## 2021-10-10 ENCOUNTER — Other Ambulatory Visit (HOSPITAL_COMMUNITY): Payer: Self-pay

## 2021-10-11 ENCOUNTER — Other Ambulatory Visit (HOSPITAL_COMMUNITY): Payer: Self-pay

## 2021-10-14 ENCOUNTER — Other Ambulatory Visit (HOSPITAL_COMMUNITY): Payer: Self-pay

## 2021-10-14 DIAGNOSIS — I493 Ventricular premature depolarization: Secondary | ICD-10-CM | POA: Diagnosis not present

## 2021-10-23 ENCOUNTER — Ambulatory Visit (INDEPENDENT_AMBULATORY_CARE_PROVIDER_SITE_OTHER): Payer: 59

## 2021-10-23 DIAGNOSIS — I25118 Atherosclerotic heart disease of native coronary artery with other forms of angina pectoris: Secondary | ICD-10-CM

## 2021-10-23 DIAGNOSIS — R0602 Shortness of breath: Secondary | ICD-10-CM

## 2021-10-23 LAB — ECHOCARDIOGRAM COMPLETE
AR max vel: 2.76 cm2
AV Area VTI: 2.38 cm2
AV Area mean vel: 2.3 cm2
AV Mean grad: 5 mmHg
AV Peak grad: 7.8 mmHg
Ao pk vel: 1.4 m/s
Area-P 1/2: 3.28 cm2
Calc EF: 58.6 %
S' Lateral: 3.5 cm
Single Plane A2C EF: 58.3 %
Single Plane A4C EF: 56.1 %

## 2021-10-27 ENCOUNTER — Other Ambulatory Visit: Payer: Self-pay | Admitting: Cardiovascular Disease

## 2021-10-28 ENCOUNTER — Other Ambulatory Visit: Payer: Self-pay

## 2021-10-28 MED ORDER — TICAGRELOR 60 MG PO TABS
ORAL_TABLET | Freq: Two times a day (BID) | ORAL | 2 refills | Status: AC
Start: 2021-10-28 — End: 2022-10-28
  Filled 2021-10-28: qty 180, 90d supply, fill #0
  Filled 2022-01-28: qty 180, 90d supply, fill #1

## 2021-10-28 MED ORDER — EZETIMIBE 10 MG PO TABS
ORAL_TABLET | Freq: Every day | ORAL | 2 refills | Status: DC
  Filled 2021-10-28: qty 90, 90d supply, fill #0
  Filled 2022-01-28 (×2): qty 90, 90d supply, fill #1
  Filled 2022-05-01: qty 90, 90d supply, fill #2

## 2021-10-29 ENCOUNTER — Other Ambulatory Visit: Payer: Self-pay

## 2021-11-06 ENCOUNTER — Other Ambulatory Visit (HOSPITAL_COMMUNITY): Payer: Self-pay

## 2021-11-14 ENCOUNTER — Other Ambulatory Visit (HOSPITAL_COMMUNITY): Payer: Self-pay

## 2021-11-26 DIAGNOSIS — I2510 Atherosclerotic heart disease of native coronary artery without angina pectoris: Secondary | ICD-10-CM | POA: Diagnosis not present

## 2021-11-26 DIAGNOSIS — E782 Mixed hyperlipidemia: Secondary | ICD-10-CM | POA: Diagnosis not present

## 2021-11-26 DIAGNOSIS — Z1212 Encounter for screening for malignant neoplasm of rectum: Secondary | ICD-10-CM | POA: Diagnosis not present

## 2021-11-26 DIAGNOSIS — Z1211 Encounter for screening for malignant neoplasm of colon: Secondary | ICD-10-CM | POA: Diagnosis not present

## 2021-11-26 DIAGNOSIS — Z0000 Encounter for general adult medical examination without abnormal findings: Secondary | ICD-10-CM | POA: Diagnosis not present

## 2021-11-26 DIAGNOSIS — Z21 Asymptomatic human immunodeficiency virus [HIV] infection status: Secondary | ICD-10-CM | POA: Diagnosis not present

## 2021-11-26 DIAGNOSIS — Z125 Encounter for screening for malignant neoplasm of prostate: Secondary | ICD-10-CM | POA: Diagnosis not present

## 2021-11-27 DIAGNOSIS — C44311 Basal cell carcinoma of skin of nose: Secondary | ICD-10-CM | POA: Diagnosis not present

## 2021-11-27 DIAGNOSIS — D485 Neoplasm of uncertain behavior of skin: Secondary | ICD-10-CM | POA: Diagnosis not present

## 2021-12-02 DIAGNOSIS — E782 Mixed hyperlipidemia: Secondary | ICD-10-CM | POA: Diagnosis not present

## 2021-12-02 DIAGNOSIS — Z21 Asymptomatic human immunodeficiency virus [HIV] infection status: Secondary | ICD-10-CM | POA: Diagnosis not present

## 2021-12-02 DIAGNOSIS — N1831 Chronic kidney disease, stage 3a: Secondary | ICD-10-CM | POA: Diagnosis not present

## 2021-12-02 DIAGNOSIS — M722 Plantar fascial fibromatosis: Secondary | ICD-10-CM | POA: Diagnosis not present

## 2021-12-02 DIAGNOSIS — I2510 Atherosclerotic heart disease of native coronary artery without angina pectoris: Secondary | ICD-10-CM | POA: Diagnosis not present

## 2021-12-10 ENCOUNTER — Other Ambulatory Visit (HOSPITAL_COMMUNITY): Payer: Self-pay

## 2021-12-12 ENCOUNTER — Other Ambulatory Visit (HOSPITAL_COMMUNITY): Payer: Self-pay

## 2021-12-20 DIAGNOSIS — C44311 Basal cell carcinoma of skin of nose: Secondary | ICD-10-CM | POA: Diagnosis not present

## 2021-12-25 DIAGNOSIS — M722 Plantar fascial fibromatosis: Secondary | ICD-10-CM | POA: Diagnosis not present

## 2021-12-25 DIAGNOSIS — M79671 Pain in right foot: Secondary | ICD-10-CM | POA: Diagnosis not present

## 2021-12-25 DIAGNOSIS — M7731 Calcaneal spur, right foot: Secondary | ICD-10-CM | POA: Diagnosis not present

## 2022-01-02 DIAGNOSIS — Z21 Asymptomatic human immunodeficiency virus [HIV] infection status: Secondary | ICD-10-CM | POA: Diagnosis not present

## 2022-01-10 ENCOUNTER — Other Ambulatory Visit (HOSPITAL_COMMUNITY): Payer: Self-pay

## 2022-01-14 ENCOUNTER — Telehealth: Payer: Self-pay | Admitting: Cardiovascular Disease

## 2022-01-14 ENCOUNTER — Encounter: Payer: Self-pay | Admitting: *Deleted

## 2022-01-14 ENCOUNTER — Other Ambulatory Visit (HOSPITAL_COMMUNITY): Payer: Self-pay

## 2022-01-14 NOTE — Telephone Encounter (Signed)
Spoke with patient and he needs note stating that he can no longer perform CPR. Advised I would send over to provider and would give him a call back when that note is ready for pick up.

## 2022-01-14 NOTE — Telephone Encounter (Signed)
Patient is requesting a note stating he can no longer perform CPR

## 2022-01-16 NOTE — Telephone Encounter (Signed)
Notified patient that letter has been completed and available for pick up. No further needs.

## 2022-01-17 DIAGNOSIS — M7751 Other enthesopathy of right foot: Secondary | ICD-10-CM | POA: Diagnosis not present

## 2022-01-17 DIAGNOSIS — M722 Plantar fascial fibromatosis: Secondary | ICD-10-CM | POA: Diagnosis not present

## 2022-01-28 ENCOUNTER — Other Ambulatory Visit: Payer: Self-pay

## 2022-01-28 ENCOUNTER — Other Ambulatory Visit: Payer: Self-pay | Admitting: Cardiovascular Disease

## 2022-01-28 MED ORDER — ROSUVASTATIN CALCIUM 40 MG PO TABS
ORAL_TABLET | Freq: Every day | ORAL | 1 refills | Status: DC
  Filled 2022-01-28: qty 90, 90d supply, fill #0
  Filled 2022-05-01: qty 90, 90d supply, fill #1

## 2022-01-29 ENCOUNTER — Other Ambulatory Visit: Payer: Self-pay

## 2022-01-29 DIAGNOSIS — L821 Other seborrheic keratosis: Secondary | ICD-10-CM | POA: Diagnosis not present

## 2022-01-29 DIAGNOSIS — Z85828 Personal history of other malignant neoplasm of skin: Secondary | ICD-10-CM | POA: Diagnosis not present

## 2022-01-29 DIAGNOSIS — L578 Other skin changes due to chronic exposure to nonionizing radiation: Secondary | ICD-10-CM | POA: Diagnosis not present

## 2022-01-29 DIAGNOSIS — C44612 Basal cell carcinoma of skin of right upper limb, including shoulder: Secondary | ICD-10-CM | POA: Diagnosis not present

## 2022-01-29 DIAGNOSIS — D485 Neoplasm of uncertain behavior of skin: Secondary | ICD-10-CM | POA: Diagnosis not present

## 2022-01-29 DIAGNOSIS — Z86018 Personal history of other benign neoplasm: Secondary | ICD-10-CM | POA: Diagnosis not present

## 2022-01-29 DIAGNOSIS — C44519 Basal cell carcinoma of skin of other part of trunk: Secondary | ICD-10-CM | POA: Diagnosis not present

## 2022-01-29 DIAGNOSIS — Z09 Encounter for follow-up examination after completed treatment for conditions other than malignant neoplasm: Secondary | ICD-10-CM | POA: Diagnosis not present

## 2022-01-29 DIAGNOSIS — R21 Rash and other nonspecific skin eruption: Secondary | ICD-10-CM | POA: Diagnosis not present

## 2022-01-29 DIAGNOSIS — C44712 Basal cell carcinoma of skin of right lower limb, including hip: Secondary | ICD-10-CM | POA: Diagnosis not present

## 2022-01-29 DIAGNOSIS — D225 Melanocytic nevi of trunk: Secondary | ICD-10-CM | POA: Diagnosis not present

## 2022-02-04 ENCOUNTER — Other Ambulatory Visit (HOSPITAL_COMMUNITY): Payer: Self-pay

## 2022-02-10 ENCOUNTER — Other Ambulatory Visit (HOSPITAL_COMMUNITY): Payer: Self-pay

## 2022-02-13 DIAGNOSIS — M722 Plantar fascial fibromatosis: Secondary | ICD-10-CM | POA: Diagnosis not present

## 2022-02-13 DIAGNOSIS — M7751 Other enthesopathy of right foot: Secondary | ICD-10-CM | POA: Diagnosis not present

## 2022-03-03 ENCOUNTER — Other Ambulatory Visit (HOSPITAL_COMMUNITY): Payer: Self-pay

## 2022-03-05 ENCOUNTER — Other Ambulatory Visit (HOSPITAL_COMMUNITY): Payer: Self-pay

## 2022-03-06 ENCOUNTER — Other Ambulatory Visit (HOSPITAL_COMMUNITY): Payer: Self-pay

## 2022-03-26 DIAGNOSIS — Z21 Asymptomatic human immunodeficiency virus [HIV] infection status: Secondary | ICD-10-CM | POA: Diagnosis not present

## 2022-04-03 ENCOUNTER — Other Ambulatory Visit (HOSPITAL_COMMUNITY): Payer: Self-pay

## 2022-04-08 ENCOUNTER — Other Ambulatory Visit (HOSPITAL_COMMUNITY): Payer: Self-pay

## 2022-05-01 ENCOUNTER — Other Ambulatory Visit (HOSPITAL_COMMUNITY): Payer: Self-pay

## 2022-05-01 ENCOUNTER — Other Ambulatory Visit: Payer: Self-pay | Admitting: Cardiovascular Disease

## 2022-05-01 ENCOUNTER — Other Ambulatory Visit: Payer: Self-pay

## 2022-05-01 MED FILL — Isosorbide Mononitrate Tab ER 24HR 30 MG: ORAL | 90 days supply | Qty: 90 | Fill #0 | Status: AC

## 2022-05-02 ENCOUNTER — Other Ambulatory Visit (HOSPITAL_COMMUNITY): Payer: Self-pay

## 2022-05-21 DIAGNOSIS — M722 Plantar fascial fibromatosis: Secondary | ICD-10-CM | POA: Diagnosis not present

## 2022-05-21 DIAGNOSIS — M7751 Other enthesopathy of right foot: Secondary | ICD-10-CM | POA: Diagnosis not present

## 2022-05-22 ENCOUNTER — Other Ambulatory Visit (HOSPITAL_COMMUNITY): Payer: Self-pay

## 2022-05-28 DIAGNOSIS — E782 Mixed hyperlipidemia: Secondary | ICD-10-CM | POA: Diagnosis not present

## 2022-05-28 DIAGNOSIS — Z21 Asymptomatic human immunodeficiency virus [HIV] infection status: Secondary | ICD-10-CM | POA: Diagnosis not present

## 2022-05-28 DIAGNOSIS — I2510 Atherosclerotic heart disease of native coronary artery without angina pectoris: Secondary | ICD-10-CM | POA: Diagnosis not present

## 2022-05-30 ENCOUNTER — Other Ambulatory Visit (HOSPITAL_COMMUNITY): Payer: Self-pay

## 2022-06-02 ENCOUNTER — Other Ambulatory Visit (HOSPITAL_COMMUNITY): Payer: Self-pay

## 2022-06-04 ENCOUNTER — Other Ambulatory Visit: Payer: Self-pay

## 2022-06-04 ENCOUNTER — Other Ambulatory Visit (HOSPITAL_COMMUNITY): Payer: Self-pay

## 2022-06-04 DIAGNOSIS — I2510 Atherosclerotic heart disease of native coronary artery without angina pectoris: Secondary | ICD-10-CM | POA: Diagnosis not present

## 2022-06-04 DIAGNOSIS — Z21 Asymptomatic human immunodeficiency virus [HIV] infection status: Secondary | ICD-10-CM | POA: Diagnosis not present

## 2022-06-04 DIAGNOSIS — Z0000 Encounter for general adult medical examination without abnormal findings: Secondary | ICD-10-CM | POA: Diagnosis not present

## 2022-06-04 DIAGNOSIS — M722 Plantar fascial fibromatosis: Secondary | ICD-10-CM | POA: Diagnosis not present

## 2022-06-04 DIAGNOSIS — E782 Mixed hyperlipidemia: Secondary | ICD-10-CM | POA: Diagnosis not present

## 2022-06-04 DIAGNOSIS — N1831 Chronic kidney disease, stage 3a: Secondary | ICD-10-CM | POA: Diagnosis not present

## 2022-06-04 MED ORDER — BIKTARVY 50-200-25 MG PO TABS
1.0000 | ORAL_TABLET | Freq: Every day | ORAL | 11 refills | Status: DC
  Filled 2022-06-04: qty 30, 30d supply, fill #0

## 2022-06-05 ENCOUNTER — Ambulatory Visit (INDEPENDENT_AMBULATORY_CARE_PROVIDER_SITE_OTHER): Payer: Commercial Managed Care - PPO | Admitting: Pharmacist

## 2022-06-05 ENCOUNTER — Other Ambulatory Visit: Payer: Self-pay

## 2022-06-05 ENCOUNTER — Other Ambulatory Visit (HOSPITAL_COMMUNITY): Payer: Self-pay

## 2022-06-05 ENCOUNTER — Telehealth: Payer: Self-pay | Admitting: Pharmacist

## 2022-06-05 DIAGNOSIS — M8588 Other specified disorders of bone density and structure, other site: Secondary | ICD-10-CM | POA: Diagnosis not present

## 2022-06-05 DIAGNOSIS — B20 Human immunodeficiency virus [HIV] disease: Secondary | ICD-10-CM

## 2022-06-05 MED ORDER — BIKTARVY 50-200-25 MG PO TABS
1.0000 | ORAL_TABLET | Freq: Every day | ORAL | 11 refills | Status: DC
  Filled 2022-06-05: qty 30, 30d supply, fill #0
  Filled 2022-06-26: qty 30, 30d supply, fill #1
  Filled 2022-07-25: qty 30, 30d supply, fill #2
  Filled 2022-08-22: qty 30, 30d supply, fill #3
  Filled 2022-09-18: qty 30, 30d supply, fill #4
  Filled 2022-10-21: qty 30, 30d supply, fill #5
  Filled 2022-11-21: qty 30, 30d supply, fill #6
  Filled 2022-12-15: qty 30, 30d supply, fill #7
  Filled 2023-01-16: qty 30, 30d supply, fill #8
  Filled 2023-02-20: qty 30, 30d supply, fill #9
  Filled 2023-03-24 – 2023-03-25 (×2): qty 30, 30d supply, fill #10
  Filled 2023-04-22: qty 30, 30d supply, fill #11

## 2022-06-05 NOTE — Telephone Encounter (Signed)
Called patient to discuss HIV medication, Dovato for [employer]. No answer, left HIPAA compliant VM.  Amri Lien L. Cherlynn Popiel, PharmD, BCIDP, AAHIVP, CPP Clinical Pharmacist Practitioner Infectious Diseases [facility] 06/05/2022, 3:08 PM

## 2022-06-05 NOTE — Progress Notes (Signed)
Virtual Visit via Telephone Note  I connected with Daniel Black on 06/05/22 at  3:15 PM EST by telephone and verified that I am speaking with the correct person using two identifiers.  Location: Patient: Home Provider: Office   I discussed the limitations, risks, security and privacy concerns of performing an evaluation and management service by telephone and the availability of in person appointments. I also discussed with the patient that there may be a patient responsible charge related to this service. The patient expressed understanding and agreed to proceed.  HPI: Daniel Black is a 62 y.o. male who presents for follow-up of their long-term specialty medication, Biktarvy.  Patient Active Problem List   Diagnosis Date Noted   History of colonic polyps    Polyp of transverse colon    Unstable angina ([place]) 06/14/2020   Acute ST elevation myocardial infarction (STEMI) of inferior wall ([place]) 11/16/2017   CAD in native artery 11/16/2017   AKI (acute kidney injury) ([place]) 11/16/2017   Special screening for malignant neoplasms, colon    Benign neoplasm of ascending colon    Human immunodeficiency virus (HIV) disease ([place]) 11/25/2009   Hyperlipidemia 11/23/2009   Coronary atherosclerosis 11/23/2009   Chest pain 11/23/2009    Patient's Medications  New Prescriptions   No medications on file  Previous Medications   ACETAMINOPHEN (TYLENOL) 325 MG TABLET    Take 650 mg by mouth every 6 (six) hours as needed for moderate pain or headache.   AMOXICILLIN-CLAVULANATE (AUGMENTIN) 875-125 MG TABLET    Take 1 tablet by mouth every 12 (twelve) hours.   ASPIRIN 81 MG CHEWABLE TABLET    Chew 1 tablet (81 mg total) by mouth daily.   DIPHENHYDRAMINE-ACETAMINOPHEN (TYLENOL PM) 25-500 MG TABS TABLET    Take 1 tablet by mouth at bedtime as needed (sleep).   EZETIMIBE (ZETIA) 10 MG TABLET    TAKE 1 TABLET (10 MG TOTAL) BY MOUTH DAILY.   FLUTICASONE (FLONASE) 50 MCG/ACT NASAL SPRAY    Place 1  spray into both nostrils daily as needed for allergies or rhinitis.   ISOSORBIDE MONONITRATE (IMDUR) 30 MG 24 HR TABLET    Take 1 tablet (30 mg total) by mouth daily.   NITROGLYCERIN (NITROSTAT) 0.4 MG SL TABLET    Place 1 tablet (0.4 mg total) under the tongue every 5 (five) minutes as needed for chest pain.   ROSUVASTATIN (CRESTOR) 40 MG TABLET    TAKE 1 TABLET BY MOUTH DAILY.   TICAGRELOR (BRILINTA) 60 MG TABS TABLET    TAKE 1 TABLET BY MOUTH TWICE DAILY  Modified Medications   Modified Medication Previous Medication   BICTEGRAVIR-EMTRICITABINE-TENOFOVIR AF (BIKTARVY) 50-200-25 MG TABS TABLET bictegravir-emtricitabine-tenofovir AF (BIKTARVY) 50-200-25 MG TABS tablet      Take 1 tablet by mouth once daily    Take 1 tablet by mouth once daily  Discontinued Medications   DOLUTEGRAVIR-LAMIVUDINE (DOVATO) 50-300 MG TABLET    Take 1 tablet by mouth once daily    Allergies: No Known Allergies  Past Medical History: Past Medical History:  Diagnosis Date   Asymptomatic HIV infection ([place])    Bell's palsy 2004   CAD (coronary artery disease)    a. MI in 2004 w/ PCI to LAD and mRCA; b. LHC 07/2008: patent LAD stent, 30% distal LAD stenosis, 25% proximal LCx stenosis, 20% mid LCx stenosis, 95% mid RCA stenosis s/p PCI/DES (Xience 3.5 x 28 mm), and 40% distal RCA stenosis   DDD (degenerative disc disease) 10/02  has lumbar fusion   Erectile dysfunction    HLD (hyperlipidemia)    mixed   Myocardial infarction ([place]) 2004   x 3- last was July 2019 has 6 stents   Nephrolithiasis    stage 3   Skin cancer, basal cell    skin    Social History: Social History   Socioeconomic History   Marital status: Soil scientist    Spouse name: Not on file   Number of children: Not on file   Years of education: Not on file   Highest education level: Not on file  Occupational History   Occupation: Orderly/CNA    Employer: [employer]: Full time  Tobacco Use   Smoking status: Former    Types:  Cigarettes    Quit date: 04/07/2010    Years since quitting: 12.1   Smokeless tobacco: Never  Vaping Use   Vaping Use: Never used  Substance and Sexual Activity   Alcohol use: No    Comment: occasional (1x/mo)   Drug use: No   Sexual activity: Not on file  Other Topics Concern   Not on file  Social History Narrative   Single - domestic partner.    Full time - orderly/ CNA   Regular exercise - ride, walk, run             Social Determinants of Health   Financial Resource Strain: Not on file  Food Insecurity: Not on file  Transportation Needs: Not on file  Physical Activity: Not on file  Stress: Not on file  Social Connections: Not on file    Labs: Lab Results  Component Value Date   HIV1RNAQUANT 30 05/19/2019   HIV1RNAQUANT 50 11/11/2018   HIV1RNAQUANT <20 05/13/2018    RPR and STI Lab Results  Component Value Date   LABRPR NON REACTIVE 05/19/2019   LABRPR Non Reactive 05/13/2018   LABRPR Non Reactive 04/20/2015        No data to display          Hepatitis B Lab Results  Component Value Date   HEPBSAG Negative 05/13/2018   Hepatitis C No results found for: "HEPCAB", "HCVRNAPCRQN" Hepatitis A No results found for: "HAV" Lipids: Lab Results  Component Value Date   CHOL 86 07/03/2020   TRIG 43 07/03/2020   HDL 49 07/03/2020   CHOLHDL 1.8 07/03/2020   VLDL 9 07/03/2020   LDLCALC 28 07/03/2020    Assessment: I spoke with Daniel Black today regarding their specialty medication, Biktarvy. Patient takes it every day without any issues or missed doses. No problems with adverse effects or tolerability. No problems getting it from [facility]. Updated/reviewed medication list - no drug interactions. All questions answered. Will follow up in 1 year.  Plan: - Continue Biktarvy - Follow up in 1 year   I discussed the assessment and treatment plan with the patient. The patient was provided an opportunity to ask questions and all were answered.  The patient agreed with the plan and demonstrated an understanding of the instructions.   The patient was advised to call back or seek an in-person evaluation if the symptoms worsen or if the condition fails to improve as anticipated.  I provided 10 minutes of non-face-to-face time during this encounter.  Prentice Sackrider L. Eber Hong, PharmD, BCIDP, AAHIVP, CPP Clinical Pharmacist Practitioner Infectious Diseases [facility] 06/05/2022, 3:29 PM .

## 2022-06-10 ENCOUNTER — Other Ambulatory Visit: Payer: Self-pay

## 2022-06-26 ENCOUNTER — Other Ambulatory Visit (HOSPITAL_COMMUNITY): Payer: Self-pay

## 2022-07-02 ENCOUNTER — Other Ambulatory Visit (HOSPITAL_COMMUNITY): Payer: Self-pay

## 2022-07-02 ENCOUNTER — Other Ambulatory Visit: Payer: Self-pay

## 2022-07-03 ENCOUNTER — Ambulatory Visit: Payer: Commercial Managed Care - PPO | Admitting: Nurse Practitioner

## 2022-07-08 ENCOUNTER — Other Ambulatory Visit: Payer: Self-pay

## 2022-07-08 DIAGNOSIS — Z981 Arthrodesis status: Secondary | ICD-10-CM | POA: Diagnosis not present

## 2022-07-08 DIAGNOSIS — G8929 Other chronic pain: Secondary | ICD-10-CM | POA: Diagnosis not present

## 2022-07-08 DIAGNOSIS — M545 Low back pain, unspecified: Secondary | ICD-10-CM | POA: Diagnosis not present

## 2022-07-08 MED ORDER — PREDNISONE 20 MG PO TABS
ORAL_TABLET | ORAL | 0 refills | Status: AC
  Filled 2022-07-08: qty 18, 9d supply, fill #0

## 2022-07-08 MED ORDER — CYCLOBENZAPRINE HCL 5 MG PO TABS
5.0000 mg | ORAL_TABLET | Freq: Three times a day (TID) | ORAL | 0 refills | Status: DC | PRN
  Filled 2022-07-08: qty 30, 10d supply, fill #0

## 2022-07-25 ENCOUNTER — Other Ambulatory Visit (HOSPITAL_COMMUNITY): Payer: Self-pay

## 2022-07-29 ENCOUNTER — Other Ambulatory Visit (HOSPITAL_COMMUNITY): Payer: Self-pay

## 2022-08-05 ENCOUNTER — Other Ambulatory Visit: Payer: Self-pay

## 2022-08-05 ENCOUNTER — Other Ambulatory Visit: Payer: Self-pay | Admitting: Cardiovascular Disease

## 2022-08-05 MED ORDER — EZETIMIBE 10 MG PO TABS
10.0000 mg | ORAL_TABLET | Freq: Every day | ORAL | 0 refills | Status: DC
  Filled 2022-08-05: qty 90, 90d supply, fill #0

## 2022-08-05 MED ORDER — CYCLOBENZAPRINE HCL 5 MG PO TABS
5.0000 mg | ORAL_TABLET | Freq: Three times a day (TID) | ORAL | 0 refills | Status: DC | PRN
  Filled 2022-08-05: qty 30, 10d supply, fill #0

## 2022-08-05 MED ORDER — ROSUVASTATIN CALCIUM 40 MG PO TABS
40.0000 mg | ORAL_TABLET | Freq: Every day | ORAL | 0 refills | Status: DC
  Filled 2022-08-05: qty 90, 90d supply, fill #0

## 2022-08-05 MED FILL — Isosorbide Mononitrate Tab ER 24HR 30 MG: ORAL | 90 days supply | Qty: 90 | Fill #1 | Status: AC

## 2022-08-05 NOTE — Telephone Encounter (Signed)
last visit 09/20/21--You will need a follow up appointment in 12 months  next visit not scheduled

## 2022-08-08 ENCOUNTER — Other Ambulatory Visit: Payer: Self-pay

## 2022-08-08 MED ORDER — PREDNISONE 20 MG PO TABS
ORAL_TABLET | ORAL | 0 refills | Status: AC
  Filled 2022-08-08: qty 12, 6d supply, fill #0

## 2022-08-15 ENCOUNTER — Encounter: Payer: Self-pay | Admitting: Emergency Medicine

## 2022-08-15 ENCOUNTER — Ambulatory Visit
Admission: EM | Admit: 2022-08-15 | Discharge: 2022-08-15 | Disposition: A | Discharge status: Home / Self Care | Payer: Commercial Managed Care - PPO | Attending: Physician Assistant | Admitting: Physician Assistant

## 2022-08-15 DIAGNOSIS — R509 Fever, unspecified: Secondary | ICD-10-CM

## 2022-08-15 DIAGNOSIS — R197 Diarrhea, unspecified: Secondary | ICD-10-CM

## 2022-08-15 DIAGNOSIS — R1084 Generalized abdominal pain: Secondary | ICD-10-CM

## 2022-08-15 MED ORDER — DICYCLOMINE HCL 20 MG PO TABS: 20.0000 mg | ORAL_TABLET | Freq: Three times a day (TID) | ORAL | 0 refills | Status: AC

## 2022-08-15 MED ORDER — AZITHROMYCIN 500 MG PO TABS: ORAL_TABLET | ORAL | 0 refills | Status: DC

## 2022-08-15 NOTE — ED Provider Notes (Signed)
[facility]    CSN: [id number] Arrival date & time: 08/15/22  1130      History   Chief Complaint Chief Complaint  Patient presents with   Diarrhea    HPI Daniel Black is a 62 y.o. male presenting for 2-day history of fever up to 101 degrees with abdominal cramping, nausea and multiple episodes of diarrhea per day.  He says his temperature 100.3 this morning.  He has been taking Tylenol.  Current temp 98 degrees.  He reports body aches.  States he has not been around any sick contacts.  Denies potentially food poisoning.  States he does feel little better today than he did yesterday.  He has been increasing his fluid intake today and trying to rest.  He says he was diagnosed with Salmonella which she picked up from his chickens last year.  He says his current symptoms feel very similar to that he is concerned.  He does have a history of heart disease and HIV.  Taking Tylenol and Kaopectate.  HPI  Past Medical History:  Diagnosis Date   Asymptomatic HIV infection ([facility])    Bell's palsy 2004   CAD (coronary artery disease)    a. MI in 2004 w/ PCI to LAD and mRCA; b. LHC 07/2008: patent LAD stent, 30% distal LAD stenosis, 25% proximal LCx stenosis, 20% mid LCx stenosis, 95% mid RCA stenosis s/p PCI/DES (Xience 3.5 x 28 mm), and 40% distal RCA stenosis   DDD (degenerative disc disease) 10/02   has lumbar fusion   Erectile dysfunction    HLD (hyperlipidemia)    mixed   Myocardial infarction ([facility]) 2004   x 3- last was July 2019 has 6 stents   Nephrolithiasis    stage 3   Skin cancer, basal cell    skin    Patient Active Problem List   Diagnosis Date Noted   History of colonic polyps    Polyp of transverse colon    Unstable angina ([facility]) 06/14/2020   Acute ST elevation myocardial infarction (STEMI) of inferior wall ([facility]) 11/16/2017   CAD in native artery 11/16/2017   AKI (acute kidney injury) ([facility]) 11/16/2017   Special screening for malignant neoplasms, colon     Benign neoplasm of ascending colon    Human immunodeficiency virus (HIV) disease ([facility]) 11/25/2009   Hyperlipidemia 11/23/2009   Coronary atherosclerosis 11/23/2009   Chest pain 11/23/2009    Past Surgical History:  Procedure Laterality Date   AMPUTATION  1985   L fingers reattachement    back fusion     lumbar   CARDIAC CATHETERIZATION     x2. 2004, 2010. 3 stents(total)   CHOLECYSTECTOMY  3/03   COLONOSCOPY WITH PROPOFOL N/A 06/13/2016   Procedure: COLONOSCOPY WITH PROPOFOL;  Surgeon: Midge Minium, MD;  Location: [facility];  Service: Endoscopy;  Laterality: N/A;   COLONOSCOPY WITH PROPOFOL N/A 07/01/2021   Procedure: COLONOSCOPY WITH PROPOFOL;  Surgeon: Midge Minium, MD;  Location: [facility];  Service: Endoscopy;  Laterality: N/A;   CORONARY/GRAFT ACUTE MI REVASCULARIZATION N/A 11/16/2017   Procedure: Coronary/Graft Acute MI Revascularization;  Surgeon: Iran Ouch, MD;  Location: [facility];  Service: Cardiovascular;  Laterality: N/A;   LEFT HEART CATH AND CORONARY ANGIOGRAPHY N/A 11/16/2017   Procedure: LEFT HEART CATH AND CORONARY ANGIOGRAPHY;  Surgeon: Iran Ouch, MD;  Location: [facility];  Service: Cardiovascular;  Laterality: N/A;   LEFT HEART CATH AND CORONARY ANGIOGRAPHY N/A 11/19/2017  Procedure: LEFT HEART CATH AND CORONARY ANGIOGRAPHY;  Surgeon: Iran Ouch, MD;  Location: [facility];  Service: Cardiovascular;  Laterality: N/A;   LEFT HEART CATH AND CORONARY ANGIOGRAPHY Left 06/14/2020   Procedure: LEFT HEART CATH AND CORONARY ANGIOGRAPHY;  Surgeon: Antonieta Iba, MD;  Location: [facility];  Service: Cardiovascular;  Laterality: Left;   POLYPECTOMY  06/13/2016   Procedure: POLYPECTOMY;  Surgeon: Midge Minium, MD;  Location: [facility];  Service: Endoscopy;;   POLYPECTOMY  07/01/2021   Procedure: POLYPECTOMY;  Surgeon: Midge Minium, MD;  Location: [facility];  Service: Endoscopy;;    WRIST SURGERY  10/2011       Home Medications    Prior to Admission medications   Medication Sig Start Date End Date Taking? Authorizing Provider  azithromycin (ZITHROMAX) 500 MG tablet Take 2 tabs PO daily on day 1 and then 1 tab PO daily x 5 days for infectious diarrhea. 08/15/22  Yes Shirlee Latch, PA-C  dicyclomine (BENTYL) 20 MG tablet Take 1 tablet (20 mg total) by mouth 3 (three) times daily before meals. 08/15/22  Yes Shirlee Latch, PA-C  acetaminophen (TYLENOL) 325 MG tablet Take 650 mg by mouth every 6 (six) hours as needed for moderate pain or headache. Patient not taking: Reported on 09/20/2021    [provider]  aspirin 81 MG chewable tablet Chew 1 tablet (81 mg total) by mouth daily. 11/20/17   Auburn Bilberry, MD  bictegravir-emtricitabine-tenofovir AF (BIKTARVY) 50-200-25 MG TABS tablet Take 1 tablet by mouth once daily 06/05/22   Kuppelweiser, Cassie L, RPH-CPP  cyclobenzaprine (FLEXERIL) 5 MG tablet Take 1 tablet (5 mg total) by mouth 3 (three) times daily as needed. 08/05/22     diphenhydramine-acetaminophen (TYLENOL PM) 25-500 MG TABS tablet Take 1 tablet by mouth at bedtime as needed (sleep). Patient not taking: Reported on 09/20/2021    [provider]  ezetimibe (ZETIA) 10 MG tablet Take 1 tablet (10 mg total) by mouth daily. Need visit prior to next refill 08/05/22 08/05/23  Antonieta Iba, MD  fluticasone [facility]) 50 MCG/ACT nasal spray Place 1 spray into both nostrils daily as needed for allergies or rhinitis.    [provider]  isosorbide mononitrate (IMDUR) 30 MG 24 hr tablet Take 1 tablet (30 mg total) by mouth daily. 05/01/22   Antonieta Iba, MD  nitroGLYCERIN (NITROSTAT) 0.4 MG SL tablet Place 1 tablet (0.4 mg total) under the tongue every 5 (five) minutes as needed for chest pain. 01/23/21 09/20/21  Antonieta Iba, MD  rosuvastatin (CRESTOR) 40 MG tablet Take 1 tablet (40 mg total) by mouth daily. Need visit prior to next  refill 08/05/22 08/05/23  Antonieta Iba, MD  ticagrelor (BRILINTA) 60 MG TABS tablet TAKE 1 TABLET BY MOUTH TWICE DAILY 10/28/21 10/28/22  Antonieta Iba, MD    Family History Family History  Problem Relation Age of Onset   Heart disease Father    Kidney disease Father    Cancer Father    Hearing loss Father    Hypertension Father    Stroke Father    Vision loss Father    Coronary artery disease Father    Varicose Veins Father    Hypertension Mother    Hearing loss Mother    Heart disease Mother    Coronary artery disease Other        family hx    Social History Social History   Tobacco Use  Smoking status: Former    Types: Cigarettes    Quit date: 04/07/2010    Years since quitting: 12.3   Smokeless tobacco: Never  Vaping Use   Vaping Use: Never used  Substance Use Topics   Alcohol use: No    Comment: occasional (1x/mo)   Drug use: No     Allergies   Patient has no known allergies.   Review of Systems Review of Systems  Constitutional:  Positive for chills, fatigue and fever.  HENT:  Negative for congestion, rhinorrhea, sinus pressure, sinus pain and sore throat.   Respiratory:  Negative for cough and shortness of breath.   Gastrointestinal:  Positive for abdominal pain, diarrhea and nausea. Negative for vomiting.  Musculoskeletal:  Negative for myalgias.  Neurological:  Negative for weakness, light-headedness and headaches.  Hematological:  Negative for adenopathy.     Physical Exam Triage Vital Signs ED Triage Vitals  Enc Vitals Group     BP      Pulse      Resp      Temp      Temp src      SpO2      Weight      Height      Head Circumference      Peak Flow      Pain Score      Pain Loc      Pain Edu?      Excl. in GC?    No data found.  Updated Vital Signs BP (!) 128/92 (BP Location: Right Arm)   Pulse (!) 103   Temp 98 F (36.7 C) (Oral)   Resp 15   Ht 5\' 10"  (1.778 m)   Wt 190 lb (86.2 kg)   SpO2 96%   BMI 27.26 kg/m       Physical Exam Vitals and nursing note reviewed.  Constitutional:      General: He is not in acute distress.    Appearance: Normal appearance. He is well-developed. He is not ill-appearing.  HENT:     Head: Normocephalic and atraumatic.     Mouth/Throat:     Mouth: Mucous membranes are moist.     Pharynx: Oropharynx is clear.  Eyes:     General: No scleral icterus.    Conjunctiva/sclera: Conjunctivae normal.  Cardiovascular:     Rate and Rhythm: Normal rate and regular rhythm.     Heart sounds: Normal heart sounds.  Pulmonary:     Effort: Pulmonary effort is normal. No respiratory distress.     Breath sounds: Normal breath sounds.  Abdominal:     Palpations: Abdomen is soft.     Tenderness: There is abdominal tenderness in the right upper quadrant, periumbilical area and left upper quadrant.  Musculoskeletal:     Cervical back: Neck supple.  Skin:    General: Skin is warm and dry.     Capillary Refill: Capillary refill takes less than 2 seconds.  Neurological:     General: No focal deficit present.     Mental Status: He is alert. Mental status is at baseline.     Motor: No weakness.     Gait: Gait normal.  Psychiatric:        Mood and Affect: Mood normal.        Behavior: Behavior normal.      UC Treatments / Results  Labs (all labs ordered are listed, but only abnormal results are displayed) Labs Reviewed - No data to display  EKG  Radiology No results found.  Procedures Procedures (including critical care time)  Medications Ordered in UC Medications - No data to display  Initial Impression / Assessment and Plan / UC Course  I have reviewed the triage vital signs and the nursing notes.  Pertinent labs & imaging results that were available during my care of the patient were reviewed by me and considered in my medical decision making (see chart for details).   62 year old male with history of HIV and heart disease presents for 2-day history of fever,  fatigue, chills, nausea and numerous episodes diarrhea per day.  Reports he feels little better today than I did yesterday but he has a history of Salmonella from his chickens and has concerns about infectious diarrhea.  Vitals are stable.  He is a little tachycardic at 103 bpm.  He is afebrile.  He is overall well-appearing.  Abdomen soft with tenderness palpation of the right upper quadrant, periumbilical region and left upper quadrant.  No guarding or rebound.  *He does have history of cholecystic cystectomy.  Since patient is feeling better than yesterday and temp has gone down some from the 101 degrees initially,  advised patient that symptoms are most likely consistent with viral gastroenteritis.  However, given his HIV status and heart disease as well as previous history of Salmonella that he picked up from his chickens, I printed prescription for azithromycin in case he is not feeling better in the next 24 hours.  Advised him in the meantime to increase his fluids and continue with Tylenol.  However, if no improvement in 24 more hours and continued fever to go ahead and start the antibiotic.  Advise going to emergency department if uncontrollable fever, weakness, significant or worsening abdominal pain.   Final Clinical Impressions(s) / UC Diagnoses   Final diagnoses:  Diarrhea, unspecified type  Fever, unspecified  Generalized abdominal pain     Discharge Instructions      -Likely viral gastroenteritis. Keep doing what you are doing.  If you do not have any continued improvement over the next 24 hours or symptoms worsen then start the antibiotics to cover you for infectious diarrhea.  ABDOMINAL PAIN: You may take Tylenol for pain relief. Use medications as directed including antiemetics and antidiarrheal medications if suggested or prescribed. You should increase fluids and electrolytes as well as rest over these next several days. If you have any questions or concerns, or if your  symptoms are not improving or if especially if they acutely worsen, please call or stop back to the clinic immediately and we will be happy to help you or go to the ER   ABDOMINAL PAIN RED FLAGS: Seek immediate further care if: symptoms remain the same or worsen over the next 3-7 days, you are unable to keep fluids down, you see blood or mucus in your stool, you vomit black or dark red material, you have a fever of 101.F or higher, you have localized and/or persistent abdominal pain       ED Prescriptions     Medication Sig Dispense Auth. Provider   azithromycin (ZITHROMAX) 500 MG tablet Take 2 tabs PO daily on day 1 and then 1 tab PO daily x 5 days for infectious diarrhea. 7 tablet Eusebio Friendly B, PA-C   dicyclomine (BENTYL) 20 MG tablet Take 1 tablet (20 mg total) by mouth 3 (three) times daily before meals. 20 tablet Gareth Morgan      PDMP not reviewed this encounter.   Michiel Cowboy,  Algis Greenhouse, PA-C 08/15/22 1239

## 2022-08-15 NOTE — Discharge Instructions (Addendum)
-  Likely viral gastroenteritis. Keep doing what you are doing.  If you do not have any continued improvement over the next 24 hours or symptoms worsen then start the antibiotics to cover you for infectious diarrhea.  ABDOMINAL PAIN: You may take Tylenol for pain relief. Use medications as directed including antiemetics and antidiarrheal medications if suggested or prescribed. You should increase fluids and electrolytes as well as rest over these next several days. If you have any questions or concerns, or if your symptoms are not improving or if especially if they acutely worsen, please call or stop back to the clinic immediately and we will be happy to help you or go to the ER   ABDOMINAL PAIN RED FLAGS: Seek immediate further care if: symptoms remain the same or worsen over the next 3-7 days, you are unable to keep fluids down, you see blood or mucus in your stool, you vomit black or dark red material, you have a fever of 101.F or higher, you have localized and/or persistent abdominal pain

## 2022-08-15 NOTE — ED Triage Notes (Signed)
Patient c/o stomach cramps and diarrhea that started 2 days.  Patient reports low grade fevers.

## 2022-08-22 ENCOUNTER — Other Ambulatory Visit (HOSPITAL_COMMUNITY): Payer: Self-pay

## 2022-08-26 ENCOUNTER — Other Ambulatory Visit (HOSPITAL_COMMUNITY): Payer: Self-pay

## 2022-09-18 ENCOUNTER — Other Ambulatory Visit (HOSPITAL_COMMUNITY): Payer: Self-pay

## 2022-09-24 ENCOUNTER — Other Ambulatory Visit (HOSPITAL_COMMUNITY): Payer: Self-pay

## 2022-10-21 ENCOUNTER — Other Ambulatory Visit (HOSPITAL_COMMUNITY): Payer: Self-pay

## 2022-10-29 ENCOUNTER — Other Ambulatory Visit (HOSPITAL_COMMUNITY): Payer: Self-pay

## 2022-11-02 ENCOUNTER — Other Ambulatory Visit: Payer: Self-pay | Admitting: Cardiovascular Disease

## 2022-11-03 ENCOUNTER — Other Ambulatory Visit: Payer: Self-pay

## 2022-11-03 ENCOUNTER — Other Ambulatory Visit: Payer: Self-pay | Admitting: Cardiovascular Disease

## 2022-11-04 ENCOUNTER — Other Ambulatory Visit: Payer: Self-pay

## 2022-11-04 MED FILL — Isosorbide Mononitrate Tab ER 24HR 30 MG: ORAL | 30 days supply | Qty: 30 | Fill #0 | Status: AC

## 2022-11-04 MED FILL — Rosuvastatin Calcium Tab 40 MG: ORAL | 30 days supply | Qty: 30 | Fill #0 | Status: AC

## 2022-11-04 MED FILL — Ezetimibe Tab 10 MG: ORAL | 30 days supply | Qty: 30 | Fill #0 | Status: AC

## 2022-11-04 NOTE — Telephone Encounter (Signed)
Overdue yearly f/u.  Please call patient to get appt scheduled.

## 2022-11-04 NOTE — Telephone Encounter (Signed)
Pt scheduled on 8/2

## 2022-11-05 NOTE — Progress Notes (Signed)
Cardiology Office Note  Date:  11/07/2022   ID:  LAYMON Black, DOB [birth year], MRN [id number]  PCP:  Daniel Sell, MD   Chief Complaint  Patient presents with   12 month follow up     Patient stopped the brilinta the end of May 2024 due to racing heart beats. Patient c/o shortness of breath.     HPI:  Mr. Daniel Black is a very pleasant 62 year old gentleman with  remote smoking history,  coronary artery disease,  MI in 2004 with PCI to the LAD and mid RCA at that time,  PCI in 2010 of the mid RCA lesion,  hyperlipidemia,  HIV, on antivirals  Chronic renal insufficiency creatinine 1.5 Catheterization PCI August 2019 2 stents placed to mid left circumflex Repeat catheterization March 2022 medical management recommended who presents for routine follow up of his coronary artery disease.  Last seen by myself in clinic June 2023 In follow-up reports he is exercising on a regular basis Will go Trail hiking 3 miles at a time, He does appreciate tachycardia Stopped his [place], feels his breathing is better, less tachycardia Started taking aspirin 81 x 2 with food Denies chest pain concerning for angina Stays hydrated in the heat, denies orthostasis symptoms  Feels he is asymptomatic from his PVCs Will sometimes appreciate palpitations  Lab Work reviewed Total cholesterol 89 LDL 29 Creatinine 1.5 BUN 28  EKG personally reviewed by myself on todays visit EKG Interpretation Date/Time:  Friday November 07 2022 13:42:02 EDT Ventricular Rate:  70 PR Interval:  158 QRS Duration:  94 QT Interval:  392 QTC Calculation: 423 R Axis:   -9  Text Interpretation: Sinus rhythm with sinus arrhythmia with occasional Premature ventricular complexes Inferior infarct (cited on or before 20-Nov-2017) When compared with ECG of 10-Aug-2019 14:05, Premature ventricular complexes are now Present Confirmed by Julien Nordmann [phone]) on 11/07/2022 1:53:40 PM   Results reviewed Catheterization  March 2022 Patent stents LAD, circumflex, RCA Mid to distal LAD with moderate disease, tapers significantly from proximal to mid region, small caliber vessel mid to distal Normal ejection fraction, no significant aortic valve stenosis No significant change compared to prior catheterization 2019, unable to exclude slight progression of mid LAD disease  STEMI 11/2017 Stent to the distal RCA and posterior AV groove segment Status post staged PCI to the LCx  11/19/2017., second stent placed for suspected edge dissection  Cardiac catheterization report from April 2010 details patent LAD stent, 30% distal LAD, 25% proximal left circumflex, 20% mid circumflex, 95% mid RCA, 40% distal RCA, 30% right PDA. Xience 3.5 x 28 mm DES stent placed to the mid RCA.     PMH:   has a past medical history of Asymptomatic HIV infection (HCC), Bell's palsy (2004), CAD (coronary artery disease), DDD (degenerative disc disease) (10/02), Erectile dysfunction, HLD (hyperlipidemia), Myocardial infarction (HCC) (2004), Nephrolithiasis, and Skin cancer, basal cell.  PSH:    Past Surgical History:  Procedure Laterality Date   AMPUTATION  1985   L fingers reattachement    back fusion     lumbar   CARDIAC CATHETERIZATION     x2. 2004, 2010. 3 stents(total)   CHOLECYSTECTOMY  3/03   COLONOSCOPY WITH PROPOFOL N/A 06/13/2016   Procedure: COLONOSCOPY WITH PROPOFOL;  Surgeon: Midge Minium, MD;  Location: [facility];  Service: Endoscopy;  Laterality: N/A;   COLONOSCOPY WITH PROPOFOL N/A 07/01/2021   Procedure: COLONOSCOPY WITH PROPOFOL;  Surgeon: Midge Minium, MD;  Location: [facility];  Service:  Endoscopy;  Laterality: N/A;   CORONARY/GRAFT ACUTE MI REVASCULARIZATION N/A 11/16/2017   Procedure: Coronary/Graft Acute MI Revascularization;  Surgeon: Iran Ouch, MD;  Location: [facility];  Service: Cardiovascular;  Laterality: N/A;   LEFT HEART CATH AND CORONARY ANGIOGRAPHY N/A 11/16/2017    Procedure: LEFT HEART CATH AND CORONARY ANGIOGRAPHY;  Surgeon: Iran Ouch, MD;  Location: [facility] CV [facility];  Service: Cardiovascular;  Laterality: N/A;   LEFT HEART CATH AND CORONARY ANGIOGRAPHY N/A 11/19/2017   Procedure: LEFT HEART CATH AND CORONARY ANGIOGRAPHY;  Surgeon: Iran Ouch, MD;  Location: [facility];  Service: Cardiovascular;  Laterality: N/A;   LEFT HEART CATH AND CORONARY ANGIOGRAPHY Left 06/14/2020   Procedure: LEFT HEART CATH AND CORONARY ANGIOGRAPHY;  Surgeon: Antonieta Iba, MD;  Location: [facility];  Service: Cardiovascular;  Laterality: Left;   POLYPECTOMY  06/13/2016   Procedure: POLYPECTOMY;  Surgeon: Midge Minium, MD;  Location: [facility];  Service: Endoscopy;;   POLYPECTOMY  07/01/2021   Procedure: POLYPECTOMY;  Surgeon: Midge Minium, MD;  Location: [facility];  Service: Endoscopy;;   WRIST SURGERY  10/2011    Current Outpatient Medications  Medication Sig Dispense Refill   aspirin EC 81 MG tablet Take 81 mg by mouth in the morning and at bedtime. Swallow whole.     bictegravir-emtricitabine-tenofovir AF (BIKTARVY) 50-200-25 MG TABS tablet Take 1 tablet by mouth once daily 30 tablet 11   cyclobenzaprine (FLEXERIL) 5 MG tablet Take 1 tablet (5 mg total) by mouth 3 (three) times daily as needed. 30 tablet 0   ezetimibe (ZETIA) 10 MG tablet Take 1 tablet (10 mg total) by mouth daily. PLEASE CALL OFFICE TO SCHEDULE APPOINTMENT PRIOR TO NEXT REFILL (1st attempt) 30 tablet 0   fluticasone (FLONASE) 50 MCG/ACT nasal spray Place 1 spray into both nostrils daily as needed for allergies or rhinitis.     isosorbide mononitrate (IMDUR) 30 MG 24 hr tablet Take 1 tablet (30 mg total) by mouth daily. PLEASE CALL OFFICE TO SCHEDULE APPOINTMENT PRIOR TO NEXT REFILL 30 tablet 0   nitroGLYCERIN (NITROSTAT) 0.4 MG SL tablet Place 1 tablet (0.4 mg total) under the tongue every 5 (five) minutes as needed for chest pain. 25 tablet 0    rosuvastatin (CRESTOR) 40 MG tablet Take 1 tablet (40 mg total) by mouth daily. PLEASE CALL OFFICE TO SCHEDULE APPOINTMENT PRIOR TO NEXT REFILL 30 tablet 0   acetaminophen (TYLENOL) 325 MG tablet Take 650 mg by mouth every 6 (six) hours as needed for moderate pain or headache. (Patient not taking: Reported on 09/20/2021)     dicyclomine (BENTYL) 20 MG tablet Take 1 tablet (20 mg total) by mouth 3 (three) times daily before meals. (Patient not taking: Reported on 11/07/2022) 20 tablet 0   diphenhydramine-acetaminophen (TYLENOL PM) 25-500 MG TABS tablet Take 1 tablet by mouth at bedtime as needed (sleep). (Patient not taking: Reported on 09/20/2021)     No current facility-administered medications for this visit.    Allergies:   Patient has no known allergies.   Social History:  The patient  reports that he quit smoking about 12 years ago. His smoking use included cigarettes. He has never used smokeless tobacco. He reports that he does not drink alcohol and does not use drugs.   Family History:   family history includes Cancer in his father; Coronary artery disease in his father and another family member; Hearing loss in his father and mother; Heart  disease in his father and mother; Hypertension in his father and mother; Kidney disease in his father; Stroke in his father; Varicose Veins in his father; Vision loss in his father.   Review of Systems: Review of Systems  Constitutional: Negative.   HENT: Negative.    Respiratory:  Positive for shortness of breath.   Cardiovascular:  Positive for palpitations.  Gastrointestinal: Negative.   Musculoskeletal: Negative.   Neurological: Negative.   Psychiatric/Behavioral: Negative.    All other systems reviewed and are negative.   PHYSICAL EXAM: VS:  BP 110/80 (BP Location: Left Arm, Patient Position: Sitting, Cuff Size: Normal)   Pulse 70   Ht 5\' 10"  (1.778 m)   Wt 198 lb 2 oz (89.9 kg)   SpO2 98%   BMI 28.43 kg/m  , BMI Body mass index is 28.43  kg/m.  Constitutional:  oriented to person, place, and time. No distress.  HENT:  Head: Grossly normal Eyes:  no discharge. No scleral icterus.  Neck: No JVD, no carotid bruits  Cardiovascular: Regular rate and rhythm, no murmurs appreciated Pulmonary/Chest: Clear to auscultation bilaterally, no wheezes or rails Abdominal: Soft.  no distension.  no tenderness.  Musculoskeletal: Normal range of motion Neurological:  normal muscle tone. Coordination normal. No atrophy Skin: Skin warm and dry Psychiatric: normal affect, pleasant  Recent Labs: No results found for requested labs within last 365 days.    Lipid Panel Lab Results  Component Value Date   CHOL 86 07/03/2020   HDL 49 07/03/2020   LDLCALC 28 07/03/2020   TRIG 43 07/03/2020     Wt Readings from Last 3 Encounters:  11/07/22 198 lb 2 oz (89.9 kg)  08/15/22 190 lb (86.2 kg)  09/20/21 197 lb 2 oz (89.4 kg)     ASSESSMENT AND PLAN:  Atherosclerosis of native coronary artery without angina pectoris, unspecified whether native or transplanted heart -  cardiac catheterization March 2022, patent stents, 50% mid LAD disease Normal LV function on echo July 2023 Brilinta secondary to shortness of breath and palpitations, feels better off the medication chronic, Zetia, Crestor  Shortness of breath Tolerating exercise climbing Breathing stable,  reports times  improved off Brilinta  Mixed hyperlipidemia Continue Crestor and Zetia Cholesterol low, at goal  Human immunodeficiency virus (HIV) disease (HCC) Managed by ID antivirals  PVCs Low PVC burden on prior monitor Relatively asymptomatic Recommend he take metoprolol  tartrate 12.5 up to 25 mg twice daily as needed for palpitation symptoms    Total encounter time more than 30 minutes  Greater than 50% was spent in counseling and coordination of care with the patient    Orders Placed This Encounter  Procedures   EKG 12-Lead     Signed, Dossie Arbour, M.D.,  Ph.D. 11/07/2022  [facility] [place], [place] [phone]

## 2022-11-06 ENCOUNTER — Other Ambulatory Visit: Payer: Self-pay

## 2022-11-07 ENCOUNTER — Other Ambulatory Visit: Payer: Self-pay

## 2022-11-07 ENCOUNTER — Encounter: Payer: Self-pay | Admitting: Cardiovascular Disease

## 2022-11-07 ENCOUNTER — Ambulatory Visit: Payer: Commercial Managed Care - PPO | Attending: Cardiovascular Disease | Admitting: Cardiovascular Disease

## 2022-11-07 VITALS — BP 110/80 | HR 70 | Ht 70.0 in | Wt 198.1 lb

## 2022-11-07 DIAGNOSIS — E785 Hyperlipidemia, unspecified: Secondary | ICD-10-CM | POA: Diagnosis not present

## 2022-11-07 DIAGNOSIS — R0602 Shortness of breath: Secondary | ICD-10-CM | POA: Diagnosis not present

## 2022-11-07 DIAGNOSIS — R079 Chest pain, unspecified: Secondary | ICD-10-CM

## 2022-11-07 DIAGNOSIS — N183 Chronic kidney disease, stage 3 unspecified: Secondary | ICD-10-CM | POA: Diagnosis not present

## 2022-11-07 DIAGNOSIS — I25118 Atherosclerotic heart disease of native coronary artery with other forms of angina pectoris: Secondary | ICD-10-CM

## 2022-11-07 DIAGNOSIS — B20 Human immunodeficiency virus [HIV] disease: Secondary | ICD-10-CM

## 2022-11-07 DIAGNOSIS — I493 Ventricular premature depolarization: Secondary | ICD-10-CM | POA: Diagnosis not present

## 2022-11-07 DIAGNOSIS — K219 Gastro-esophageal reflux disease without esophagitis: Secondary | ICD-10-CM | POA: Diagnosis not present

## 2022-11-07 MED ORDER — ISOSORBIDE MONONITRATE ER 30 MG PO TB24
30.0000 mg | ORAL_TABLET | Freq: Every day | ORAL | 3 refills | Status: DC
  Filled 2022-11-07 – 2022-11-29 (×2): qty 90, 90d supply, fill #0
  Filled 2023-02-24 – 2023-03-02 (×3): qty 90, 90d supply, fill #1
  Filled 2023-05-28: qty 90, 90d supply, fill #2
  Filled 2023-08-18: qty 30, 30d supply, fill #3

## 2022-11-07 MED ORDER — ROSUVASTATIN CALCIUM 40 MG PO TABS
40.0000 mg | ORAL_TABLET | Freq: Every day | ORAL | 3 refills | Status: DC
  Filled 2022-11-07 – 2022-11-29 (×2): qty 90, 90d supply, fill #0
  Filled 2023-02-24 – 2023-03-02 (×3): qty 90, 90d supply, fill #1
  Filled 2023-05-28: qty 90, 90d supply, fill #2
  Filled 2023-08-18: qty 30, 30d supply, fill #3

## 2022-11-07 MED ORDER — EZETIMIBE 10 MG PO TABS
10.0000 mg | ORAL_TABLET | Freq: Every day | ORAL | 3 refills | Status: DC
  Filled 2022-11-07 – 2022-11-29 (×2): qty 90, 90d supply, fill #0
  Filled 2023-02-24 – 2023-03-02 (×3): qty 90, 90d supply, fill #1
  Filled 2023-05-28: qty 90, 90d supply, fill #2
  Filled 2023-08-18: qty 30, 30d supply, fill #3

## 2022-11-07 NOTE — Patient Instructions (Signed)
Medication Instructions:  Metoprolol tartrate 25 mg twice a day as needed for palpitations  If you need a refill on your cardiac medications before your next appointment, please call your pharmacy.   Lab work: No new labs needed  Testing/Procedures: No new testing needed  Follow-Up: At [facility], you and your health needs are our priority.  As part of our continuing mission to provide you with exceptional heart care, we have created designated Provider Care Teams.  These Care Teams include your primary Cardiologist (physician) and Advanced Practice Providers (APPs -  Physician Assistants and Nurse Practitioners) who all work together to provide you with the care you need, when you need it.  You will need a follow up appointment in 12 months  Providers on your designated Care Team:   Nicolasa Ducking, NP Eula Listen, PA-C Cadence Fransico Michael, [place]  COVID-19 Vaccine Information can be found at: [email or web address] For questions related to vaccine distribution or appointments, please email [email or web address] or call [phone].

## 2022-11-10 ENCOUNTER — Other Ambulatory Visit: Payer: Self-pay

## 2022-11-10 MED ORDER — METOPROLOL TARTRATE 25 MG PO TABS
25.0000 mg | ORAL_TABLET | Freq: Two times a day (BID) | ORAL | 3 refills | Status: DC | PRN
  Filled 2022-11-10: qty 180, 90d supply, fill #0
  Filled 2023-02-03: qty 180, 90d supply, fill #1
  Filled 2023-04-20: qty 180, 90d supply, fill #2
  Filled 2023-05-28: qty 180, 90d supply, fill #3

## 2022-11-21 ENCOUNTER — Other Ambulatory Visit (HOSPITAL_COMMUNITY): Payer: Self-pay

## 2022-11-21 ENCOUNTER — Other Ambulatory Visit: Payer: Self-pay

## 2022-11-24 ENCOUNTER — Other Ambulatory Visit: Payer: Self-pay

## 2022-11-25 DIAGNOSIS — E782 Mixed hyperlipidemia: Secondary | ICD-10-CM | POA: Diagnosis not present

## 2022-11-25 DIAGNOSIS — N1831 Chronic kidney disease, stage 3a: Secondary | ICD-10-CM | POA: Diagnosis not present

## 2022-11-25 DIAGNOSIS — M722 Plantar fascial fibromatosis: Secondary | ICD-10-CM | POA: Diagnosis not present

## 2022-11-25 DIAGNOSIS — I2510 Atherosclerotic heart disease of native coronary artery without angina pectoris: Secondary | ICD-10-CM | POA: Diagnosis not present

## 2022-11-25 DIAGNOSIS — Z21 Asymptomatic human immunodeficiency virus [HIV] infection status: Secondary | ICD-10-CM | POA: Diagnosis not present

## 2022-11-26 ENCOUNTER — Other Ambulatory Visit: Payer: Self-pay

## 2022-11-29 ENCOUNTER — Other Ambulatory Visit (HOSPITAL_COMMUNITY): Payer: Self-pay

## 2022-12-01 ENCOUNTER — Other Ambulatory Visit: Payer: Self-pay

## 2022-12-02 DIAGNOSIS — E782 Mixed hyperlipidemia: Secondary | ICD-10-CM | POA: Diagnosis not present

## 2022-12-02 DIAGNOSIS — I2510 Atherosclerotic heart disease of native coronary artery without angina pectoris: Secondary | ICD-10-CM | POA: Diagnosis not present

## 2022-12-02 DIAGNOSIS — Z21 Asymptomatic human immunodeficiency virus [HIV] infection status: Secondary | ICD-10-CM | POA: Diagnosis not present

## 2022-12-02 DIAGNOSIS — N1831 Chronic kidney disease, stage 3a: Secondary | ICD-10-CM | POA: Diagnosis not present

## 2022-12-15 ENCOUNTER — Other Ambulatory Visit: Payer: Self-pay

## 2022-12-27 ENCOUNTER — Encounter (HOSPITAL_COMMUNITY): Payer: Self-pay

## 2023-01-15 ENCOUNTER — Other Ambulatory Visit: Payer: Self-pay

## 2023-01-16 ENCOUNTER — Other Ambulatory Visit: Payer: Self-pay

## 2023-01-16 NOTE — Progress Notes (Signed)
Specialty Pharmacy Refill Coordination Note  Daniel Black is a 63 y.o. male contacted today regarding refills of specialty medication(s) Bictegravir-Emtricitab-Tenofov   Patient requested Delivery   Delivery date: 01/23/23   Verified address: [name]   [facility] [place] [address]   Medication will be filled on 01/22/23.

## 2023-01-22 ENCOUNTER — Other Ambulatory Visit: Payer: Self-pay

## 2023-02-03 ENCOUNTER — Other Ambulatory Visit (HOSPITAL_COMMUNITY): Payer: Self-pay

## 2023-02-09 ENCOUNTER — Other Ambulatory Visit: Payer: Self-pay

## 2023-02-11 ENCOUNTER — Other Ambulatory Visit: Payer: Self-pay

## 2023-02-20 ENCOUNTER — Other Ambulatory Visit: Payer: Self-pay

## 2023-02-20 NOTE — Progress Notes (Signed)
Specialty Pharmacy Refill Coordination Note  Daniel Black is a 62 y.o. male contacted today regarding refills of specialty medication(s) Bictegravir-Emtricitab-Tenofov   Patient requested Delivery   Delivery date: 02/27/23   Verified address: [name]   [facility] [place] [address]   Medication will be filled on 02/26/23.

## 2023-02-24 ENCOUNTER — Encounter: Payer: Self-pay | Admitting: Pharmacist

## 2023-02-24 ENCOUNTER — Other Ambulatory Visit: Payer: Self-pay

## 2023-02-24 ENCOUNTER — Other Ambulatory Visit (HOSPITAL_COMMUNITY): Payer: Self-pay

## 2023-02-26 ENCOUNTER — Other Ambulatory Visit (HOSPITAL_COMMUNITY): Payer: Self-pay

## 2023-02-27 ENCOUNTER — Other Ambulatory Visit: Payer: Self-pay

## 2023-02-27 ENCOUNTER — Other Ambulatory Visit (HOSPITAL_COMMUNITY): Payer: Self-pay

## 2023-02-28 ENCOUNTER — Other Ambulatory Visit (HOSPITAL_COMMUNITY): Payer: Self-pay

## 2023-03-02 ENCOUNTER — Telehealth: Payer: Self-pay | Admitting: Cardiovascular Disease

## 2023-03-02 ENCOUNTER — Encounter: Payer: Self-pay | Admitting: Cardiovascular Disease

## 2023-03-02 ENCOUNTER — Other Ambulatory Visit: Payer: Self-pay

## 2023-03-02 MED ORDER — CYCLOBENZAPRINE HCL 5 MG PO TABS
5.0000 mg | ORAL_TABLET | Freq: Three times a day (TID) | ORAL | 5 refills | Status: AC | PRN
  Filled 2023-03-02 (×2): qty 30, 10d supply, fill #0

## 2023-03-02 NOTE — Telephone Encounter (Signed)
The patient sent a MyChart message this morning requesting cardiac testing and then reporting symptoms of chest tightness and tingling going down his arm.  Attempted to contact the patient by phone to follow up on his symptoms. No answer- I left a message I was going to respond back to his mychart, but wanted to follow up further on his reported symptoms to decide on how best to triage him at this time.

## 2023-03-03 NOTE — Telephone Encounter (Signed)
Left a message for the patient to call back.

## 2023-03-04 NOTE — Telephone Encounter (Signed)
Called patient and left a message to call back.

## 2023-03-25 ENCOUNTER — Other Ambulatory Visit (HOSPITAL_COMMUNITY): Payer: Self-pay

## 2023-03-25 ENCOUNTER — Other Ambulatory Visit: Payer: Self-pay

## 2023-03-25 NOTE — Progress Notes (Signed)
Specialty Pharmacy Refill Coordination Note  Daniel Black is a 62 y.o. male contacted today regarding refills of specialty medication(s) Bictegravir-Emtricitab-Tenofov [name])   Patient requested Delivery   Delivery date: 03/30/23   Verified address: [name]   [facility] [address]   Medication will be filled on 12.20.24.

## 2023-03-27 ENCOUNTER — Other Ambulatory Visit: Payer: Self-pay

## 2023-04-20 ENCOUNTER — Other Ambulatory Visit: Payer: Self-pay

## 2023-04-21 ENCOUNTER — Other Ambulatory Visit: Payer: Self-pay

## 2023-04-22 ENCOUNTER — Other Ambulatory Visit: Payer: Self-pay

## 2023-04-22 NOTE — Progress Notes (Signed)
 Specialty Pharmacy Refill Coordination Note  Daniel Black is a 63 y.o. male contacted today regarding refills of specialty medication(s) Bictegravir-Emtricitab-Tenofov (Biktarvy )   Patient requested Delivery   Delivery date: 05/01/23   Verified address: [address]   [place] [address]   Medication will be filled on 01.23.25.

## 2023-04-30 ENCOUNTER — Other Ambulatory Visit: Payer: Self-pay

## 2023-04-30 ENCOUNTER — Other Ambulatory Visit (HOSPITAL_COMMUNITY): Payer: Self-pay

## 2023-05-20 ENCOUNTER — Encounter (HOSPITAL_COMMUNITY): Payer: Self-pay

## 2023-05-20 ENCOUNTER — Other Ambulatory Visit: Payer: Self-pay

## 2023-05-20 NOTE — Progress Notes (Signed)
Specialty Pharmacy Refill Coordination Note  Daniel Black is a 63 y.o. male contacted today regarding refills of specialty medication(s) Bictegravir-Emtricitab-Tenofov [name])   Patient requested Delivery   Delivery date: 05/28/23   Verified address: [name]   [facility] [place] [address]   Medication will be filled on 05/27/23.   This fill date is pending response to refill request from provider. Patient is aware and if they have not received fill by intended date they must follow up with pharmacy.

## 2023-05-20 NOTE — Progress Notes (Signed)
Specialty Pharmacy Ongoing Clinical Assessment Note  Daniel Black is a 63 y.o. male who is being followed by the specialty pharmacy service for RxSp HIV   Patient's specialty medication(s) reviewed today: Bictegravir-Emtricitab-Tenofov (Biktarvy)   Missed doses in the last 4 weeks: 0   Patient/Caregiver did not have any additional questions or concerns.   Therapeutic benefit summary: Patient is achieving benefit   Adverse events/side effects summary: No adverse events/side effects   Patient's therapy is appropriate to: Continue    Goals Addressed             This Visit's Progress    Achieve Undetectable HIV Viral Load < 20       Patient is on track. Patient will maintain adherence         Follow up:  6 months  Otto Herb Specialty Pharmacist

## 2023-05-26 DIAGNOSIS — E782 Mixed hyperlipidemia: Secondary | ICD-10-CM | POA: Diagnosis not present

## 2023-05-26 DIAGNOSIS — I2510 Atherosclerotic heart disease of native coronary artery without angina pectoris: Secondary | ICD-10-CM | POA: Diagnosis not present

## 2023-05-26 DIAGNOSIS — Z21 Asymptomatic human immunodeficiency virus [HIV] infection status: Secondary | ICD-10-CM | POA: Diagnosis not present

## 2023-05-27 ENCOUNTER — Other Ambulatory Visit (HOSPITAL_COMMUNITY): Payer: Self-pay

## 2023-05-28 ENCOUNTER — Other Ambulatory Visit (HOSPITAL_COMMUNITY): Payer: Self-pay

## 2023-05-29 ENCOUNTER — Other Ambulatory Visit: Payer: Self-pay | Admitting: Pharmacist

## 2023-05-29 DIAGNOSIS — B20 Human immunodeficiency virus [HIV] disease: Secondary | ICD-10-CM

## 2023-06-01 ENCOUNTER — Other Ambulatory Visit: Payer: Self-pay

## 2023-06-02 ENCOUNTER — Telehealth: Payer: Self-pay | Admitting: Pharmacist

## 2023-06-02 ENCOUNTER — Other Ambulatory Visit (HOSPITAL_COMMUNITY): Payer: Self-pay

## 2023-06-02 ENCOUNTER — Other Ambulatory Visit: Payer: Self-pay

## 2023-06-02 DIAGNOSIS — Z21 Asymptomatic human immunodeficiency virus [HIV] infection status: Secondary | ICD-10-CM | POA: Diagnosis not present

## 2023-06-02 DIAGNOSIS — E782 Mixed hyperlipidemia: Secondary | ICD-10-CM | POA: Diagnosis not present

## 2023-06-02 DIAGNOSIS — I2510 Atherosclerotic heart disease of native coronary artery without angina pectoris: Secondary | ICD-10-CM | POA: Diagnosis not present

## 2023-06-02 DIAGNOSIS — N1831 Chronic kidney disease, stage 3a: Secondary | ICD-10-CM | POA: Diagnosis not present

## 2023-06-02 MED ORDER — BIKTARVY 50-200-25 MG PO TABS
1.0000 | ORAL_TABLET | Freq: Every day | ORAL | 11 refills | Status: DC
  Filled 2023-06-03: qty 30, 30d supply, fill #0

## 2023-06-02 NOTE — Telephone Encounter (Signed)
 Called patient to discuss HIV medication, Biktarvy, for [employer]. No answer, left HIPAA compliant VM.  Margarite Gouge, PharmD, CPP, BCIDP, AAHIVP Clinical Pharmacist Practitioner Infectious Diseases Clinical Pharmacist [facility]

## 2023-06-02 NOTE — Progress Notes (Signed)
 Received refills fromoriginal provider and sent to [facility] for rewrite. She will need to speak to patient before the rewrite will be sent.

## 2023-06-03 ENCOUNTER — Other Ambulatory Visit (HOSPITAL_COMMUNITY): Payer: Self-pay

## 2023-06-03 ENCOUNTER — Other Ambulatory Visit: Payer: Self-pay

## 2023-06-03 ENCOUNTER — Ambulatory Visit (INDEPENDENT_AMBULATORY_CARE_PROVIDER_SITE_OTHER): Payer: Commercial Managed Care - PPO | Admitting: Pharmacist

## 2023-06-03 ENCOUNTER — Other Ambulatory Visit: Payer: Self-pay | Admitting: Pharmacist

## 2023-06-03 DIAGNOSIS — B20 Human immunodeficiency virus [HIV] disease: Secondary | ICD-10-CM

## 2023-06-03 MED ORDER — BIKTARVY 50-200-25 MG PO TABS
1.0000 | ORAL_TABLET | Freq: Every day | ORAL | 11 refills | Status: DC
  Filled 2023-06-03: qty 30, 30d supply, fill #0
  Filled 2023-07-07 – 2024-02-04 (×4): qty 30, 30d supply, fill #1
  Filled 2024-03-06 – 2024-03-07 (×2): qty 30, 30d supply, fill #2
  Filled 2024-04-11: qty 30, 30d supply, fill #3

## 2023-06-03 NOTE — Progress Notes (Signed)
 Virtual Visit via Telephone Note  I connected with  Daniel Black on 06/03/23 at 11:15 AM EST by telephone and verified that I am speaking with the correct person using two identifiers.  Location: Patient: Work Provider: Clinic   I discussed the limitations, risks, security and privacy concerns of performing an evaluation and management service by telephone and the availability of in person appointments. I also discussed with the patient that there may be a patient responsible charge related to this service. The patient expressed understanding and agreed to proceed.  Date:  06/03/2023   HPI: Daniel Black is a 63 y.o. male who presents for follow-up of their specialty HIV medication, Biktarvy.  Patient Active Problem List   Diagnosis Date Noted   History of colonic polyps    Polyp of transverse colon    Unstable angina (HCC) 06/14/2020   Acute ST elevation myocardial infarction (STEMI) of inferior wall (HCC) 11/16/2017   CAD in native artery 11/16/2017   AKI (acute kidney injury) (HCC) 11/16/2017   Special screening for malignant neoplasms, colon    Benign neoplasm of ascending colon    Human immunodeficiency virus (HIV) disease (HCC) 11/25/2009   Hyperlipidemia 11/23/2009   Coronary atherosclerosis 11/23/2009   Chest pain 11/23/2009    Patient's Medications  New Prescriptions   No medications on file  Previous Medications   ACETAMINOPHEN (TYLENOL) 325 MG TABLET    Take 650 mg by mouth every 6 (six) hours as needed for moderate pain or headache.   ASPIRIN EC 81 MG TABLET    Take 81 mg by mouth in the morning and at bedtime. Swallow whole.   CYCLOBENZAPRINE (FLEXERIL) 5 MG TABLET    Take 1 tablet (5 mg total) by mouth 3 (three) times daily as needed.   DICYCLOMINE (BENTYL) 20 MG TABLET    Take 1 tablet (20 mg total) by mouth 3 (three) times daily before meals.   DIPHENHYDRAMINE-ACETAMINOPHEN (TYLENOL PM) 25-500 MG TABS TABLET    Take 1 tablet by mouth at bedtime as needed  (sleep).   EZETIMIBE (ZETIA) 10 MG TABLET    Take 1 tablet (10 mg total) by mouth daily. PLEASE CALL OFFICE TO SCHEDULE APPOINTMENT PRIOR TO NEXT REFILL (1st attempt)   FLUTICASONE (FLONASE) 50 MCG/ACT NASAL SPRAY    Place 1 spray into both nostrils daily as needed for allergies or rhinitis.   ISOSORBIDE MONONITRATE (IMDUR) 30 MG 24 HR TABLET    Take 1 tablet (30 mg total) by mouth daily. PLEASE CALL OFFICE TO SCHEDULE APPOINTMENT PRIOR TO NEXT REFILL   METOPROLOL TARTRATE (LOPRESSOR) 25 MG TABLET    Take 1 tablet (25 mg total) by mouth 2 (two) times daily as needed (for palpitations).   NITROGLYCERIN (NITROSTAT) 0.4 MG SL TABLET    Place 1 tablet (0.4 mg total) under the tongue every 5 (five) minutes as needed for chest pain.   ROSUVASTATIN (CRESTOR) 40 MG TABLET    Take 1 tablet (40 mg total) by mouth daily. PLEASE CALL OFFICE TO SCHEDULE APPOINTMENT PRIOR TO NEXT REFILL  Modified Medications   Modified Medication Previous Medication   BICTEGRAVIR-EMTRICITABINE-TENOFOVIR AF (BIKTARVY) 50-200-25 MG TABS TABLET bictegravir-emtricitabine-tenofovir AF (BIKTARVY) 50-200-25 MG TABS tablet      Take 1 tablet by mouth once daily    Take 1 tablet by mouth once daily  Discontinued Medications   No medications on file    Allergies: No Known Allergies  Past Medical History: Past Medical History:  Diagnosis Date  Asymptomatic HIV infection (HCC)    Bell's palsy 2004   CAD (coronary artery disease)    a. MI in 2004 w/ PCI to LAD and mRCA; b. LHC 07/2008: patent LAD stent, 30% distal LAD stenosis, 25% proximal LCx stenosis, 20% mid LCx stenosis, 95% mid RCA stenosis s/p PCI/DES (Xience 3.5 x 28 mm), and 40% distal RCA stenosis   DDD (degenerative disc disease) 10/02   has lumbar fusion   Erectile dysfunction    HLD (hyperlipidemia)    mixed   Myocardial infarction (HCC) 2004   x 3- last was July 2019 has 6 stents   Nephrolithiasis    stage 3   Skin cancer, basal cell    skin    Social  History: Social History   Socioeconomic History   Marital status: Media planner    Spouse name: Not on file   Number of children: Not on file   Years of education: Not on file   Highest education level: Not on file  Occupational History   Occupation: Orderly/CNA    Employer: [employer]    Comment: Full time  Tobacco Use   Smoking status: Former    Current packs/day: 0.00    Types: Cigarettes    Quit date: 04/07/2010    Years since quitting: 13.1   Smokeless tobacco: Never  Vaping Use   Vaping status: Never Used  Substance and Sexual Activity   Alcohol use: No    Comment: occasional (1x/mo)   Drug use: No   Sexual activity: Not on file  Other Topics Concern   Not on file  Social History Narrative   Single - domestic partner.    Full time - orderly/ CNA   Regular exercise - ride, walk, run             Social Drivers of Corporate investment banker Strain: Not on file  Food Insecurity: Not on file  Transportation Needs: Not on file  Physical Activity: Not on file  Stress: Not on file  Social Connections: Not on file        No data to display          Labs:  SCr: Lab Results  Component Value Date   CREATININE 1.40 (H) 06/11/2020   CREATININE 1.75 (H) 08/10/2019   CREATININE 1.37 (H) 05/17/2019   CREATININE 1.47 (H) 11/11/2018   CREATININE 1.30 (H) 05/12/2018   HIV No results found for: "HIV" Hepatitis B Lab Results  Component Value Date   HEPBSAG Negative 05/13/2018   Hepatitis C No results found for: "HEPCAB", "HCVRNAPCRQN" Hepatitis A No results found for: "HAV" RPR and STI Lab Results  Component Value Date   LABRPR NON REACTIVE 05/19/2019   LABRPR Non Reactive 05/13/2018   LABRPR Non Reactive 04/20/2015        No data to display          Assessment: I spoke with Daniel Black today regarding their specialty medication, Biktarvy for HIV treatment. Patient takes it every day without any issues or missed doses. No problems with adverse effects  or tolerability. No problems getting it from [facility]. Updated/reviewed medication list - no drug interactions. All questions answered. Will follow up in 1 year.  States he is losing insurance at the end of this week and let Dr. Sampson Goon know this. Informed him to reach out to his ID office to see if they offer Juanell Fairly assistance if he is not planning on applying for new insurance.  Also informed him that if he switching to another plan that would not be active by the time he runs out of this current fill, to also reach out to his ID clinic or even our clinic and ask about samples.   Plan: - Continue Biktarvy - Follow up in 1 year  I discussed the assessment and treatment plan with the patient. The patient was provided an opportunity to ask questions and all were answered. The patient agreed with the plan and demonstrated an understanding of the instructions.   The patient was advised to call back or seek an in-person evaluation if the symptoms worsen or if the condition fails to improve as anticipated.  I provided 20 minutes of non-face-to-face time during this encounter.  Margarite Gouge, PharmD, CPP, BCIDP, AAHIVP Clinical Pharmacist Practitioner Infectious Diseases Clinical Pharmacist [facility] for Infect[facility] Disease

## 2023-06-03 NOTE — Progress Notes (Signed)
 Specialty Pharmacy Refill Coordination Note  Daniel Black is a 63 y.o. male contacted today regarding refills of specialty medication(s) Bictegravir-Emtricitab-Tenofov [name])   Patient requested Delivery   Delivery date: 06/05/23   Verified address: [name]   [facility] [place] [address]   Medication will be filled on 06/04/23.   Patient reported he will not have insurance after 06/05/23.   [facility] notified to reach out to patient to provide medication assistance.

## 2023-06-04 ENCOUNTER — Other Ambulatory Visit (HOSPITAL_COMMUNITY): Payer: Self-pay

## 2023-06-04 ENCOUNTER — Other Ambulatory Visit: Payer: Self-pay

## 2023-06-23 ENCOUNTER — Other Ambulatory Visit: Payer: Self-pay

## 2023-06-24 ENCOUNTER — Other Ambulatory Visit: Payer: Self-pay

## 2023-07-07 ENCOUNTER — Other Ambulatory Visit: Payer: Self-pay

## 2023-07-07 ENCOUNTER — Other Ambulatory Visit (HOSPITAL_COMMUNITY): Payer: Self-pay

## 2023-07-07 NOTE — Progress Notes (Addendum)
 Specialty Pharmacy Refill Coordination Note  Daniel Black is a 63 y.o. male contacted today regarding refills of specialty medication(s) Bictegravir-Emtricitab-Tenofov [name])   Patient requested Delivery   Delivery date: 07/08/23   Verified address: [name]   [facility] [place] [address]   Medication will be filled on 07/07/23, or as soon as approved. Call patient If there is any copay.   This fill date is pending response to refill request from provider. Patient is aware and if they have not received fill by intended date, they must follow up with pharmacy.

## 2023-07-08 ENCOUNTER — Other Ambulatory Visit (HOSPITAL_COMMUNITY): Payer: Self-pay

## 2023-07-08 ENCOUNTER — Other Ambulatory Visit: Payer: Self-pay

## 2023-07-08 NOTE — Progress Notes (Signed)
 Called [facility]. Their computers are down. Receptionist will put in request once computers are back up. Office is aware that patient will be leaving for vacation on Friday 04/04.

## 2023-07-09 ENCOUNTER — Other Ambulatory Visit (HOSPITAL_COMMUNITY): Payer: Self-pay

## 2023-07-09 ENCOUNTER — Other Ambulatory Visit: Payer: Self-pay

## 2023-07-09 MED ORDER — BIKTARVY 50-200-25 MG PO TABS
1.0000 | ORAL_TABLET | Freq: Every day | ORAL | 11 refills | Status: DC
  Filled 2023-07-09: qty 30, 30d supply, fill #0
  Filled 2023-07-28 – 2024-02-04 (×3): qty 30, 30d supply, fill #1

## 2023-07-20 DIAGNOSIS — S92354A Nondisplaced fracture of fifth metatarsal bone, right foot, initial encounter for closed fracture: Secondary | ICD-10-CM | POA: Diagnosis not present

## 2023-07-27 ENCOUNTER — Other Ambulatory Visit: Payer: Self-pay

## 2023-07-28 ENCOUNTER — Other Ambulatory Visit: Payer: Self-pay

## 2023-07-28 NOTE — Progress Notes (Signed)
 Specialty Pharmacy Refill Coordination Note  Daniel Black is a 63 y.o. male contacted today regarding refills of specialty medication(s) Bictegravir-Emtricitab-Tenofov (Biktarvy )   Patient requested (Patient-Rptd) Delivery   Delivery date: (Patient-Rptd) 07/31/23   Verified address: (Patient-Rptd) [address] [place] [address]   Medication will be filled on 08/03/23.   Patient was left a voice mail that the requested delivery date is too soon per insurance. New delivery date is 08/04/23. Patient was instructed to contact the pharmacy should he have any questions or concerns.

## 2023-07-29 ENCOUNTER — Other Ambulatory Visit: Payer: Self-pay

## 2023-07-29 NOTE — Progress Notes (Signed)
 Patient is possibly switching provider/pharmacies. Requested we cancel fill. Will call back if he needs to fill with us .

## 2023-08-19 ENCOUNTER — Other Ambulatory Visit: Payer: Self-pay

## 2023-08-20 ENCOUNTER — Other Ambulatory Visit: Payer: Self-pay

## 2023-08-20 NOTE — Progress Notes (Signed)
 Disenrolling- per previous encounter note patient planning to switch pharmacies and knows to call if they would like to fill here.

## 2023-08-24 ENCOUNTER — Other Ambulatory Visit: Payer: Self-pay

## 2023-08-24 NOTE — Progress Notes (Signed)
 Cardiology Office Note  Date:  08/25/2023   ID:  Daniel Black, DOB [birth year], MRN [id number]  PCP:  Eartha Gold, MD   Chief Complaint  Patient presents with   Shortness of Breath    Patient c/o shortness of breath with walking about 1 mile.     HPI:  Mr. Daniel Black is a very pleasant 63 year old gentleman with  remote smoking history,  coronary artery disease,  MI in 2004 with PCI to the LAD and mid RCA at that time,  PCI in 2010 of the mid RCA lesion,  hyperlipidemia,  HIV, on antivirals  Chronic renal insufficiency creatinine 1.5 Catheterization PCI August 2019 2 stents placed to mid left circumflex Repeat catheterization March 2022 medical management recommended who presents for routine follow up of his coronary artery disease.  Last seen by myself in clinic 8/24 Chronic shortness of breath symptoms Having shortness of breath with walking 1 mile Reports he has been hiking, doing trails Recently rejoined the gym  Off metoprolol , no benefit with breathing or tachycardia  Denies chest pain concerning for angina  Asymptomatic PVCs, rare palpitations  Reports he took himself off Zetia  and Crestor , tried over-the-counter supplement, Reports his cholesterol went much higher so put himself back on Crestor  and Zetia   Prior lab work numbers Total cholesterol 89 LDL 29 Creatinine 1.5 BUN 28  EKG personally reviewed by myself on todays visit EKG Interpretation Date/Time:  Tuesday Aug 25 2023 15:01:24 EDT Ventricular Rate:  66 PR Interval:  176 QRS Duration:  88 QT Interval:  380 QTC Calculation: 398 R Axis:   -15  Text Interpretation: Sinus rhythm with occasional Premature ventricular complexes When compared with ECG of 07-Nov-2022 13:42, No significant change was found Confirmed by Belva Boyden [phone]) on 08/25/2023 3:15:25 PM   Results reviewed Catheterization March 2022 Patent stents LAD, circumflex, RCA Mid to distal LAD with moderate disease,  tapers significantly from proximal to mid region, small caliber vessel mid to distal Normal ejection fraction, no significant aortic valve stenosis No significant change compared to prior catheterization 2019, unable to exclude slight progression of mid LAD disease  STEMI 11/2017 Stent to the distal RCA and posterior AV groove segment Status post staged PCI to the LCx  11/19/2017., second stent placed for suspected edge dissection  Cardiac catheterization report from April 2010 details patent LAD stent, 30% distal LAD, 25% proximal left circumflex, 20% mid circumflex, 95% mid RCA, 40% distal RCA, 30% right PDA. Xience 3.5 x 28 mm DES stent placed to the mid RCA.     PMH:   has a past medical history of Asymptomatic HIV infection (HCC), Bell's palsy (2004), CAD (coronary artery disease), DDD (degenerative disc disease) (10/02), Erectile dysfunction, HLD (hyperlipidemia), Myocardial infarction (HCC) (2004), Nephrolithiasis, and Skin cancer, basal cell.  PSH:    Past Surgical History:  Procedure Laterality Date   AMPUTATION  1985   L fingers reattachement    back fusion     lumbar   CARDIAC CATHETERIZATION     x2. 2004, 2010. 3 stents(total)   CHOLECYSTECTOMY  3/03   COLONOSCOPY WITH PROPOFOL  N/A 06/13/2016   Procedure: COLONOSCOPY WITH PROPOFOL ;  Surgeon: Marnee Sink, MD;  Location: [facility];  Service: Endoscopy;  Laterality: N/A;   COLONOSCOPY WITH PROPOFOL  N/A 07/01/2021   Procedure: COLONOSCOPY WITH PROPOFOL ;  Surgeon: Marnee Sink, MD;  Location: [facility];  Service: Endoscopy;  Laterality: N/A;   CORONARY/GRAFT ACUTE MI REVASCULARIZATION N/A 11/16/2017   Procedure: Coronary/Graft Acute MI  Revascularization;  Surgeon: Wenona Hamilton, MD;  Location: [facility] INVASIVE CV LAB;  Service: Cardiovascular;  Laterality: N/A;   LEFT HEART CATH AND CORONARY ANGIOGRAPHY N/A 11/16/2017   Procedure: LEFT HEART CATH AND CORONARY ANGIOGRAPHY;  Surgeon: Wenona Hamilton, MD;  Location:  [facility] INVASIVE CV LAB;  Service: Cardiovascular;  Laterality: N/A;   LEFT HEART CATH AND CORONARY ANGIOGRAPHY N/A 11/19/2017   Procedure: LEFT HEART CATH AND CORONARY ANGIOGRAPHY;  Surgeon: Wenona Hamilton, MD;  Location: [facility] INVASIVE CV LAB;  Service: Cardiovascular;  Laterality: N/A;   LEFT HEART CATH AND CORONARY ANGIOGRAPHY Left 06/14/2020   Procedure: LEFT HEART CATH AND CORONARY ANGIOGRAPHY;  Surgeon: Devorah Fonder, MD;  Location: [facility] INVASIVE CV LAB;  Service: Cardiovascular;  Laterality: Left;   POLYPECTOMY  06/13/2016   Procedure: POLYPECTOMY;  Surgeon: Marnee Sink, MD;  Location: [facility];  Service: Endoscopy;;   POLYPECTOMY  07/01/2021   Procedure: POLYPECTOMY;  Surgeon: Marnee Sink, MD;  Location: [facility];  Service: Endoscopy;;   WRIST SURGERY  10/2011    Current Outpatient Medications  Medication Sig Dispense Refill   acetaminophen  (TYLENOL ) 325 MG tablet Take 650 mg by mouth every 6 (six) hours as needed for moderate pain (pain score 4-6) or headache.     aspirin  EC 81 MG tablet Take 81 mg by mouth in the morning and at bedtime. Swallow whole.     bictegravir-emtricitabine -tenofovir  AF (BIKTARVY ) 50-200-25 MG TABS tablet Take 1 tablet by mouth once daily 30 tablet 11   cyclobenzaprine  (FLEXERIL ) 5 MG tablet Take 1 tablet (5 mg total) by mouth 3 (three) times daily as needed. 30 tablet 5   diphenhydramine-acetaminophen  (TYLENOL  PM) 25-500 MG TABS tablet Take 1 tablet by mouth at bedtime as needed (sleep).     ezetimibe  (ZETIA ) 10 MG tablet Take 1 tablet (10 mg total) by mouth daily. PLEASE CALL OFFICE TO SCHEDULE APPOINTMENT PRIOR TO NEXT REFILL (1st attempt) 90 tablet 3   fluticasone (FLONASE) 50 MCG/ACT nasal spray Place 1 spray into both nostrils daily as needed for allergies or rhinitis.     isosorbide  mononitrate (IMDUR ) 30 MG 24 hr tablet Take 1 tablet (30 mg total) by mouth daily. PLEASE CALL OFFICE TO SCHEDULE APPOINTMENT PRIOR TO NEXT REFILL 90  tablet 3   nitroGLYCERIN  (NITROSTAT ) 0.4 MG SL tablet Place 1 tablet (0.4 mg total) under the tongue every 5 (five) minutes as needed for chest pain. 25 tablet 0   rosuvastatin  (CRESTOR ) 40 MG tablet Take 1 tablet (40 mg total) by mouth daily. PLEASE CALL OFFICE TO SCHEDULE APPOINTMENT PRIOR TO NEXT REFILL 90 tablet 3   bictegravir-emtricitabine -tenofovir  AF (BIKTARVY ) 50-200-25 MG TABS tablet Take 1 tablet by mouth once daily (Patient not taking: Reported on 08/25/2023) 30 tablet 11   dicyclomine  (BENTYL ) 20 MG tablet Take 1 tablet (20 mg total) by mouth 3 (three) times daily before meals. (Patient not taking: Reported on 08/25/2023) 20 tablet 0   metoprolol  tartrate (LOPRESSOR ) 25 MG tablet Take 1 tablet (25 mg total) by mouth 2 (two) times daily as needed (for palpitations). (Patient not taking: Reported on 08/25/2023) 180 tablet 3   No current facility-administered medications for this visit.    Allergies:   Patient has no known allergies.   Social History:  The patient  reports that he quit smoking about 13 years ago. His smoking use included cigarettes. He has never used smokeless tobacco. He reports that he does not drink alcohol and does not use drugs.  Family History:   family history includes Cancer in his father; Coronary artery disease in his father and another family member; Hearing loss in his father and mother; Heart disease in his father and mother; Hypertension in his father and mother; Kidney disease in his father; Stroke in his father; Varicose Veins in his father; Vision loss in his father.   Review of Systems: Review of Systems  Constitutional: Negative.   HENT: Negative.    Respiratory:  Positive for shortness of breath.   Cardiovascular:  Positive for palpitations.  Gastrointestinal: Negative.   Musculoskeletal: Negative.   Neurological: Negative.   Psychiatric/Behavioral: Negative.    All other systems reviewed and are negative.  PHYSICAL EXAM: VS:  BP 130/80 (BP  Location: Left Arm, Patient Position: Sitting, Cuff Size: Normal)   Pulse 66   Ht 5\' 10"  (1.778 m)   Wt 199 lb (90.3 kg)   SpO2 98%   BMI 28.55 kg/m  , BMI Body mass index is 28.55 kg/m.  Constitutional:  oriented to person, place, and time. No distress.  HENT:  Head: Grossly normal Eyes:  no discharge. No scleral icterus.  Neck: No JVD, no carotid bruits  Cardiovascular: Regular rate and rhythm, no murmurs appreciated Pulmonary/Chest: Clear to auscultation bilaterally, no wheezes or rales Abdominal: Soft.  no distension.  no tenderness.  Musculoskeletal: Normal range of motion Neurological:  normal muscle tone. Coordination normal. No atrophy Skin: Skin warm and dry Psychiatric: normal affect, pleasant  Recent Labs: No results found for requested labs within last 365 days.    Lipid Panel Lab Results  Component Value Date   CHOL 86 07/03/2020   HDL 49 07/03/2020   LDLCALC 28 07/03/2020   TRIG 43 07/03/2020     Wt Readings from Last 3 Encounters:  08/25/23 199 lb (90.3 kg)  11/07/22 198 lb 2 oz (89.9 kg)  08/15/22 190 lb (86.2 kg)     ASSESSMENT AND PLAN:  Atherosclerosis of native coronary artery with chronic stable angina cardiac catheterization March 2022, patent stents, 50% mid LAD disease Normal LV function on echo July 2023 -Chronic shortness of breath symptoms -Recommend if symptoms get worse he call us , Myoview  could be ordered -Recommend he restart working out at [employer] he stay on Zetia , Crestor  - Reports he is no longer on metoprolol , did not help with shortness of breath or fast heart rate feeling  Shortness of breath Chronic issue, recommend he restart exercise at the gym for conditioning Off Brilinta , on aspirin  alone - For worsening symptoms could consider Myoview   Mixed hyperlipidemia Reports he held the medication for short period of time, numbers went higher Back on Crestor  and Zetia   Human immunodeficiency virus (HIV) disease  (HCC) Managed by ID antivirals  PVCs Low PVC burden on prior monitor Relatively asymptomatic Not taking metoprolol     Orders Placed This Encounter  Procedures   EKG 12-Lead     Signed, Juanda Noon, M.D., Ph.D. 08/25/2023  [facility] [place], [place] [phone]

## 2023-08-25 ENCOUNTER — Ambulatory Visit: Attending: Cardiovascular Disease | Admitting: Cardiovascular Disease

## 2023-08-25 ENCOUNTER — Encounter: Payer: Self-pay | Admitting: Cardiovascular Disease

## 2023-08-25 ENCOUNTER — Other Ambulatory Visit: Payer: Self-pay

## 2023-08-25 VITALS — BP 130/80 | HR 66 | Ht 70.0 in | Wt 199.0 lb

## 2023-08-25 DIAGNOSIS — I25118 Atherosclerotic heart disease of native coronary artery with other forms of angina pectoris: Secondary | ICD-10-CM

## 2023-08-25 DIAGNOSIS — B20 Human immunodeficiency virus [HIV] disease: Secondary | ICD-10-CM | POA: Diagnosis not present

## 2023-08-25 DIAGNOSIS — K219 Gastro-esophageal reflux disease without esophagitis: Secondary | ICD-10-CM

## 2023-08-25 DIAGNOSIS — R079 Chest pain, unspecified: Secondary | ICD-10-CM | POA: Diagnosis not present

## 2023-08-25 DIAGNOSIS — E785 Hyperlipidemia, unspecified: Secondary | ICD-10-CM

## 2023-08-25 DIAGNOSIS — R0602 Shortness of breath: Secondary | ICD-10-CM

## 2023-08-25 DIAGNOSIS — N183 Chronic kidney disease, stage 3 unspecified: Secondary | ICD-10-CM

## 2023-08-25 DIAGNOSIS — I493 Ventricular premature depolarization: Secondary | ICD-10-CM

## 2023-08-25 MED ORDER — EZETIMIBE 10 MG PO TABS
10.0000 mg | ORAL_TABLET | Freq: Every day | ORAL | 3 refills | Status: DC
  Filled 2023-08-25: qty 90, 90d supply, fill #0
  Filled 2023-09-24: qty 30, 30d supply, fill #0
  Filled 2023-10-23: qty 30, 30d supply, fill #1
  Filled 2023-11-18: qty 30, 30d supply, fill #2
  Filled 2023-12-20: qty 30, 30d supply, fill #3
  Filled 2024-01-17: qty 30, 30d supply, fill #4
  Filled 2024-02-15: qty 30, 30d supply, fill #5
  Filled 2024-03-14 – 2024-03-15 (×2): qty 30, 30d supply, fill #6
  Filled 2024-04-18: qty 30, 30d supply, fill #7

## 2023-08-25 MED ORDER — ROSUVASTATIN CALCIUM 40 MG PO TABS
40.0000 mg | ORAL_TABLET | Freq: Every day | ORAL | 3 refills | Status: DC
  Filled 2023-08-25: qty 90, 90d supply, fill #0
  Filled 2023-12-20: qty 30, 30d supply, fill #0
  Filled 2024-01-17: qty 30, 30d supply, fill #1
  Filled 2024-02-15: qty 30, 30d supply, fill #2
  Filled 2024-03-14 – 2024-03-15 (×2): qty 30, 30d supply, fill #3
  Filled 2024-04-18: qty 30, 30d supply, fill #4

## 2023-08-25 MED ORDER — ISOSORBIDE MONONITRATE ER 30 MG PO TB24
30.0000 mg | ORAL_TABLET | Freq: Every day | ORAL | 3 refills | Status: DC
  Filled 2023-08-25: qty 90, 90d supply, fill #0
  Filled 2023-09-24: qty 30, 30d supply, fill #0
  Filled 2023-10-23: qty 30, 30d supply, fill #1
  Filled 2023-11-18 (×2): qty 30, 30d supply, fill #2
  Filled 2023-12-20: qty 30, 30d supply, fill #3
  Filled 2024-01-17: qty 30, 30d supply, fill #4
  Filled 2024-02-15: qty 30, 30d supply, fill #5
  Filled 2024-03-14 – 2024-03-15 (×2): qty 30, 30d supply, fill #6
  Filled 2024-04-18: qty 30, 30d supply, fill #7

## 2023-08-25 NOTE — Patient Instructions (Signed)

## 2023-09-24 ENCOUNTER — Other Ambulatory Visit: Payer: Self-pay

## 2023-09-24 ENCOUNTER — Other Ambulatory Visit (HOSPITAL_COMMUNITY): Payer: Self-pay

## 2023-09-24 DIAGNOSIS — R030 Elevated blood-pressure reading, without diagnosis of hypertension: Secondary | ICD-10-CM | POA: Diagnosis not present

## 2023-09-24 DIAGNOSIS — Z87891 Personal history of nicotine dependence: Secondary | ICD-10-CM | POA: Diagnosis not present

## 2023-09-24 DIAGNOSIS — I252 Old myocardial infarction: Secondary | ICD-10-CM | POA: Diagnosis not present

## 2023-09-24 DIAGNOSIS — B20 Human immunodeficiency virus [HIV] disease: Secondary | ICD-10-CM | POA: Diagnosis not present

## 2023-09-24 DIAGNOSIS — E785 Hyperlipidemia, unspecified: Secondary | ICD-10-CM | POA: Diagnosis not present

## 2023-09-24 DIAGNOSIS — Z79899 Other long term (current) drug therapy: Secondary | ICD-10-CM | POA: Diagnosis not present

## 2023-09-24 DIAGNOSIS — I25119 Atherosclerotic heart disease of native coronary artery with unspecified angina pectoris: Secondary | ICD-10-CM | POA: Diagnosis not present

## 2023-09-24 DIAGNOSIS — N189 Chronic kidney disease, unspecified: Secondary | ICD-10-CM | POA: Diagnosis not present

## 2023-09-28 ENCOUNTER — Other Ambulatory Visit: Payer: Self-pay

## 2023-10-23 ENCOUNTER — Other Ambulatory Visit (HOSPITAL_COMMUNITY): Payer: Self-pay

## 2023-11-18 ENCOUNTER — Other Ambulatory Visit (HOSPITAL_COMMUNITY): Payer: Self-pay

## 2023-11-23 DIAGNOSIS — Z21 Asymptomatic human immunodeficiency virus [HIV] infection status: Secondary | ICD-10-CM | POA: Diagnosis not present

## 2023-12-21 ENCOUNTER — Encounter: Payer: Self-pay | Admitting: Cardiovascular Disease

## 2023-12-21 ENCOUNTER — Other Ambulatory Visit (HOSPITAL_COMMUNITY): Payer: Self-pay

## 2023-12-21 NOTE — Telephone Encounter (Signed)
 Called patient regarding MyChart message. Patient stated he has been experiencing intermittent chest tightness, dizziness, and shortness of breath while climbing a small hill on the trail. He reported that the symptoms occur every time, and rest usually helps them resolve. However, the patient noted he feels extremely fatigued and needs to rest afterward. He denies symptoms currently.  An appointment is scheduled for tomorrow, 12/22/23, for further evaluation. The patient was made aware of ED precautions should any new symptoms develop or worsen.

## 2023-12-22 ENCOUNTER — Ambulatory Visit: Attending: Medical | Admitting: Medical

## 2023-12-22 ENCOUNTER — Encounter: Payer: Self-pay | Admitting: Medical

## 2023-12-22 VITALS — BP 110/66 | HR 67 | Ht 70.0 in | Wt 192.0 lb

## 2023-12-22 DIAGNOSIS — E782 Mixed hyperlipidemia: Secondary | ICD-10-CM | POA: Diagnosis not present

## 2023-12-22 DIAGNOSIS — I493 Ventricular premature depolarization: Secondary | ICD-10-CM | POA: Diagnosis not present

## 2023-12-22 DIAGNOSIS — I25118 Atherosclerotic heart disease of native coronary artery with other forms of angina pectoris: Secondary | ICD-10-CM

## 2023-12-22 NOTE — Patient Instructions (Signed)
 Medication Instructions:  Your physician recommends that you continue on your current medications as directed. Please refer to the Current Medication list given to you today.    *If you need a refill on your cardiac medications before your next appointment, please call your pharmacy*  Lab Work: Your provider would like for you to have following labs drawn today CBC, BMP.     Testing/Procedures: Your physician has requested that you have a cardiac catheterization. Cardiac catheterization is used to diagnose and/or treat various heart conditions. Doctors may recommend this procedure for a number of different reasons. The most common reason is to evaluate chest pain. Chest pain can be a symptom of coronary artery disease (CAD), and cardiac catheterization can show whether plaque is narrowing or blocking your heart's arteries. This procedure is also used to evaluate the valves, as well as measure the blood flow and oxygen levels in different parts of your heart. For further information please visit [email or web address]  Please follow instruction list below   Follow-Up: At [facility], you and your health needs are our priority.  As part of our continuing mission to provide you with exceptional heart care, our providers are all part of one team.  This team includes your primary Cardiologist (physician) and Advanced Practice Providers or APPs (Physician Assistants and Nurse Practitioners) who all work together to provide you with the care you need, when you need it.  Your next appointment:   3 week(s)  Provider:   Evalene Lunger, MD or Cadence Franchester, PA-C     [facility] A DEPT OF [facility] AT [facility] [address] [place], [address] [place] [address] Dept: [phone] Loc: [phone]  Daniel Black  12/22/2023  You are scheduled for a Cardiac Catheterization on Monday, September 22 with Dr. Deatrice Cage.  1. Please arrive at the [facility] Entrance of [facility], [address] [place], [place] [address] at 8:30 AM (This is 1 hour(s) prior to your procedure time).  Proceed to the Check-In Desk directly inside the entrance.  Procedure Parking: Use the entrance off of the [address] side of the hospital. Turn right upon entering and follow the driveway to parking that is directly in front of the Heart [facility] Vascular Center. There is no valet parking available at this entrance, however there is an awning directly in front of the Heart & Vascular Center for drop off/ pick up for patients.  Special note: Every effort is made to have your procedure done on time. Please understand that emergencies sometimes delay scheduled procedures.  2. Diet: Nothing to eat after midnight.   3. Hydration: You need to be well hydrated before your procedure. On September 22, you may drink approved liquids (see below) until 2 hours before the procedure, with 16 oz of water  as your last intake.   List of approved liquids water , clear juice, clear tea, black coffee, fruit juices, non-citric and without pulp, carbonated beverages, Gatorade, Kool -Aid, plain Jello-O and plain ice popsicles.  4. Labs: You will need to have blood drawn today   5. Medication instructions in preparation for your procedure:   Contrast Allergy: No   On the morning of your procedure, take your Aspirin  81 mg and any morning medicines NOT listed above.  You may use sips of water .  6. Plan to go home the same day, you will only stay overnight if medically necessary. 7. Bring a current list of  your medications and current insurance cards. 8. You MUST have a responsible person to drive you home. 9. Someone MUST be with you the first 24 hours after you arrive home or your discharge will be delayed. 10. Please wear clothes that are easy to get on and off and wear slip-on shoes.  Thank you for allowing us  to care for you!   -- [facility]

## 2023-12-22 NOTE — Progress Notes (Signed)
 Cardiology Office Note   Date:  12/22/2023  ID:  Daniel MIDDLESWORTH, DOB Aug 30, 1960, MRN [id number] PCP: Epifanio Alm SQUIBB, MD  [facility] Providers Cardiologist:  Evalene Lunger, MD   History of Present Illness Daniel Black is a 63 y.o. male with a history of CAD with prior PCI, hyperlipidemia, HIV on antivirals, chronic renal insufficiency who presents for follow-up.  Patient has a history of CAD with MI 2004 with PCI to the LAD and mid RCA at that time, PCI in 2010 of the mid RCA lesion.  Cardiac catheterization in August 2019 with PCI/DES x 2 to the mid left circumflex.  Repeat heart cath in March 2022 recommended medical management.  Patient was last seen May 2025 reporting shortness of breath.  It was decided to continue to monitor symptoms.  Today, the patient reports he does a lot of hiking. He has been getting chest tightness and shortness of breath every time he walks. He has been taking Imdur . He dizziness and lightheadedness when he changes positions. Most recent Scr 1.5.  Studies Reviewed EKG Interpretation Date/Time:  Tuesday December 22 2023 08:19:36 EDT Ventricular Rate:  67 PR Interval:  172 QRS Duration:  84 QT Interval:  358 QTC Calculation: 378 R Axis:   -1  Text Interpretation: Normal sinus rhythm with sinus arrhythmia Inferior infarct (cited on or before 22-Dec-2023) When compared with ECG of 25-Aug-2023 15:01, Premature ventricular complexes are no longer Present Confirmed by Franchester, Lilyrose Tanney ([id number]) on 12/22/2023 8:26:26 AM    Echo 10/2021 1. Left ventricular ejection fraction, by estimation, is 55 to 60%. Left  ventricular ejection fraction by 2D MOD biplane is 58.6 %. The left  ventricle has normal function. The left ventricle has no regional wall  motion abnormalities. Left ventricular  diastolic parameters were normal.   2. Right ventricular systolic function is normal. The right ventricular  size is normal.   3. The mitral valve is normal  in structure. No evidence of mitral valve  regurgitation.   4. The aortic valve is normal in structure. Aortic valve regurgitation is  not visualized.   5. Aortic dilatation noted. There is mild dilatation of the aortic root,  measuring 39 mm.   6. The inferior vena cava is normal in size with <50% respiratory  variability, suggesting right atrial pressure of 8 mmHg.    Heart monitor 10/2021 Event Monitor Patch Wear Time:  12 days and 19 hours (2023-06-20T16:30:07-398 to 2023-07-03T12:25:25-0400)   Normal sinus rhythm Patient had a min HR of 46 bpm, max HR of 146 bpm, and avg HR of 76 bpm.    Isolated SVEs were rare (<1.0%), SVE Couplets were rare (<1.0%), and SVE Triplets were rare (<1.0%).  Isolated VEs were occasional (1.1%, 15660), VE Couplets were rare (<1.0%, 23), and no VE Triplets were present. Ventricular Bigeminy was present.    Patient triggered events associated with normal sinus rhythm, rare PVCs   Signed, Velinda Lunger, MD, Ph.D [facility]  LHC 2022 Prox LAD to Mid LAD lesion is 10% stenosed. Previously placed Prox Cx to Mid Cx drug eluting stent is widely patent. Balloon angioplasty was performed. Prox RCA to Mid RCA lesion is 20% stenosed. Mid RCA to Dist RCA lesion is 30% stenosed. Mid RCA lesion is 30% stenosed. Non-stenotic Dist RCA lesion was previously treated. Ost RPDA to RPDA lesion is 95% stenosed. Non-stenotic RPAV-1 lesion was previously treated. RPAV-2 lesion is 100% stenosed. Mid LAD lesion is 50% stenosed with 0% stenosed side branch  in [place] 2nd Sept.   LHC 2019   Prox LAD to Mid LAD lesion is 10% stenosed. Mid LAD lesion is 30% stenosed. Prox Cx to Mid Cx lesion is 95% stenosed. Prox RCA to Mid RCA lesion is 20% stenosed. Mid RCA to Dist RCA lesion is 30% stenosed. Mid RCA lesion is 30% stenosed. Previously placed Dist RCA drug eluting stent is widely patent. Balloon angioplasty was performed. Ost RPDA to RPDA lesion is 95%  stenosed. Previously placed Post Atrio-1 drug eluting stent is widely patent. Balloon angioplasty was performed. Post Atrio-2 lesion is 100% stenosed. Post intervention, there is a 0% residual stenosis. A drug-eluting stent was successfully placed using a STENT RESOLUTE ONYX [id number].   Successful angioplasty and drug-eluting stent placement to the mid left circumflex.  A second stent was overlapped proximally due to suspected edge dissection that was covered successfully with a second stent.   Recommendations:   Recommend dual antiplatelet therapy with Aspirin  81mg  daily and Clopidogrel  75mg  daily long-term (beyond 12 months) because of Multiple stents..    Likely discharge home tomorrow if no complications.      Physical Exam VS:  BP 110/66   Pulse 67   Ht 5' 10 (1.778 m)   Wt 192 lb (87.1 kg)   SpO2 96%   BMI 27.55 kg/m        Wt Readings from Last 3 Encounters:  12/22/23 192 lb (87.1 kg)  08/25/23 199 lb (90.3 kg)  11/07/22 198 lb 2 oz (89.9 kg)    GEN: Well nourished, well developed in no acute distress NECK: No JVD; No carotid bruits CARDIAC: RRR, no murmurs, rubs, gallops RESPIRATORY:  Clear to auscultation without rales, wheezing or rhonchi  ABDOMEN: Soft, non-tender, non-distended EXTREMITIES:  No edema; No deformity   ASSESSMENT AND PLAN  Unstable angina CAD with prior stenting The patient reports persistent chest tightness and shortness of breath when he walks uphill.  EKG today with no ischemic changes.  Patient has not been taking sublingual nitro, but Imdur  every day.  After a discussion we agreed on pursuing a left heart cath.  We will see him back 2 weeks after this procedure. Continue ASA, Zetia , Imdur  and Crestor . Will need to check kidney function at follow-up.  HLD LDL 29. Continue Zetia  and Crestor .   PVCs Low burden on prior monitor    Informed Consent   Shared Decision Making/Informed Consent The risks [stroke (1 in 1000), death (1 in 1000),  kidney failure [usually temporary] (1 in 500), bleeding (1 in 200), allergic reaction [possibly serious] (1 in 200)], benefits (diagnostic support and management of coronary artery disease) and alternatives of a cardiac catheterization were discussed in detail with Mr. Swamy and he is willing to proceed.     Dispo: Follow-up in 3 weeks  Signed, Egbert Seidel VEAR Fishman, PA-C

## 2023-12-23 ENCOUNTER — Ambulatory Visit: Payer: Self-pay | Admitting: Medical

## 2023-12-23 LAB — CBC
Hematocrit: 48.1 % (ref 37.5–51.0)
Hemoglobin: 16.3 g/dL (ref 13.0–17.7)
MCH: 33.1 pg — ABNORMAL HIGH (ref 26.6–33.0)
MCHC: 33.9 g/dL (ref 31.5–35.7)
MCV: 98 fL — ABNORMAL HIGH (ref 79–97)
Platelets: 211 x10E3/uL (ref 150–450)
RBC: 4.93 x10E6/uL (ref 4.14–5.80)
RDW: 12.8 % (ref 11.6–15.4)
WBC: 6.3 x10E3/uL (ref 3.4–10.8)

## 2023-12-23 LAB — BASIC METABOLIC PANEL WITH GFR
BUN/Creatinine Ratio: 13 (ref 10–24)
BUN: 21 mg/dL (ref 8–27)
CO2: 24 mmol/L (ref 20–29)
Calcium: 9.7 mg/dL (ref 8.6–10.2)
Chloride: 103 mmol/L (ref 96–106)
Creatinine, Ser: 1.63 mg/dL — ABNORMAL HIGH (ref 0.76–1.27)
Glucose: 61 mg/dL — ABNORMAL LOW (ref 70–99)
Potassium: 4.8 mmol/L (ref 3.5–5.2)
Sodium: 142 mmol/L (ref 134–144)
eGFR: 47 mL/min/1.73 — ABNORMAL LOW (ref >59)

## 2023-12-24 ENCOUNTER — Telehealth: Payer: Self-pay | Admitting: Cardiovascular Disease

## 2023-12-24 NOTE — Telephone Encounter (Signed)
 Patient insurance has denied his cath. Patient calling to see what is the next step. Please advise

## 2023-12-25 NOTE — Telephone Encounter (Signed)
 Please see update phone encounter

## 2023-12-25 NOTE — Telephone Encounter (Signed)
 Attempted to contact pt. Left message to call back.  Cath scheduled for 12/28/23 cancelled

## 2023-12-25 NOTE — Telephone Encounter (Signed)
 Pt made aware cath scheduled for 12/28/23 has been cancelled and our prior auth department has submitted an appeal. Pt made aware we will contact him with an update. Pt verbalized understanding.

## 2023-12-28 ENCOUNTER — Encounter: Admission: RE | Payer: Self-pay | Source: Home / Self Care

## 2023-12-28 ENCOUNTER — Ambulatory Visit: Admission: RE | Admit: 2023-12-28 | Source: Home / Self Care | Admitting: Cardiovascular Disease

## 2023-12-28 DIAGNOSIS — R079 Chest pain, unspecified: Secondary | ICD-10-CM

## 2023-12-28 SURGERY — LEFT HEART CATH AND CORONARY ANGIOGRAPHY
Anesthesia: Moderate Sedation | Laterality: Left

## 2024-01-12 ENCOUNTER — Encounter: Payer: Self-pay | Admitting: Medical

## 2024-01-12 ENCOUNTER — Ambulatory Visit: Attending: Medical | Admitting: Medical

## 2024-01-12 ENCOUNTER — Ambulatory Visit

## 2024-01-12 VITALS — BP 116/86 | HR 74 | Ht 71.0 in | Wt 185.4 lb

## 2024-01-12 DIAGNOSIS — Z79899 Other long term (current) drug therapy: Secondary | ICD-10-CM | POA: Diagnosis not present

## 2024-01-12 DIAGNOSIS — R0602 Shortness of breath: Secondary | ICD-10-CM | POA: Diagnosis not present

## 2024-01-12 DIAGNOSIS — R42 Dizziness and giddiness: Secondary | ICD-10-CM

## 2024-01-12 DIAGNOSIS — E782 Mixed hyperlipidemia: Secondary | ICD-10-CM | POA: Diagnosis not present

## 2024-01-12 DIAGNOSIS — I493 Ventricular premature depolarization: Secondary | ICD-10-CM

## 2024-01-12 DIAGNOSIS — R0789 Other chest pain: Secondary | ICD-10-CM | POA: Diagnosis not present

## 2024-01-12 DIAGNOSIS — I25110 Atherosclerotic heart disease of native coronary artery with unstable angina pectoris: Secondary | ICD-10-CM | POA: Diagnosis not present

## 2024-01-12 NOTE — Patient Instructions (Signed)
 Medication Instructions:  Your physician recommends that you continue on your current medications as directed. Please refer to the Current Medication list given to you today.    *If you need a refill on your cardiac medications before your next appointment, please call your pharmacy*  Lab Work: Your provider would like for you to have following labs drawn today TSH, Mg, LPa.     Testing/Procedures: Your physician has requested that you have an echocardiogram. Echocardiography is a painless test that uses sound waves to create images of your heart. It provides your doctor with information about the size and shape of your heart and how well your heart's chambers and valves are working.   You may receive an ultrasound enhancing agent through an IV if needed to better visualize your heart during the echo. This procedure takes approximately one hour.  There are no restrictions for this procedure.  This will take place at [address] [facility] [address]) [address], [place] [address]  Please note: We ask at that you not bring children with you during ultrasound (echo/ vascular) testing. Due to room size and safety concerns, children are not allowed in the ultrasound rooms during exams. Our front office staff cannot provide observation of children in our lobby area while testing is being conducted. An adult accompanying a patient to their appointment will only be allowed in the ultrasound room at the discretion of the ultrasound technician under special circumstances. We apologize for any inconvenience.   Your physician has requested that you have a carotid duplex. This test is an ultrasound of the carotid arteries in your neck. It looks at blood flow through these arteries that supply the brain with blood.   Allow one hour for this exam.  There are no restrictions or special instructions.  This will take place at [address] [facility] [address]) [address], [place] [address]  Please  note: We ask at that you not bring children with you during ultrasound (echo/ vascular) testing. Due to room size and safety concerns, children are not allowed in the ultrasound rooms during exams. Our front office staff cannot provide observation of children in our lobby area while testing is being conducted. An adult accompanying a patient to their appointment will only be allowed in the ultrasound room at the discretion of the ultrasound technician under special circumstances. We apologize for any inconvenience.   ZIO XT- Long Term Monitor Instructions  Your physician has requested you wear a ZIO patch monitor for 14 days.  This is a single patch monitor. [employer] supplies one patch monitor per enrollment. Additional stickers are not available. Please do not apply patch if you will be having a Nuclear Stress Test, Echocardiogram, Cardiac CT, MRI, or Chest Xray during the period you would be wearing the monitor. The patch cannot be worn during these tests. You cannot remove and re-apply the ZIO XT patch monitor.  Your ZIO patch monitor will be mailed 3 day USPS to your address on file. It may take 3-5 days to receive your monitor after you have been enrolled. Once you have received your monitor, please review the enclosed instructions. Your monitor has already been registered assigning a specific monitor serial number to you.  Billing and Patient Assistance Program Information  We have supplied [employer] with any of your insurance information on file for billing purposes.  [employer] offers a sliding scale Patient Assistance Program for patients that do not have insurance, or whose insurance does not completely cover the cost  of the ZIO monitor.  You must apply for the Patient Assistance Program to qualify for this discounted rate.  To apply, please call [employer] at [phone], select option 4, select option 2, ask to apply for Patient Assistance Program. Meredeth will ask your household income, and how many  people are in your household. They will quote your out-of-pocket cost based on that information. [employer] will also be able to set up a 49-month, interest-free payment plan if needed.  Applying the monitor   Shave hair from upper left chest.  Hold abrader disc by orange tab. Rub abrader in 40 strokes over the upper left chest as indicated in your monitor instructions.  Clean area with 4 enclosed alcohol pads. Let dry.  Apply patch as indicated in monitor instructions. Patch will be placed under collarbone on left side of chest with arrow pointing upward.  Rub patch adhesive wings for 2 minutes. Remove white label marked 1. Remove the white label marked 2. Rub patch adhesive wings for 2 additional minutes.  While looking in a mirror, press and release button in center of patch. A small green light will flash 3-4 times. This will be your only indicator that the monitor has been turned on.  Do not shower for the first 24 hours. You may shower after the first 24 hours.  Press the button if you feel a symptom. You will hear a small click. Record Date, Time and Symptom in the Patient Logbook.  When you are ready to remove the patch, follow instructions on the last 2 pages of Patient Logbook.  Stick patch monitor into the tabs at the bottom of the return box.  Place Patient Logbook in the blue and white box. Use locking tab on box and tape box closed securely. The blue and white box has prepaid postage on it. Please place it in the mailbox as soon as possible. Your physician should have your test results approximately 7-14 days after the monitor has been mailed back to [facility].  Call [facility] Customer Care at [phone] if you have questions regarding your ZIO XT patch monitor.  Call them immediately if you see an orange light blinking on your monitor.  If your monitor falls off in less than 4 days, contact our Monitor department at [phone].  If your monitor becomes loose or falls  off after 4 days call [employer] at [phone] for suggestions on securing your monitor.   Follow-Up: At [facility], you and your health needs are our priority.  As part of our continuing mission to provide you with exceptional heart care, our providers are all part of one team.  This team includes your primary Cardiologist (physician) and Advanced Practice Providers or APPs (Physician Assistants and Nurse Practitioners) who all work together to provide you with the care you need, when you need it.  Your next appointment:   1 month(s)  Provider:   Timothy Gollan, MD or Cadence Franchester, PA-C

## 2024-01-12 NOTE — Progress Notes (Addendum)
 Cardiology Office Note   Date:  01/12/2024  ID:  Daniel Black, DOB 1960/09/14, MRN [id number] PCP: Epifanio Alm SQUIBB, MD  [facility] Providers Cardiologist:  Evalene Lunger, MD   History of Present Illness Daniel Black is a 63 y.o. male with a history of CAD with prior PCI, hyperlipidemia, HIV on antivirals, chronic renal insufficiency who presents for follow-up of chest pain.   Patient has a history of CAD with MI 2004 with PCI to the LAD and mid RCA at that time, PCI in 2010 of the mid RCA lesion.  Cardiac catheterization in August 2019 with PCI/DES x 2 to the mid left circumflex.  Repeat heart cath in March 2022 recommended medical management.  The patient was last seen 12/22/23 reporting intermittent chest tightness and SOB. LHC was ordered, but insurance denied this, so Cardiac PET stress test was ordered.   Today, the patient reports there was an appeal for heart cath. He reports some chest pain and dizziness. He also gets chest pain and SOB when he walks. He feels dizzy all the time, it is worse at certain times.  Studies Reviewed EKG Interpretation Date/Time:  Tuesday January 12 2024 10:19:26 EDT Ventricular Rate:  67 PR Interval:  166 QRS Duration:  84 QT Interval:  358 QTC Calculation: 378 R Axis:   -5  Text Interpretation: Normal sinus rhythm Inferior infarct (cited on or before 22-Dec-2023) When compared with ECG of 22-Dec-2023 08:19, No significant change was found Confirmed by Franchester, Elih Mooney ([id number]) on 01/12/2024 10:24:19 AM      Echo 10/2021 1. Left ventricular ejection fraction, by estimation, is 55 to 60%. Left  ventricular ejection fraction by 2D MOD biplane is 58.6 %. The left  ventricle has normal function. The left ventricle has no regional wall  motion abnormalities. Left ventricular  diastolic parameters were normal.   2. Right ventricular systolic function is normal. The right ventricular  size is normal.   3. The mitral valve is normal  in structure. No evidence of mitral valve  regurgitation.   4. The aortic valve is normal in structure. Aortic valve regurgitation is  not visualized.   5. Aortic dilatation noted. There is mild dilatation of the aortic root,  measuring 39 mm.   6. The inferior vena cava is normal in size with <50% respiratory  variability, suggesting right atrial pressure of 8 mmHg.      Heart monitor 10/2021 Event Monitor Patch Wear Time:  12 days and 19 hours (2023-06-20T16:30:07-398 to 2023-07-03T12:25:25-0400)   Normal sinus rhythm Patient had a min HR of 46 bpm, max HR of 146 bpm, and avg HR of 76 bpm.    Isolated SVEs were rare (<1.0%), SVE Couplets were rare (<1.0%), and SVE Triplets were rare (<1.0%).  Isolated VEs were occasional (1.1%, 15660), VE Couplets were rare (<1.0%, 23), and no VE Triplets were present. Ventricular Bigeminy was present.    Patient triggered events associated with normal sinus rhythm, rare PVCs   Signed, Velinda Lunger, MD, Ph.D [facility]   LHC 2022 Prox LAD to Mid LAD lesion is 10% stenosed. Previously placed Prox Cx to Mid Cx drug eluting stent is widely patent. Balloon angioplasty was performed. Prox RCA to Mid RCA lesion is 20% stenosed. Mid RCA to Dist RCA lesion is 30% stenosed. Mid RCA lesion is 30% stenosed. Non-stenotic Dist RCA lesion was previously treated. Ost RPDA to RPDA lesion is 95% stenosed. Non-stenotic RPAV-1 lesion was previously treated. RPAV-2 lesion is 100% stenosed.  Mid LAD lesion is 50% stenosed with 0% stenosed side branch in Ost 2nd Sept.   LHC 2019    Prox LAD to Mid LAD lesion is 10% stenosed. Mid LAD lesion is 30% stenosed. Prox Cx to Mid Cx lesion is 95% stenosed. Prox RCA to Mid RCA lesion is 20% stenosed. Mid RCA to Dist RCA lesion is 30% stenosed. Mid RCA lesion is 30% stenosed. Previously placed Dist RCA drug eluting stent is widely patent. Balloon angioplasty was performed. Ost RPDA to RPDA lesion is 95%  stenosed. Previously placed Post Atrio-1 drug eluting stent is widely patent. Balloon angioplasty was performed. Post Atrio-2 lesion is 100% stenosed. Post intervention, there is a 0% residual stenosis. A drug-eluting stent was successfully placed using a STENT RESOLUTE ONYX [id number].   Successful angioplasty and drug-eluting stent placement to the mid left circumflex.  A second stent was overlapped proximally due to suspected edge dissection that was covered successfully with a second stent.   Recommendations:   Recommend dual antiplatelet therapy with Aspirin  81mg  daily and Clopidogrel  75mg  daily long-term (beyond 12 months) because of Multiple stents..    Likely discharge home tomorrow if no complications.     Physical Exam VS:  BP 116/86   Pulse 74   Ht 5' 11 (1.803 m)   Wt 185 lb 6.4 oz (84.1 kg)   SpO2 96%   BMI 25.86 kg/m        Wt Readings from Last 3 Encounters:  01/12/24 185 lb 6.4 oz (84.1 kg)  12/22/23 192 lb (87.1 kg)  08/25/23 199 lb (90.3 kg)    GEN: Well nourished, well developed in no acute distress NECK: No JVD; No carotid bruits CARDIAC: RRR, no murmurs, rubs, gallops RESPIRATORY:  Clear to auscultation without rales, wheezing or rhonchi  ABDOMEN: Soft, non-tender, non-distended EXTREMITIES:  No edema; No deformity   ASSESSMENT AND PLAN  Unstable Angina CAD with prior stenting Left heart cath ordered at the last visit, but insurance denied it.  There has been an appeal and decision will be next week.  Patient reports persistent chest tightness and shortness of breath on exertion.  I will check an echocardiogram.  Checking a stress test may be helpful, but if this is normal and he continues to have persistent symptoms and we will pursue left heart cath anyways.  Will continue aspirin , Zetia , Imdur  and Crestor .  HLD LDL 29.  I will check LP(a).  Continue Zetia  and Crestor .  Dizziness PVCs Patient reports constant dizziness.  Orthostatics today are  negative. I will check a 2-week heart monitor and carotid ultrasound. I will also update a TSH and Mag level. Heart monitor in 2023 showed PVCs.        Dispo: Follow-up in 1 month  Signed, Pallie Swigert VEAR Fishman, PA-C

## 2024-01-13 LAB — MAGNESIUM: Magnesium: 2.2 mg/dL (ref 1.6–2.3)

## 2024-01-13 LAB — TSH: TSH: 1.32 u[IU]/mL (ref 0.450–4.500)

## 2024-01-13 LAB — LIPOPROTEIN A (LPA): Lipoprotein (a): 13.1 nmol/L (ref <75.0)

## 2024-01-14 ENCOUNTER — Ambulatory Visit: Payer: Self-pay | Admitting: Medical

## 2024-01-18 ENCOUNTER — Other Ambulatory Visit (HOSPITAL_COMMUNITY): Payer: Self-pay

## 2024-02-04 ENCOUNTER — Other Ambulatory Visit (HOSPITAL_COMMUNITY): Payer: Self-pay

## 2024-02-04 ENCOUNTER — Other Ambulatory Visit: Payer: Self-pay

## 2024-02-04 DIAGNOSIS — R42 Dizziness and giddiness: Secondary | ICD-10-CM | POA: Diagnosis not present

## 2024-02-04 NOTE — Progress Notes (Signed)
 Specialty Pharmacy Refill Coordination Note  Daniel Black is a 64 y.o. male contacted today regarding refills of specialty medication(s) Bictegravir-Emtricitab-Tenofov (Biktarvy )   Patient requested Delivery   Delivery date: 02/08/24   Verified address: [address] [place] [address]   Medication will be filled on: 02/05/24  Patient was getting elsewhere-not off therapy-other pharmacy didn't send and asked us  to send this time [name] 02/04/24

## 2024-02-05 ENCOUNTER — Other Ambulatory Visit: Payer: Self-pay

## 2024-02-05 ENCOUNTER — Other Ambulatory Visit (HOSPITAL_COMMUNITY): Payer: Self-pay

## 2024-02-07 DIAGNOSIS — R42 Dizziness and giddiness: Secondary | ICD-10-CM

## 2024-02-15 ENCOUNTER — Other Ambulatory Visit: Payer: Self-pay

## 2024-02-23 ENCOUNTER — Other Ambulatory Visit: Payer: Self-pay

## 2024-02-23 NOTE — Progress Notes (Signed)
 Specialty Pharmacy Ongoing Clinical Assessment Note  Daniel Black is a 63 y.o. male who is being followed by the specialty pharmacy service for RxSp HIV   Patient's specialty medication(s) reviewed today: Bictegravir-Emtricitab-Tenofov (Biktarvy )   Missed doses in the last 4 weeks: 1   Patient/Caregiver did not have any additional questions or concerns.   Therapeutic benefit summary: Patient is achieving benefit   Adverse events/side effects summary: Experienced adverse events/side effects (shortness of breath, unsure if related, will discuss with provider)   Patient's therapy is appropriate to: Continue    Goals Addressed             This Visit's Progress    Achieve Undetectable HIV Viral Load < 20       Patient is unable to be assessed as patient is seen by an outside provider. Patient will maintain adherence         Follow up: 12 months  Silvano [name] Specialty Pharmacist

## 2024-02-25 ENCOUNTER — Ambulatory Visit: Attending: Medical

## 2024-02-25 ENCOUNTER — Ambulatory Visit (INDEPENDENT_AMBULATORY_CARE_PROVIDER_SITE_OTHER)

## 2024-02-25 DIAGNOSIS — R0602 Shortness of breath: Secondary | ICD-10-CM | POA: Diagnosis not present

## 2024-02-25 DIAGNOSIS — R0789 Other chest pain: Secondary | ICD-10-CM

## 2024-02-25 DIAGNOSIS — R42 Dizziness and giddiness: Secondary | ICD-10-CM

## 2024-02-25 DIAGNOSIS — I6521 Occlusion and stenosis of right carotid artery: Secondary | ICD-10-CM

## 2024-02-25 LAB — ECHOCARDIOGRAM COMPLETE
AR max vel: 2.89 cm2
AV Area VTI: 2.88 cm2
AV Area mean vel: 2.75 cm2
AV Mean grad: 5 mmHg
AV Peak grad: 9.9 mmHg
Ao pk vel: 1.57 m/s
Area-P 1/2: 3.72 cm2
S' Lateral: 3.1 cm

## 2024-03-01 ENCOUNTER — Ambulatory Visit: Attending: Medical | Admitting: Medical

## 2024-03-01 ENCOUNTER — Encounter: Payer: Self-pay | Admitting: Medical

## 2024-03-01 VITALS — BP 100/80 | HR 76 | Ht 71.0 in | Wt 164.6 lb

## 2024-03-01 DIAGNOSIS — E782 Mixed hyperlipidemia: Secondary | ICD-10-CM | POA: Diagnosis not present

## 2024-03-01 DIAGNOSIS — I25110 Atherosclerotic heart disease of native coronary artery with unstable angina pectoris: Secondary | ICD-10-CM

## 2024-03-01 DIAGNOSIS — R079 Chest pain, unspecified: Secondary | ICD-10-CM | POA: Diagnosis not present

## 2024-03-01 DIAGNOSIS — R42 Dizziness and giddiness: Secondary | ICD-10-CM

## 2024-03-01 NOTE — Patient Instructions (Signed)
 Medication Instructions:  Your physician recommends that you continue on your current medications as directed. Please refer to the Current Medication list given to you today.    *If you need a refill on your cardiac medications before your next appointment, please call your pharmacy*  Lab Work: No labs ordered today    Testing/Procedures:   Please report to Radiology at [facility] Main Entrance, medical mall, 30 mins prior to your test.  [address]  [place], [place]  How to Prepare for Your Cardiac PET/CT Stress Test:  Nothing to eat or drink, except water , 3 hours prior to arrival time.  NO caffeine/decaffeinated products, or chocolate 12 hours prior to arrival. (Please note decaffeinated beverages (teas/coffees) still contain caffeine).  If you have caffeine within 12 hours prior, the test will need to be rescheduled.  Medication instructions: Do not take erectile dysfunction medications for 72 hours prior to test (sildenafil, tadalafil) Do not take nitrates (isosorbide  mononitrate, Ranexa ) the day before or day of test Do not take tamsulosin the day before or morning of test Hold theophylline containing medications for 12 hours. Hold Dipyridamole 48 hours prior to the test.  Diabetic Preparation: If able to eat breakfast prior to 3 hour fasting, you may take all medications, including your insulin. Do not worry if you miss your breakfast dose of insulin - start at your next meal. If you do not eat prior to 3 hour fast-Hold all diabetes (oral and insulin) medications. Patients who wear a continuous glucose monitor MUST remove the device prior to scanning.  You may take your remaining medications with water .  NO perfume, cologne or lotion on chest or abdomen area. FEMALES - Please avoid wearing dresses to this appointment.  Total time is 1 to 2 hours; you may want to bring reading material for the waiting time.  IF YOU THINK YOU MAY BE  PREGNANT, OR ARE NURSING PLEASE INFORM THE TECHNOLOGIST.  In preparation for your appointment, medication and supplies will be purchased.  Appointment availability is limited, so if you need to cancel or reschedule, please call the Radiology Department Scheduler at [phone] 24 hours in advance to avoid a cancellation fee of $100.00  What to Expect When you Arrive:  Once you arrive and check in for your appointment, you will be taken to a preparation room within the Radiology Department.  A technologist or Nurse will obtain your medical history, verify that you are correctly prepped for the exam, and explain the procedure.  Afterwards, an IV will be started in your arm and electrodes will be placed on your skin for EKG monitoring during the stress portion of the exam. Then you will be escorted to the PET/CT scanner.  There, staff will get you positioned on the scanner and obtain a blood pressure and EKG.  During the exam, you will continue to be connected to the EKG and blood pressure machines.  A small, safe amount of a radioactive tracer will be injected in your IV to obtain a series of pictures of your heart along with an injection of a stress agent.    After your Exam:  It is recommended that you eat a meal and drink a caffeinated beverage to counter act any effects of the stress agent.  Drink plenty of fluids for the remainder of the day and urinate frequently for the first couple of hours after the exam.  Your doctor will inform you of your test results within 7-10 business days.  For more information and frequently asked questions, please visit our website: [email or web address]  For questions about your test or how to prepare for your test, please call: Cardiac Imaging Nurse Navigators Office: [phone]   Follow-Up: At [facility], you and your health needs are our priority.  As part of our continuing mission to provide you with exceptional heart care, our  providers are all part of one team.  This team includes your primary Cardiologist (physician) and Advanced Practice Providers or APPs (Physician Assistants and Nurse Practitioners) who all work together to provide you with the care you need, when you need it.  Your next appointment:   1 month(s)  Provider:   Mikey Fishman, PA-C

## 2024-03-01 NOTE — Progress Notes (Signed)
 Cardiology Office Note   Date:  03/01/2024  ID:  Daniel Black, DOB 1961-03-03, MRN [id number] PCP: Epifanio Alm SQUIBB, MD  [facility] Providers Cardiologist:  Evalene Lunger, MD  History of Present Illness Daniel Black is a 63 y.o. male with a history of CAD with prior PCI, hyperlipidemia, HIV on antivirals, chronic renal insufficiency who presents for follow-up of chest pain.   Patient has a history of CAD with MI 2004 with PCI to the LAD and mid RCA at that time, PCI in 2010 of the mid RCA lesion.  Cardiac catheterization in August 2019 with PCI/DES x 2 to the mid left circumflex.  Repeat heart cath in March 2022 recommended medical management.   The patient was seen 12/22/23 reporting intermittent chest tightness and SOB. LHC was ordered, but insurance denied this, so Cardiac PET stress test was ordered.   Today, the patient again denied the LHC appeal by Dr. Darron. He reports chest pains have improved, but they are still present. Chest pains are exertional, especially noticeable when he walks up a hill. He lost weight by changing his eating habits and exercising. He also has dizziness that is chronic and intermittent. BP is borderline today. He takes Imdur  at night.   Studies Reviewed      Carotid US  02/2024 Summary:  Right Carotid: Velocities in the right ICA are consistent with a 1-39%  stenosis.   Left Carotid: There is no evidence of stenosis in the left ICA.   Vertebrals:  Bilateral vertebral arteries demonstrate antegrade flow.  Subclavians: Normal flow hemodynamics were seen in bilateral subclavian               arteries.   *See table(s) above for measurements and observations.   Echo 02/2024 1. Left ventricular ejection fraction, by estimation, is 55 to 60%. Left  ventricular ejection fraction by 3D volume is 60 %. The left ventricle has  normal function. Left ventricular endocardial border not optimally defined  to evaluate regional wall   motion. Left ventricular diastolic parameters were normal. The average  left ventricular global longitudinal strain is -18.0 %. The global  longitudinal strain is normal.   2. Right ventricular systolic function is normal. The right ventricular  size is normal.   3. The mitral valve is normal in structure. No evidence of mitral valve  regurgitation. No evidence of mitral stenosis.   4. The aortic valve is tricuspid. Aortic valve regurgitation is not  visualized. No aortic stenosis is present.   5. Aortic dilatation noted. There is borderline dilatation of the aortic  root, measuring 39 mm.   6. The inferior vena cava is normal in size with greater than 50%  respiratory variability, suggesting right atrial pressure of 3 mmHg.   Heart monitor 02/2024 Event monitor Patch Wear Time:  12 days and 2 hours (2025-10-11T16:37:17-0400 to 2025-10-23T19:03:03-399)   Normal sinus rhythm Patient had a min HR of 42 bpm, max HR of 148 bpm, and avg HR of 68 bpm.    6 Supraventricular Tachycardia runs occurred, the run with the fastest interval lasting 5 beats with a max rate of 148 bpm, the longest lasting 11.6 secs with an avg rate of 120 bpm. Some episodes of Supraventricular Tachycardia may be possible Atrial Tachycardia with variable block.    Isolated SVEs were rare (<1.0%), SVE Couplets were rare (<1.0%), and SVE Triplets were rare (<1.0%).    Isolated VEs were rare (<1.0%), VE Couplets were rare (<1.0%), and no VE  Triplets were present. Ventricular Trigeminy was present.   Patient triggered events (11) associated with normal sinus rhythm    LHC 2022  Prox LAD to Mid LAD lesion is 10% stenosed. Previously placed Prox Cx to Mid Cx drug eluting stent is widely patent. Balloon angioplasty was performed. Prox RCA to Mid RCA lesion is 20% stenosed. Mid RCA to Dist RCA lesion is 30% stenosed. Mid RCA lesion is 30% stenosed. Non-stenotic Dist RCA lesion was previously treated. Ost RPDA to  RPDA lesion is 95% stenosed. Non-stenotic RPAV-1 lesion was previously treated. RPAV-2 lesion is 100% stenosed. Mid LAD lesion is 50% stenosed with 0% stenosed side branch in Ost 2nd Sept.     Physical Exam VS:  BP 100/80   Pulse 76   Ht 5' 11 (1.803 m)   Wt 164 lb 9.6 oz (74.7 kg)   SpO2 98%   BMI 22.96 kg/m        Wt Readings from Last 3 Encounters:  03/01/24 164 lb 9.6 oz (74.7 kg)  01/12/24 185 lb 6.4 oz (84.1 kg)  12/22/23 192 lb (87.1 kg)    GEN: Well nourished, well developed in no acute distress NECK: No JVD; No carotid bruits CARDIAC: RRR, no murmurs, rubs, gallops RESPIRATORY:  Clear to auscultation without rales, wheezing or rhonchi  ABDOMEN: Soft, non-tender, non-distended EXTREMITIES:  No edema; No deformity   ASSESSMENT AND PLAN  Unstable angina CAD with prior PCI Patient reports persistent unstable angina. It is mildly improved since he lost weight, but still present. Insurance denied LHC procedure and denied appeal made by Dr. Darron. Would undoubtedly prefer pursuing LHC at this point, but due to insurance opposition will order a Cardiac PET stress test. IT is likely patient will eventually need a LHC. Continue ASA, Zetia , Imdur  and Crestor .   HLD LDL 29. Lp (a) 13). Continue Crestor  and Zetia .   Dizziness Patient reports intermittent dizziness. Carotid US  showed mild disease on the right and none on the left. Heart monitor showed NSR, AVG HR 68bpm, 6 runs of SVT, 11 triggered events associated with NSR. Prior orthostatics were negative. Echo showed normal pump function and no significant valve disease. BP is borderline, which may be contributing, but patient says this is fairly normal. Recommended good hydration and oral intake. No further cardiac work-up at this time.     Dispo: Follow-up in 1 month  Signed, Federica Allport VEAR Fishman, PA-C

## 2024-03-07 ENCOUNTER — Encounter (INDEPENDENT_AMBULATORY_CARE_PROVIDER_SITE_OTHER): Payer: Self-pay

## 2024-03-07 ENCOUNTER — Other Ambulatory Visit (HOSPITAL_COMMUNITY): Payer: Self-pay

## 2024-03-07 ENCOUNTER — Other Ambulatory Visit: Payer: Self-pay

## 2024-03-07 NOTE — Progress Notes (Signed)
 Specialty Pharmacy Refill Coordination Note  Daniel Black is a 63 y.o. male contacted today regarding refills of specialty medication(s) Bictegravir-Emtricitab-Tenofov (Biktarvy )   Patient requested (Patient-Rptd) Delivery   Delivery date: 03/09/24   Verified address: (Patient-Rptd) [address] [place] [address]   Medication will be filled on: 03/08/24

## 2024-03-14 ENCOUNTER — Other Ambulatory Visit (HOSPITAL_COMMUNITY): Payer: Self-pay

## 2024-03-15 ENCOUNTER — Encounter (HOSPITAL_COMMUNITY): Payer: Self-pay

## 2024-03-15 ENCOUNTER — Other Ambulatory Visit (HOSPITAL_COMMUNITY): Payer: Self-pay

## 2024-03-15 ENCOUNTER — Other Ambulatory Visit (HOSPITAL_COMMUNITY): Payer: Self-pay | Admitting: *Deleted

## 2024-03-15 ENCOUNTER — Other Ambulatory Visit: Payer: Self-pay

## 2024-03-15 DIAGNOSIS — R0609 Other forms of dyspnea: Secondary | ICD-10-CM

## 2024-03-15 MED ORDER — BIKTARVY 50-200-25 MG PO TABS
1.0000 | ORAL_TABLET | Freq: Every day | ORAL | 11 refills | Status: AC
  Filled 2024-03-15 – 2024-04-12 (×2): qty 30, 30d supply, fill #0
  Filled 2024-05-12: qty 30, 30d supply, fill #1

## 2024-03-17 ENCOUNTER — Ambulatory Visit: Payer: Self-pay | Admitting: Medical

## 2024-03-17 ENCOUNTER — Ambulatory Visit
Admission: RE | Admit: 2024-03-17 | Discharge: 2024-03-17 | Disposition: A | Source: Ambulatory Visit | Attending: Medical

## 2024-03-17 DIAGNOSIS — R079 Chest pain, unspecified: Secondary | ICD-10-CM | POA: Diagnosis not present

## 2024-03-17 DIAGNOSIS — N281 Cyst of kidney, acquired: Secondary | ICD-10-CM | POA: Diagnosis not present

## 2024-03-17 LAB — NM PET CT CARDIAC PERFUSION MULTI W/ABSOLUTE BLOODFLOW
MBFR: 2.69
Nuc Rest EF: 51 %
Nuc Stress EF: 54 %
Peak HR: 63 {beats}/min
Rest HR: 62 {beats}/min
Rest MBF: 0.62 ml/g/min
Rest Nuclear Isotope Dose: 19.5 mCi
SRS: 0
SSS: 0
ST Depression (mm): 0 mm
Stress MBF: 1.67 ml/g/min
Stress Nuclear Isotope Dose: 19.2 mCi
TID: 1.02

## 2024-03-17 MED ORDER — RUBIDIUM RB82 GENERATOR (RUBYFILL)
25.0000 | PACK | Freq: Once | INTRAVENOUS | Status: AC
  Administered 2024-03-17: 19.23 via INTRAVENOUS

## 2024-03-17 MED ORDER — CAFFEINE CITRATE BASE COMPONENT 10 MG/ML IV SOLN
INTRAVENOUS | Status: AC
  Filled 2024-03-17: qty 3

## 2024-03-17 MED ORDER — DEXTROSE 5 % IV SOLN
INTRAVENOUS | Status: AC
  Filled 2024-03-17: qty 50

## 2024-03-17 MED ORDER — RUBIDIUM RB82 GENERATOR (RUBYFILL)
25.0000 | PACK | Freq: Once | INTRAVENOUS | Status: AC
  Administered 2024-03-17: 19.46 via INTRAVENOUS

## 2024-03-17 MED ORDER — REGADENOSON 0.4 MG/5ML IV SOLN
0.4000 mg | Freq: Once | INTRAVENOUS | Status: AC
  Administered 2024-03-17: 0.4 mg via INTRAVENOUS
  Filled 2024-03-17: qty 5

## 2024-03-17 MED ORDER — REGADENOSON 0.4 MG/5ML IV SOLN
INTRAVENOUS | Status: AC
  Filled 2024-03-17: qty 5

## 2024-03-22 ENCOUNTER — Ambulatory Visit: Attending: Medical | Admitting: Medical

## 2024-03-22 ENCOUNTER — Encounter: Payer: Self-pay | Admitting: Medical

## 2024-03-22 VITALS — BP 118/74 | HR 76 | Ht 71.0 in | Wt 167.8 lb

## 2024-03-22 DIAGNOSIS — E782 Mixed hyperlipidemia: Secondary | ICD-10-CM | POA: Diagnosis not present

## 2024-03-22 DIAGNOSIS — R42 Dizziness and giddiness: Secondary | ICD-10-CM

## 2024-03-22 DIAGNOSIS — I25110 Atherosclerotic heart disease of native coronary artery with unstable angina pectoris: Secondary | ICD-10-CM | POA: Diagnosis not present

## 2024-03-22 NOTE — Patient Instructions (Signed)
 Medication Instructions:  Your physician recommends that you continue on your current medications as directed. Please refer to the Current Medication list given to you today.    *If you need a refill on your cardiac medications before your next appointment, please call your pharmacy*  Lab Work: No labs ordered today    Testing/Procedures: No test ordered today   Follow-Up: At [facility], you and your health needs are our priority.  As part of our continuing mission to provide you with exceptional heart care, our providers are all part of one team.  This team includes your primary Cardiologist (physician) and Advanced Practice Providers or APPs (Physician Assistants and Nurse Practitioners) who all work together to provide you with the care you need, when you need it.  Your next appointment:   6 month(s)  Provider:   Timothy Gollan, MD or Cadence Franchester, PA-C

## 2024-03-22 NOTE — Progress Notes (Signed)
 Cardiology Office Note   Date:  03/22/2024  ID:  [name], DOB Mar 03, 1961, MRN [id number] PCP: Epifanio Alm SQUIBB, MD  [facility] Providers Cardiologist:  Evalene Lunger, MD   History of Present Illness Daniel Black is a 63 y.o. male with a history of CAD with prior PCI, hyperlipidemia, HIV on antivirals, chronic renal insufficiency who presents for follow-up of chest pain.   Patient has a history of CAD with MI 2004 with PCI to the LAD and mid RCA at that time, PCI in 2010 of the mid RCA lesion.  Cardiac catheterization in August 2019 with PCI/DES x 2 to the mid left circumflex.  Repeat heart cath in March 2022 recommended medical management.   The patient was seen 12/22/23 reporting intermittent chest tightness and SOB. LHC was ordered, but insurance denied this, so Cardiac PET stress test was ordered.  Cardiac PET stress showed small single segment of apical inferior infarction with no ischemia, low risk due to normal myocardial blood flow reserve.  EF was normal.  Today, the patient reports no further chest pain. He still has occasional SOB. He has been doing exercising normally with no symptoms.   Studies Reviewed      Cardiac PET stress 03/2024   Findings are consistent with small single segment of apical inferior infarction with no ischemia. The study is low risk due to normal myocardial blood flow reserve.   LV perfusion is abnormal. There is no evidence of ischemia. There is evidence of infarction. Defect 1: There is a single segment small defect with mild reduction in uptake present in the apical inferior location(s) that is fixed. There is abnormal wall motion in the defect area. Consistent with infarction.   Rest left ventricular function is normal. Rest EF: 51%. Stress left ventricular function is normal. Stress EF: 54%. End diastolic cavity size is normal. End systolic cavity size is normal. No evidence of transient ischemic dilation (TID) noted.    Myocardial blood flow was computed to be 0.62ml/g/min at rest and 1.67ml/g/min at stress. Global myocardial blood flow reserve was 2.69 and was normal.   Coronary calcium  assessment not performed due to prior revascularization.   Electronically signed by: Soyla DELENA Merck, MD  Carotid US  02/2024 Summary:  Right Carotid: Velocities in the right ICA are consistent with a 1-39%  stenosis.   Left Carotid: There is no evidence of stenosis in the left ICA.   Vertebrals:  Bilateral vertebral arteries demonstrate antegrade flow.  Subclavians: Normal flow hemodynamics were seen in bilateral subclavian               arteries.   *See table(s) above for measurements and observations.    Echo 02/2024 1. Left ventricular ejection fraction, by estimation, is 55 to 60%. Left  ventricular ejection fraction by 3D volume is 60 %. The left ventricle has  normal function. Left ventricular endocardial border not optimally defined  to evaluate regional wall  motion. Left ventricular diastolic parameters were normal. The average  left ventricular global longitudinal strain is -18.0 %. The global  longitudinal strain is normal.   2. Right ventricular systolic function is normal. The right ventricular  size is normal.   3. The mitral valve is normal in structure. No evidence of mitral valve  regurgitation. No evidence of mitral stenosis.   4. The aortic valve is tricuspid. Aortic valve regurgitation is not  visualized. No aortic stenosis is present.   5. Aortic dilatation noted. There is borderline dilatation  of the aortic  root, measuring 39 mm.   6. The inferior vena cava is normal in size with greater than 50%  respiratory variability, suggesting right atrial pressure of 3 mmHg.    Heart monitor 02/2024 Event monitor Patch Wear Time:  12 days and 2 hours (2025-10-11T16:37:17-0400 to 2025-10-23T19:03:03-399)   Normal sinus rhythm Patient had a min HR of 42 bpm, max HR of 148 bpm, and avg HR of 68  bpm.    6 Supraventricular Tachycardia runs occurred, the run with the fastest interval lasting 5 beats with a max rate of 148 bpm, the longest lasting 11.6 secs with an avg rate of 120 bpm. Some episodes of Supraventricular Tachycardia may be possible Atrial Tachycardia with variable block.    Isolated SVEs were rare (<1.0%), SVE Couplets were rare (<1.0%), and SVE Triplets were rare (<1.0%).    Isolated VEs were rare (<1.0%), VE Couplets were rare (<1.0%), and no VE Triplets were present. Ventricular Trigeminy was present.   Patient triggered events (11) associated with normal sinus rhythm     LHC 2022   Prox LAD to Mid LAD lesion is 10% stenosed. Previously placed Prox Cx to Mid Cx drug eluting stent is widely patent. Balloon angioplasty was performed. Prox RCA to Mid RCA lesion is 20% stenosed. Mid RCA to Dist RCA lesion is 30% stenosed. Mid RCA lesion is 30% stenosed. Non-stenotic Dist RCA lesion was previously treated. Ost RPDA to RPDA lesion is 95% stenosed. Non-stenotic RPAV-1 lesion was previously treated. RPAV-2 lesion is 100% stenosed. Mid LAD lesion is 50% stenosed with 0% stenosed side branch in Ost 2nd Sept.  Physical Exam VS:  BP 118/74 (BP Location: Left Arm, Patient Position: Sitting, Cuff Size: Normal)   Pulse 76   Ht 5' 11 (1.803 m)   Wt 167 lb 12.8 oz (76.1 kg)   SpO2 97%   BMI 23.40 kg/m        Wt Readings from Last 3 Encounters:  03/22/24 167 lb 12.8 oz (76.1 kg)  03/01/24 164 lb 9.6 oz (74.7 kg)  01/12/24 185 lb 6.4 oz (84.1 kg)    GEN: Well nourished, well developed in no acute distress NECK: No JVD; No carotid bruits CARDIAC: RRR, no murmurs, rubs, gallops RESPIRATORY:  Clear to auscultation without rales, wheezing or rhonchi  ABDOMEN: Soft, non-tender, non-distended EXTREMITIES:  No edema; No deformity   ASSESSMENT AND PLAN  CAD with prior PCI Recent Cardiac PET stress test showed apical inferior infarct with no ischemia, overall low risk  study.  Patient denies any further chest pain.  He has occasional shortness of breath on exertion.  He is exercising normally.  At this point, patient would like to defer any further testing.  We discussed increasing Imdur  versus adding Ranexa , however we will wait at this time.  We will really evaluate symptoms at follow-up. Continue ASA, Zetia , Imdur , and Crestor .  HLD LDL 29. LP (a) 13. Continue crestor  and Zetia .  Dizziness He has chronic unchanged dizziness. BP today is normal. Prior cardiac work-up was unremarkable.      Dispo: Follow-up in 6 months  Signed, Ica Daye VEAR Fishman, PA-C

## 2024-04-04 ENCOUNTER — Ambulatory Visit: Admitting: Medical

## 2024-04-11 ENCOUNTER — Other Ambulatory Visit (HOSPITAL_COMMUNITY): Payer: Self-pay

## 2024-04-11 ENCOUNTER — Encounter (HOSPITAL_COMMUNITY): Payer: Self-pay

## 2024-04-11 NOTE — Progress Notes (Signed)
 Specialty Pharmacy Refill Coordination Note  Daniel Black is a 64 y.o. male contacted today regarding refills of specialty medication(s) Bictegravir-Emtricitab-Tenofov (Biktarvy )   Patient requested Delivery   Delivery date: 04/14/24   Verified address: [address] [place] [id number]   Medication will be filled on: 04/13/24

## 2024-04-12 ENCOUNTER — Other Ambulatory Visit: Payer: Self-pay

## 2024-04-15 ENCOUNTER — Other Ambulatory Visit (HOSPITAL_COMMUNITY): Payer: Self-pay

## 2024-04-19 ENCOUNTER — Other Ambulatory Visit (HOSPITAL_COMMUNITY): Payer: Self-pay

## 2024-04-24 ENCOUNTER — Encounter: Payer: Self-pay | Admitting: Cardiovascular Disease

## 2024-04-25 MED ORDER — ROSUVASTATIN CALCIUM 40 MG PO TABS: 40.0000 mg | ORAL_TABLET | Freq: Every day | ORAL | 3 refills | Status: AC

## 2024-04-25 MED ORDER — EZETIMIBE 10 MG PO TABS: 10.0000 mg | ORAL_TABLET | Freq: Every day | ORAL | 3 refills | Status: AC

## 2024-04-25 MED ORDER — ISOSORBIDE MONONITRATE ER 30 MG PO TB24: 30.0000 mg | ORAL_TABLET | Freq: Every day | ORAL | 3 refills | Status: AC

## 2024-05-12 ENCOUNTER — Other Ambulatory Visit: Payer: Self-pay

## 2024-05-12 NOTE — Progress Notes (Signed)
 Specialty Pharmacy Refill Coordination Note  Daniel Black is a 64 y.o. male contacted today regarding refills of specialty medication(s) Bictegravir-Emtricitab-Tenofov (Biktarvy )   Patient requested Delivery   Delivery date: 05/16/24   Verified address: [address] [place] [id number]   Medication will be filled on: 05/13/24
